# Patient Record
Sex: Female | Born: 1945
Health system: Southern US, Community
[De-identification: ages and names within clinical notes are randomized; demographics above are authoritative.]

## PROBLEM LIST (undated history)

## (undated) DIAGNOSIS — I10 Essential (primary) hypertension: Secondary | ICD-10-CM

## (undated) DIAGNOSIS — F419 Anxiety disorder, unspecified: Secondary | ICD-10-CM

## (undated) DIAGNOSIS — M1990 Unspecified osteoarthritis, unspecified site: Secondary | ICD-10-CM

## (undated) DIAGNOSIS — C801 Malignant (primary) neoplasm, unspecified: Secondary | ICD-10-CM

## (undated) DIAGNOSIS — K219 Gastro-esophageal reflux disease without esophagitis: Secondary | ICD-10-CM

## (undated) DIAGNOSIS — D649 Anemia, unspecified: Secondary | ICD-10-CM

## (undated) DIAGNOSIS — E119 Type 2 diabetes mellitus without complications: Secondary | ICD-10-CM

## (undated) DIAGNOSIS — Z8719 Personal history of other diseases of the digestive system: Secondary | ICD-10-CM

## (undated) DIAGNOSIS — N390 Urinary tract infection, site not specified: Secondary | ICD-10-CM

## (undated) HISTORY — PX: CHOLECYSTECTOMY: SHX55

## (undated) HISTORY — PX: DILATION AND CURETTAGE OF UTERUS: SHX78

## (undated) HISTORY — PX: WISDOM TOOTH EXTRACTION: SHX21

---

## 1999-07-10 ENCOUNTER — Encounter: Payer: Self-pay | Admitting: Emergency Medicine

## 1999-07-10 ENCOUNTER — Emergency Department (HOSPITAL_COMMUNITY): Admission: EM | Admit: 1999-07-10 | Discharge: 1999-07-10 | Payer: Self-pay | Admitting: Emergency Medicine

## 1999-08-20 ENCOUNTER — Emergency Department (HOSPITAL_COMMUNITY): Admission: EM | Admit: 1999-08-20 | Discharge: 1999-08-21 | Payer: Self-pay | Admitting: Emergency Medicine

## 1999-09-03 ENCOUNTER — Ambulatory Visit (HOSPITAL_COMMUNITY): Admission: RE | Admit: 1999-09-03 | Discharge: 1999-09-03 | Payer: Self-pay | Admitting: Obstetrics and Gynecology

## 1999-09-03 ENCOUNTER — Encounter (INDEPENDENT_AMBULATORY_CARE_PROVIDER_SITE_OTHER): Payer: Self-pay

## 2000-03-24 ENCOUNTER — Encounter: Payer: Self-pay | Admitting: *Deleted

## 2000-03-24 ENCOUNTER — Ambulatory Visit (HOSPITAL_COMMUNITY): Admission: RE | Admit: 2000-03-24 | Discharge: 2000-03-24 | Payer: Self-pay | Admitting: *Deleted

## 2000-07-14 ENCOUNTER — Emergency Department (HOSPITAL_COMMUNITY): Admission: EM | Admit: 2000-07-14 | Discharge: 2000-07-14 | Payer: Self-pay | Admitting: Emergency Medicine

## 2008-05-06 ENCOUNTER — Emergency Department (HOSPITAL_COMMUNITY): Admission: EM | Admit: 2008-05-06 | Discharge: 2008-05-06 | Payer: Self-pay | Admitting: *Deleted

## 2008-05-06 IMAGING — CR DG KNEE COMPLETE 4+V*R*
5 series · 5 of 5 positions shown · non-contrast
Comparison: None.

CLINICAL DATA: Fell on concrete, pain

RIGHT KNEE - COMPLETE 4+ VIEW

[t knee ap right]
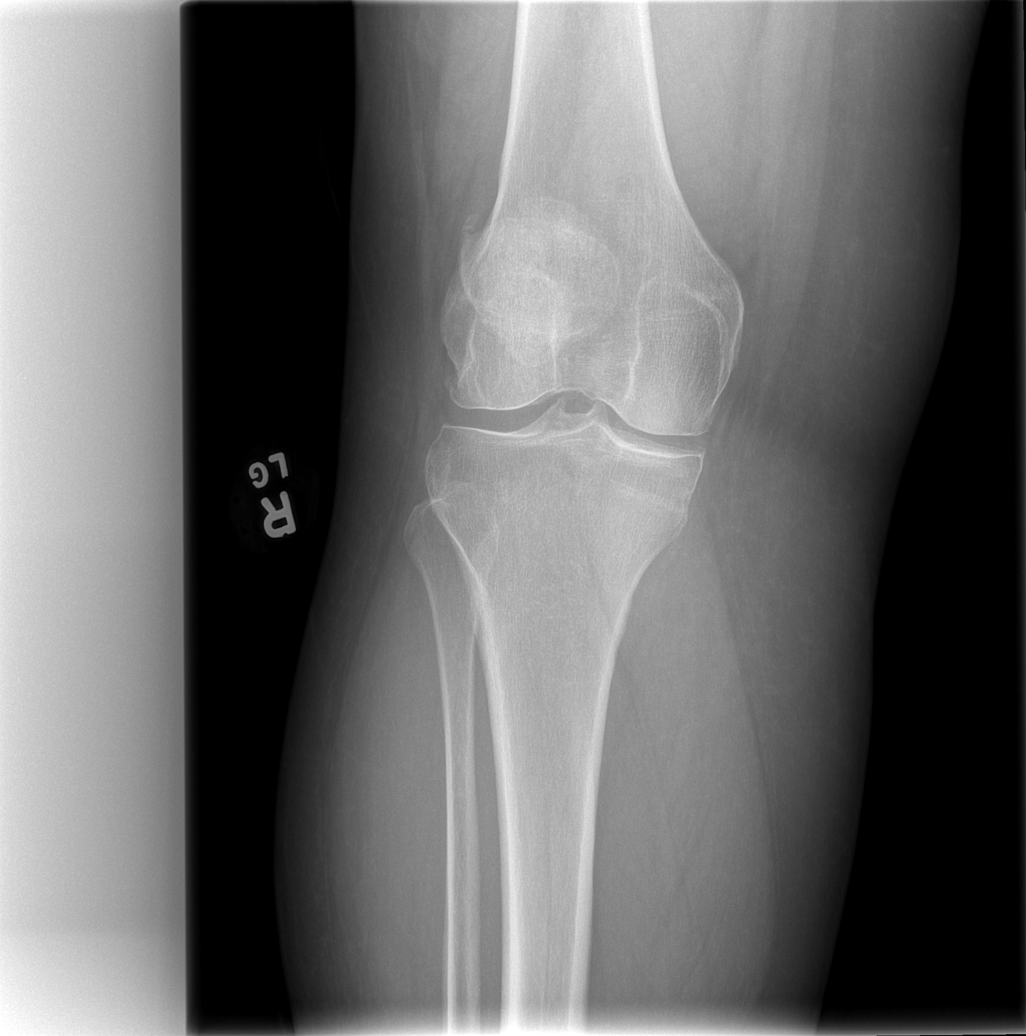

[t knee oblique right (1 of 2)]
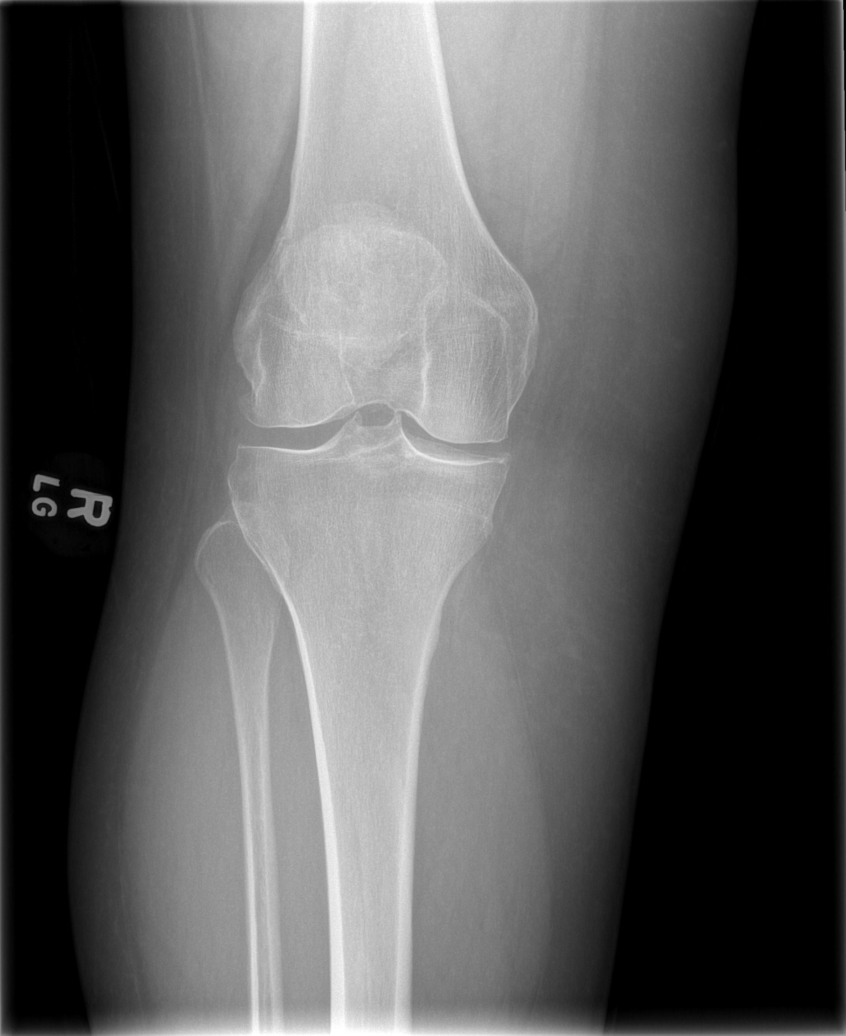

[t knee oblique right (2 of 2)]
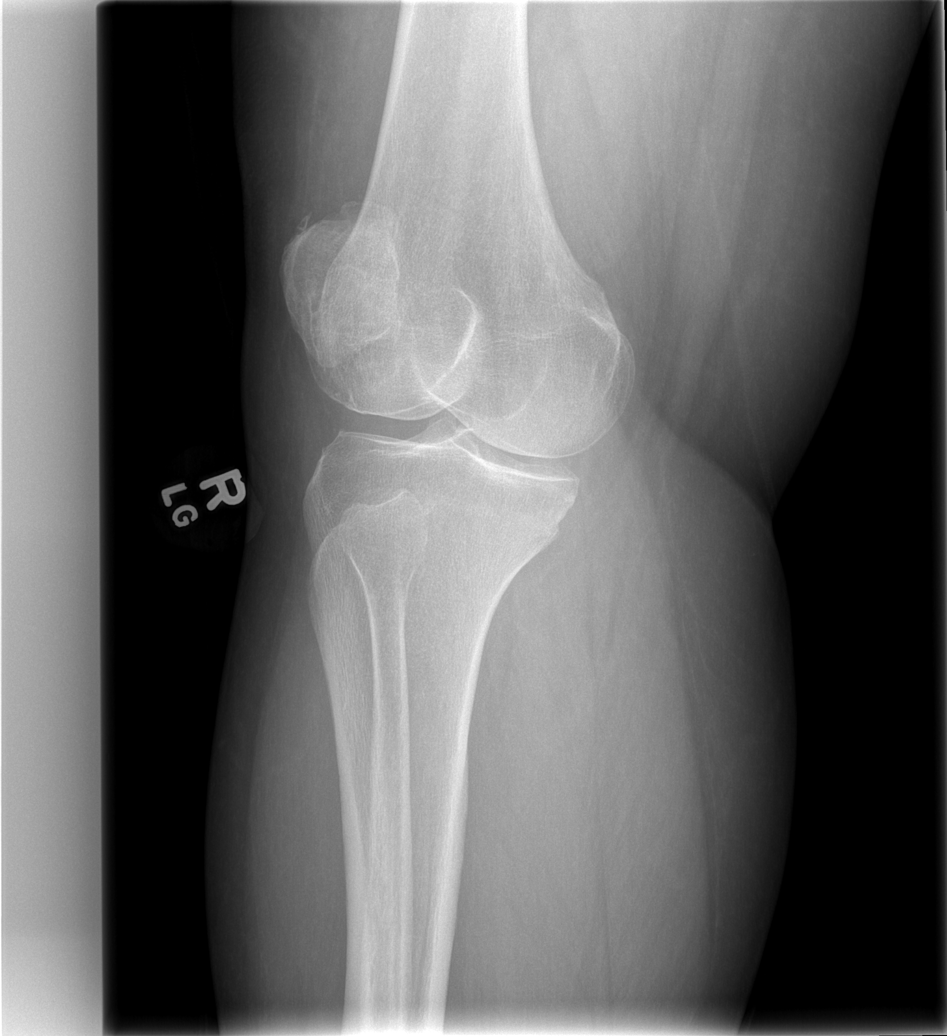

[t knee lat right (1 of 2)]
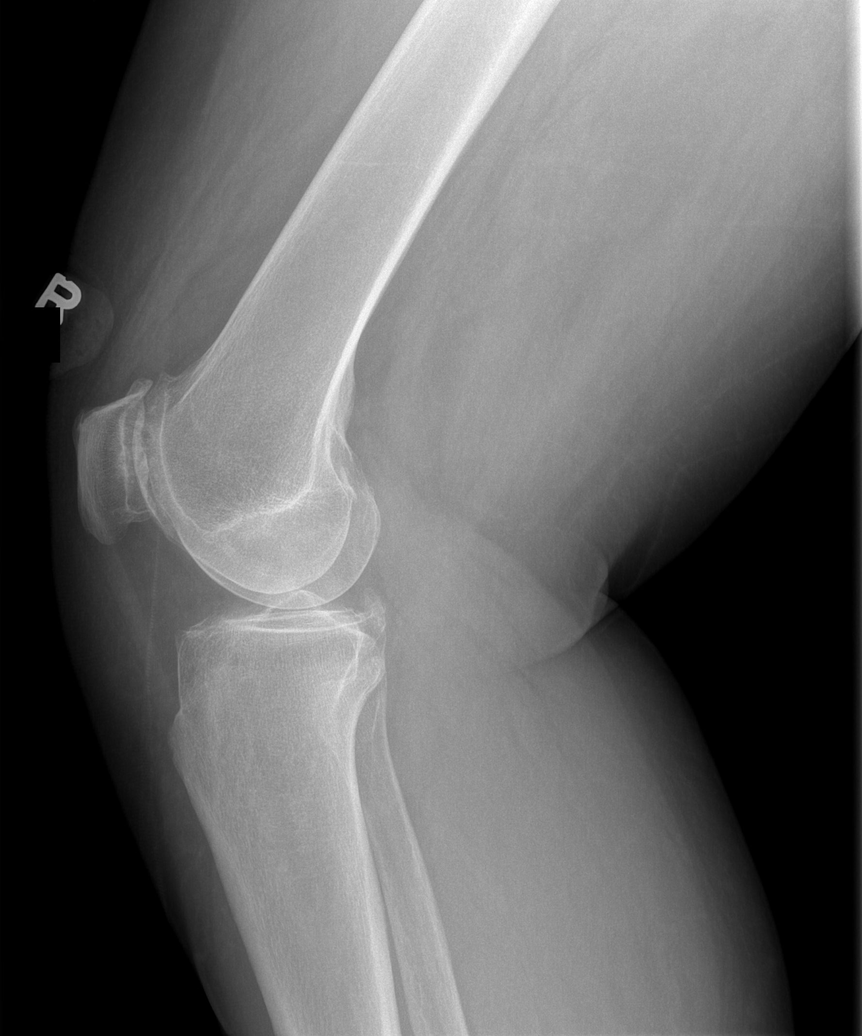

[t knee lat right (2 of 2)]
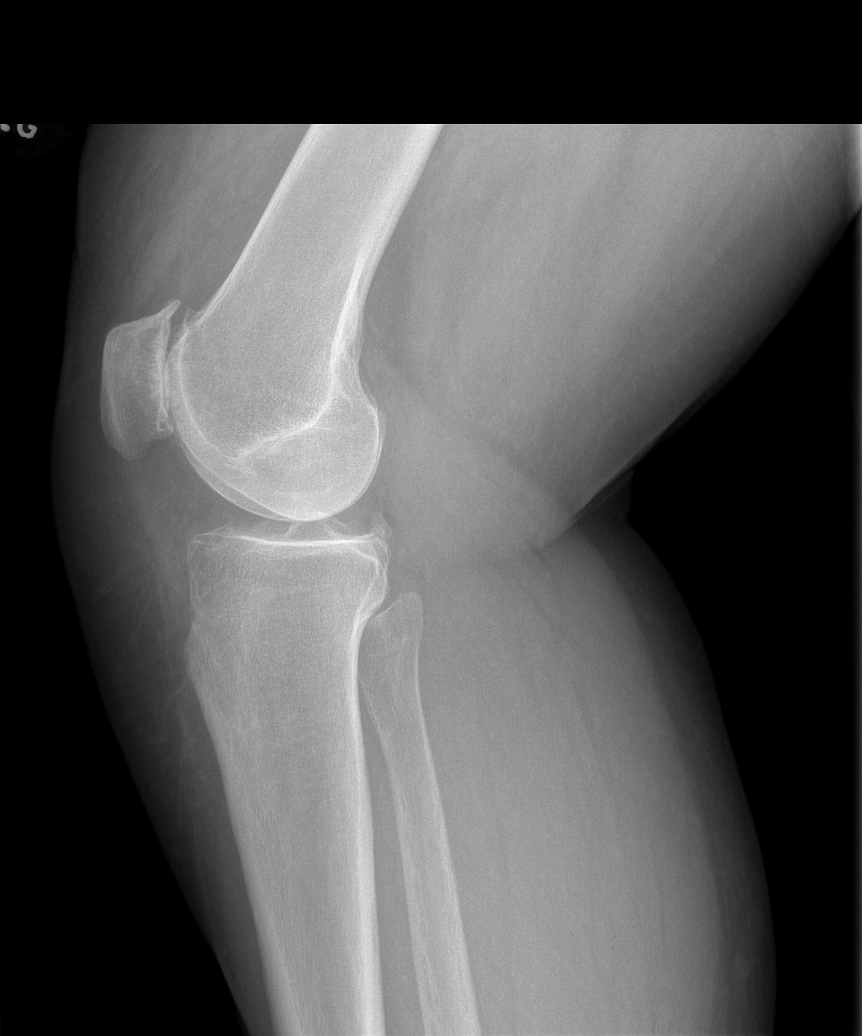

[5 of 5 positions shown; findings below may reference images not displayed]

FINDINGS: Advanced patellofemoral degenerative change.  Small
suprapatellar bursa effusion.  Mild medial compartment narrowing.
No visible fracture.
IMPRESSION: As above.  Small effusion.  Advanced degenerative change.  No
visible fracture.

## 2008-05-06 IMAGING — CR DG FOREARM 2V*L*
2 series · 2 of 2 positions shown · non-contrast
Comparison: None.

CLINICAL DATA: Fell on concrete, pain

LEFT FOREARM - 2 VIEW

[x forearm ap left]
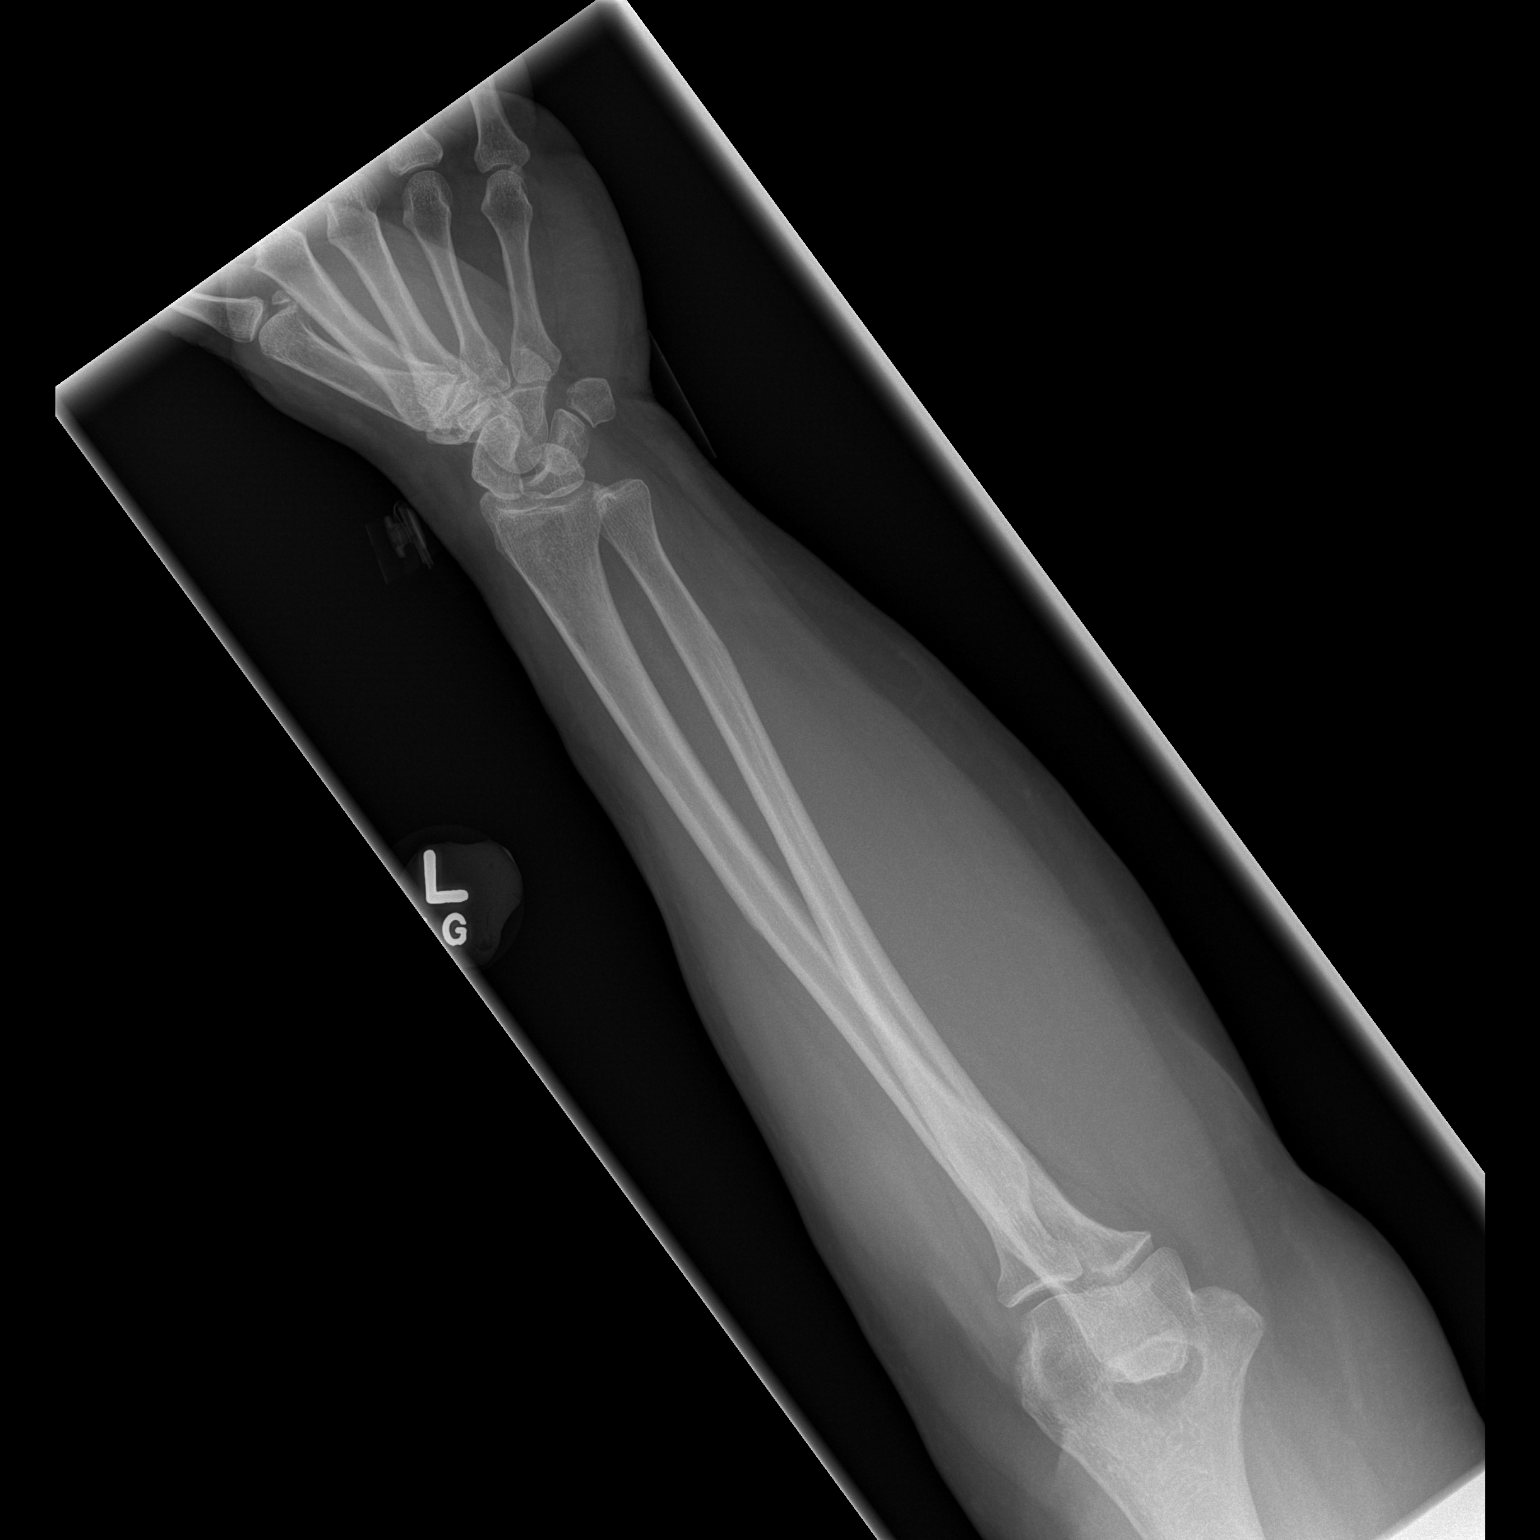

[x forearm lat left]
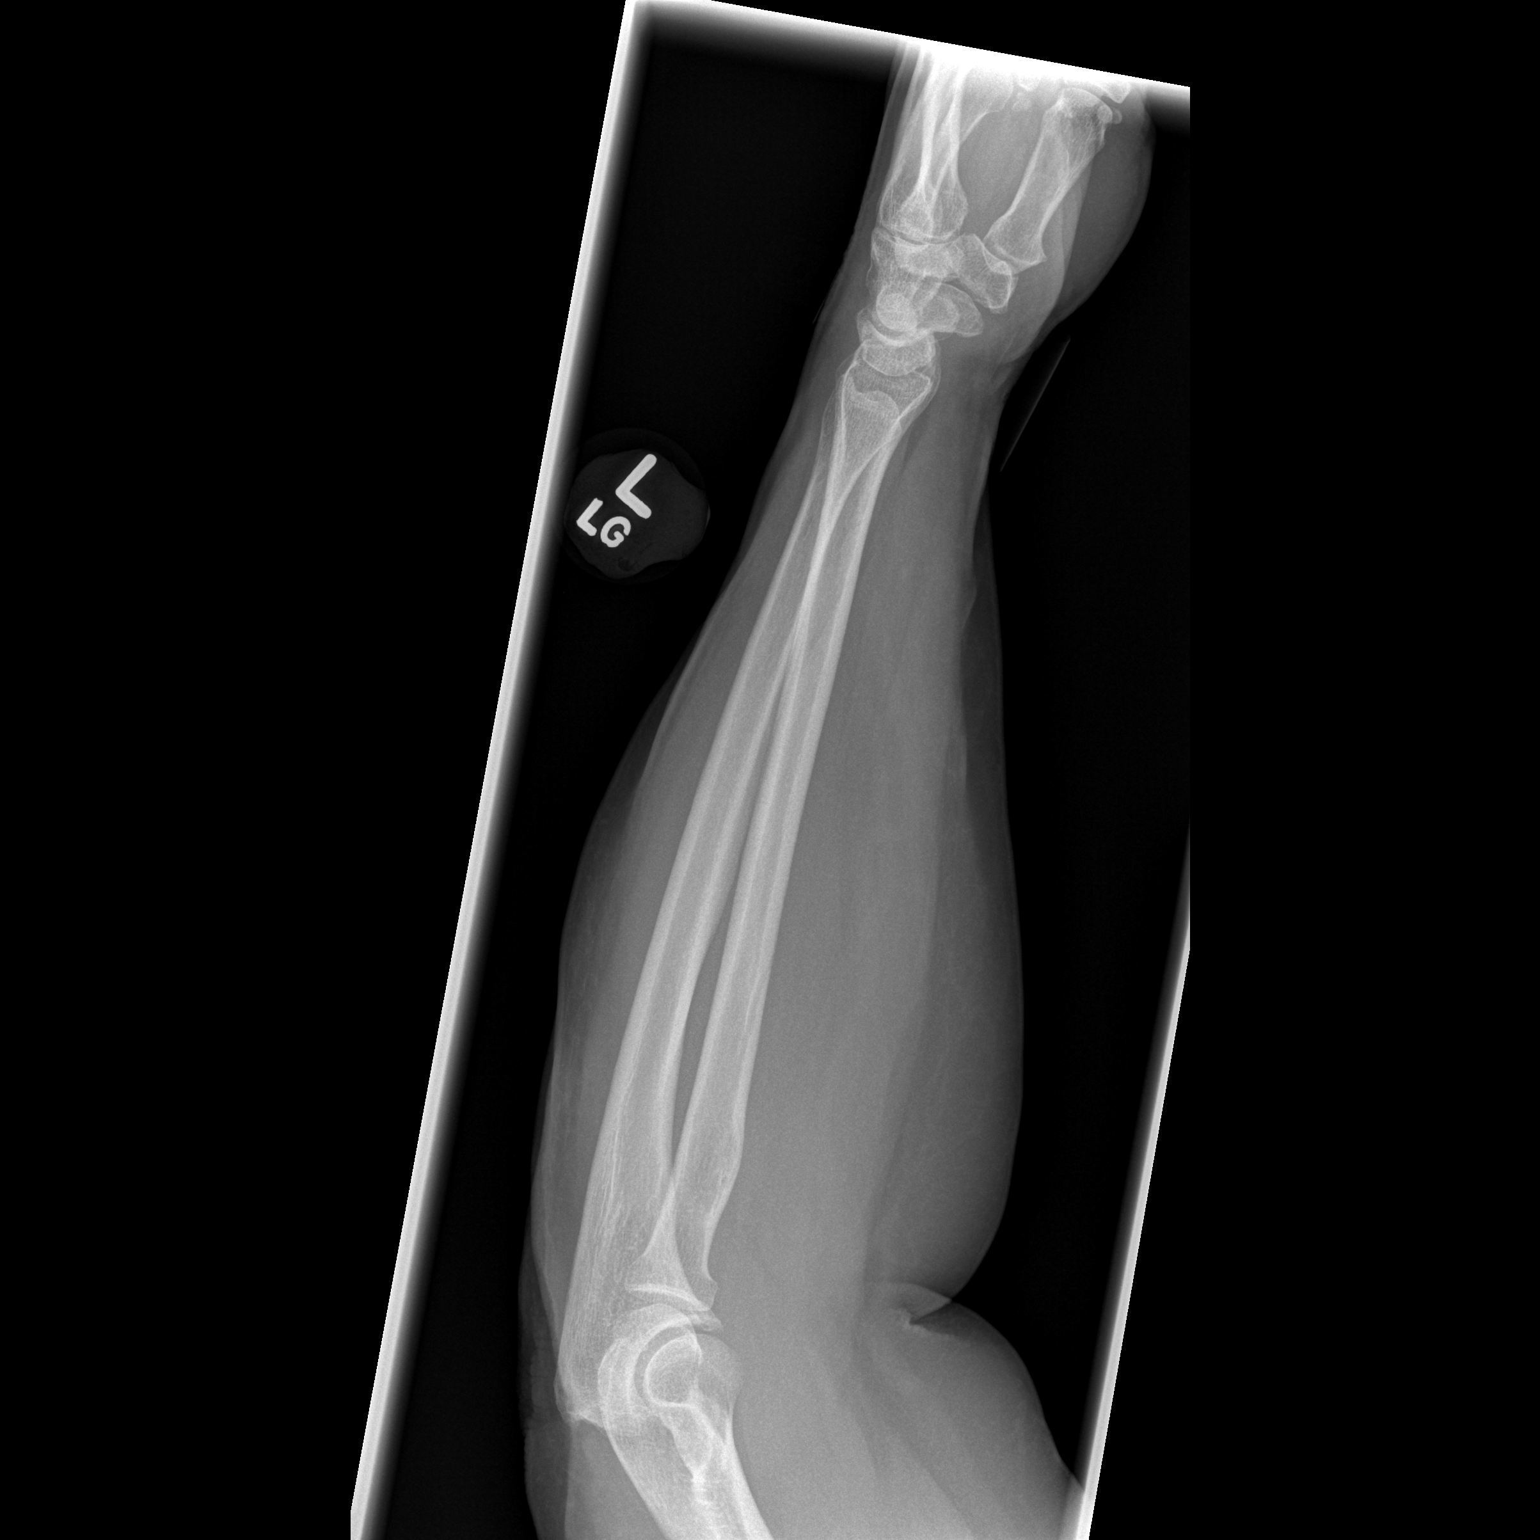

[2 of 2 positions shown; findings below may reference images not displayed]

FINDINGS: There is no evidence of fracture or dislocation.  There
is no evidence of arthropathy or other focal bony abnormality.
Soft tissue swelling is present without obvious foreign body.
IMPRESSION: No visible fracture.  Soft tissue swelling.

## 2010-06-05 NOTE — Op Note (Signed)
[facility]  Patient:    Meagan [name], Meagan [name]                     MRN: [id number] Proc. Date: 09/03/99 Adm. Date:  [id number] Disc. Date: [id number] Attending:  Lendon Colonel                           Operative Report  PREOPERATIVE DIAGNOSIS:       Menorrhagia with uterine fibroids.  POSTOPERATIVE DIAGNOSIS:      Menorrhagia with uterine fibroids.  OPERATION:                    Hysteroscopy with resection of two large                               submucosal myomas.  DESCRIPTION OF PROCEDURE:     The patient was placed in lithotomy position, prepped and draped in the usual fashion.  Cervix dilated quite easily, and the hysteroscope was inserted into a retroverted uterus.  Visualization of the uterus revealed two large fibroids, one anteriorly and one posteriorly.  These were resected without difficulty with the resectoscope and sent to the lab for study.  The uterus was then injected with a 30 cc ______ Pitressin solution. Meagan [name] tolerated this procedure well and was sent to recovery room in good condition. DD:  09/03/99 TD:  09/05/99 Job: [id number]

## 2011-11-22 DIAGNOSIS — N390 Urinary tract infection, site not specified: Secondary | ICD-10-CM

## 2011-11-22 HISTORY — DX: Urinary tract infection, site not specified: N39.0

## 2011-11-24 ENCOUNTER — Encounter (HOSPITAL_COMMUNITY): Payer: Self-pay | Admitting: Pharmacist

## 2011-11-25 ENCOUNTER — Encounter (HOSPITAL_COMMUNITY): Payer: Self-pay

## 2011-11-25 ENCOUNTER — Encounter (HOSPITAL_COMMUNITY)
Admission: RE | Admit: 2011-11-25 | Discharge: 2011-11-25 | Disposition: A | Discharge status: Home / Self Care | Payer: Managed Care, Other (non HMO) | Source: Ambulatory Visit | Attending: Obstetrics and Gynecology | Admitting: Obstetrics and Gynecology

## 2011-11-25 HISTORY — DX: Personal history of other diseases of the digestive system: Z87.19

## 2011-11-25 HISTORY — DX: Urinary tract infection, site not specified: N39.0

## 2011-11-25 HISTORY — DX: Essential (primary) hypertension: I10

## 2011-11-25 HISTORY — DX: Gastro-esophageal reflux disease without esophagitis: K21.9

## 2011-11-25 HISTORY — DX: Anxiety disorder, unspecified: F41.9

## 2011-11-25 HISTORY — DX: Type 2 diabetes mellitus without complications: E11.9

## 2011-11-25 HISTORY — DX: Anemia, unspecified: D64.9

## 2011-11-25 HISTORY — DX: Unspecified osteoarthritis, unspecified site: M19.90

## 2011-11-25 LAB — COMPREHENSIVE METABOLIC PANEL
AST: 16 U/L (ref 0–37)
Albumin: 3.6 g/dL (ref 3.5–5.2)
Alkaline Phosphatase: 80 U/L (ref 39–117)
BUN: 22 mg/dL (ref 6–23)
CO2: 26 mEq/L (ref 19–32)
Chloride: 99 mEq/L (ref 96–112)
Creatinine, Ser: 0.82 mg/dL (ref 0.50–1.10)
GFR calc non Af Amer: 73 mL/min — ABNORMAL LOW (ref >90)
Potassium: 3.5 mEq/L (ref 3.5–5.1)
Total Bilirubin: 0.5 mg/dL (ref 0.3–1.2)

## 2011-11-25 LAB — CBC
HCT: 42.7 % (ref 36.0–46.0)
MCV: 82.1 fL (ref 78.0–100.0)
RBC: 5.2 MIL/uL — ABNORMAL HIGH (ref 3.87–5.11)
WBC: 11.7 10*3/uL — ABNORMAL HIGH (ref 4.0–10.5)

## 2011-11-25 NOTE — Pre-Procedure Instructions (Signed)
Dr Brayton Caves informed that CBG 175, EKG ok.  No orders given.

## 2011-11-25 NOTE — Patient Instructions (Addendum)
   Your procedure is scheduled on: Tues., Nov 12  Enter through the Main Entrance of [facility] at: 830 am Pick up the phone at the desk and dial [phone] and inform us of your arrival.  Please call this number if you have any problems the morning of surgery: [phone]  Remember: Do not eat food after midnight:  Monday Do not drink clear liquids after:  Monday Take these medicines the morning of surgery with a SIP OF WATER: Withhold Metformin Monday night dose, take zantac with sip of water DOS.  All other meds are taken at night - will take Monday night.  Do not wear jewelry, make-up, or FINGER nail polish No metal in your hair or on your body. Do not wear lotions, powders, perfumes. You may wear deodorant.  Please use your CHG wash as directed prior to surgery.  Do not shave anywhere for at least 12 hours prior to first CHG shower.  Do not bring valuables to the hospital. Contacts, dentures or bridgework may not be worn into surgery.  Patients discharged on the day of surgery will not be allowed to drive home.  Home with husband Meagan [name]  cell [phone]/

## 2011-11-29 NOTE — H&P (Signed)
Meagan [name] is an 66 y.o. female.   Chief Complaint: 66 yo G1 P1 with vaginal bleeding since about June 2013.  HPI: Pt with previous frightening experience at gynecologist, and elects D & C over office biopsy.  Past Medical History  Diagnosis Date  . SVD (spontaneous vaginal delivery)     x 1  . UTI (lower urinary tract infection) 11/22/11    started abx on 11/24/11  . Diabetes mellitus without complication   . Hypertension   . Anxiety     no meds  . GERD (gastroesophageal reflux disease)     tx zantac  . H/O hiatal hernia   . Arthritis     knees  . Anemia     hx     Past Surgical History  Procedure Date  . Dilation and curettage of uterus   . Wisdom tooth extraction   . Cholecystectomy     No family history on file. Social History:  reports that she has never smoked. She has never used smokeless tobacco. She reports that she does not drink alcohol or use illicit drugs.  Allergies:  Allergies  Allergen Reactions  . Penicillins Swelling and Other (See Comments)    hallucinations  . Sinutab Sinus Max St (Phenylephrine-Acetaminophen) Swelling  . Valacyclovir Other (See Comments)    Confusion    No prescriptions prior to admission    No results found for this or any previous visit (from the past 48 hour(s)). No results found.  Review of Systems  All other systems reviewed and are negative.    Height 5' 2.25" (1.581 m), weight 83.462 kg (184 lb). Physical Exam  Constitutional: She is oriented to person, place, and time. She appears well-developed and well-nourished.  HENT:  Head: Normocephalic and atraumatic.  Eyes: Conjunctivae normal are normal.  Neck: Neck supple. No thyromegaly present.  Cardiovascular: Normal rate, regular rhythm and normal heart sounds.   Respiratory: Effort normal and breath sounds normal.  GI: Soft. Bowel sounds are normal.  Genitourinary: Vagina normal and uterus normal.  Musculoskeletal: Normal range of motion.  Neurological:  She is alert and oriented to person, place, and time.  Skin: Skin is warm and dry.  Psychiatric: She has a normal mood and affect. Her behavior is normal. Thought content normal.     Assessment/Plan 66 yo with post meno bleeding, for hysteroscopy, D & C.  Rai Severns P 11/29/2011, 10:57 AM

## 2011-11-30 ENCOUNTER — Ambulatory Visit (HOSPITAL_COMMUNITY)
Admission: RE | Admit: 2011-11-30 | Discharge: 2011-11-30 | Disposition: A | Discharge status: Home / Self Care | Payer: Managed Care, Other (non HMO) | Source: Ambulatory Visit | Attending: Obstetrics and Gynecology | Admitting: Obstetrics and Gynecology

## 2011-11-30 ENCOUNTER — Encounter (HOSPITAL_COMMUNITY): Payer: Self-pay | Admitting: Anesthesiology

## 2011-11-30 ENCOUNTER — Ambulatory Visit (HOSPITAL_COMMUNITY): Payer: Managed Care, Other (non HMO) | Admitting: Anesthesiology

## 2011-11-30 ENCOUNTER — Encounter (HOSPITAL_COMMUNITY): Payer: Self-pay | Admitting: *Deleted

## 2011-11-30 ENCOUNTER — Encounter (HOSPITAL_COMMUNITY)
Admission: RE | Disposition: A | Discharge status: Home / Self Care | Payer: Self-pay | Source: Ambulatory Visit | Attending: Obstetrics and Gynecology

## 2011-11-30 DIAGNOSIS — D250 Submucous leiomyoma of uterus: Secondary | ICD-10-CM | POA: Insufficient documentation

## 2011-11-30 DIAGNOSIS — Z01812 Encounter for preprocedural laboratory examination: Secondary | ICD-10-CM | POA: Insufficient documentation

## 2011-11-30 DIAGNOSIS — N950 Postmenopausal bleeding: Secondary | ICD-10-CM | POA: Insufficient documentation

## 2011-11-30 DIAGNOSIS — Z01818 Encounter for other preprocedural examination: Secondary | ICD-10-CM | POA: Insufficient documentation

## 2011-11-30 HISTORY — PX: DILATATION & CURRETTAGE/HYSTEROSCOPY WITH RESECTOCOPE: SHX5572

## 2011-11-30 LAB — GLUCOSE, CAPILLARY: Glucose-Capillary: 182 mg/dL — ABNORMAL HIGH (ref 70–99)

## 2011-11-30 SURGERY — DILATATION & CURETTAGE/HYSTEROSCOPY WITH RESECTOCOPE
Anesthesia: General | Site: Uterus | Wound class: Clean Contaminated

## 2011-11-30 MED ORDER — KETOROLAC TROMETHAMINE 30 MG/ML IJ SOLN
INTRAMUSCULAR | Status: AC
  Filled 2011-11-30: qty 1

## 2011-11-30 MED ORDER — INSULIN ASPART 100 UNIT/ML ~~LOC~~ SOLN
2.0000 [IU] | Freq: Once | SUBCUTANEOUS | Status: AC
  Administered 2011-11-30: 2 [IU] via SUBCUTANEOUS

## 2011-11-30 MED ORDER — FAMOTIDINE IN NACL 20-0.9 MG/50ML-% IV SOLN
20.0000 mg | Freq: Two times a day (BID) | INTRAVENOUS | Status: DC
  Administered 2011-11-30: 20 mg via INTRAVENOUS
  Filled 2011-11-30 (×4): qty 50

## 2011-11-30 MED ORDER — MIDAZOLAM HCL 2 MG/2ML IJ SOLN: 0.5000 mg | Freq: Once | INTRAMUSCULAR | Status: DC | PRN

## 2011-11-30 MED ORDER — LIDOCAINE HCL (CARDIAC) 20 MG/ML IV SOLN
INTRAVENOUS | Status: DC | PRN
  Administered 2011-11-30 (×2): 50 mg via INTRAVENOUS

## 2011-11-30 MED ORDER — PROMETHAZINE HCL 25 MG/ML IJ SOLN: 6.2500 mg | INTRAMUSCULAR | Status: DC | PRN

## 2011-11-30 MED ORDER — ONDANSETRON HCL 4 MG/2ML IJ SOLN
INTRAMUSCULAR | Status: DC | PRN
  Administered 2011-11-30: 4 mg via INTRAVENOUS

## 2011-11-30 MED ORDER — OXYCODONE HCL 5 MG PO TABS: 5.0000 mg | ORAL_TABLET | ORAL | Status: DC | PRN

## 2011-11-30 MED ORDER — KETOROLAC TROMETHAMINE 30 MG/ML IJ SOLN
INTRAMUSCULAR | Status: DC | PRN
  Administered 2011-11-30: 30 mg via INTRAVENOUS

## 2011-11-30 MED ORDER — GLYCINE 1.5 % IR SOLN
Status: DC | PRN
  Administered 2011-11-30: 3000 mL

## 2011-11-30 MED ORDER — MIDAZOLAM HCL 5 MG/5ML IJ SOLN
INTRAMUSCULAR | Status: DC | PRN
  Administered 2011-11-30: 2 mg via INTRAVENOUS

## 2011-11-30 MED ORDER — ONDANSETRON HCL 4 MG/2ML IJ SOLN: 4.0000 mg | Freq: Four times a day (QID) | INTRAMUSCULAR | Status: DC | PRN

## 2011-11-30 MED ORDER — PROPOFOL 10 MG/ML IV EMUL
INTRAVENOUS | Status: DC | PRN
  Administered 2011-11-30: 200 mg via INTRAVENOUS

## 2011-11-30 MED ORDER — INSULIN ASPART 100 UNIT/ML ~~LOC~~ SOLN
SUBCUTANEOUS | Status: AC
  Administered 2011-11-30: 2 [IU] via SUBCUTANEOUS
  Filled 2011-11-30: qty 1

## 2011-11-30 MED ORDER — KETOROLAC TROMETHAMINE 30 MG/ML IJ SOLN: 15.0000 mg | Freq: Once | INTRAMUSCULAR | Status: DC | PRN

## 2011-11-30 MED ORDER — FENTANYL CITRATE 0.05 MG/ML IJ SOLN
INTRAMUSCULAR | Status: DC | PRN
  Administered 2011-11-30: 100 ug via INTRAVENOUS

## 2011-11-30 MED ORDER — PROPOFOL 10 MG/ML IV EMUL
INTRAVENOUS | Status: AC
  Filled 2011-11-30: qty 20

## 2011-11-30 MED ORDER — ONDANSETRON HCL 4 MG/2ML IJ SOLN
INTRAMUSCULAR | Status: AC
  Filled 2011-11-30: qty 2

## 2011-11-30 MED ORDER — LACTATED RINGERS IV SOLN
INTRAVENOUS | Status: DC
  Administered 2011-11-30 (×3): via INTRAVENOUS

## 2011-11-30 MED ORDER — MEPERIDINE HCL 25 MG/ML IJ SOLN: 6.2500 mg | INTRAMUSCULAR | Status: DC | PRN

## 2011-11-30 MED ORDER — INSULIN REGULAR HUMAN 100 UNIT/ML IJ SOLN: 2.0000 [IU] | Freq: Once | INTRAMUSCULAR | Status: DC

## 2011-11-30 MED ORDER — LIDOCAINE HCL 1 % IJ SOLN
INTRAMUSCULAR | Status: DC | PRN
  Administered 2011-11-30: 20 mL

## 2011-11-30 MED ORDER — LIDOCAINE HCL (CARDIAC) 20 MG/ML IV SOLN
INTRAVENOUS | Status: AC
  Filled 2011-11-30: qty 5

## 2011-11-30 MED ORDER — SODIUM CHLORIDE 0.9 % IV SOLN: 250.0000 mL | INTRAVENOUS | Status: DC | PRN

## 2011-11-30 MED ORDER — FENTANYL CITRATE 0.05 MG/ML IJ SOLN: 25.0000 ug | INTRAMUSCULAR | Status: DC | PRN

## 2011-11-30 MED ORDER — FENTANYL CITRATE 0.05 MG/ML IJ SOLN
INTRAMUSCULAR | Status: AC
  Filled 2011-11-30: qty 2

## 2011-11-30 MED ORDER — MIDAZOLAM HCL 2 MG/2ML IJ SOLN
INTRAMUSCULAR | Status: AC
  Filled 2011-11-30: qty 2

## 2011-11-30 SURGICAL SUPPLY — 20 items
CANISTER SUCTION 2500CC (MISCELLANEOUS) ×3 IMPLANT
CATH ROBINSON RED A/P 16FR (CATHETERS) ×3 IMPLANT
CONTAINER PREFILL 10% NBF 60ML (FORM) ×4 IMPLANT
CORD ACTIVE DISPOSABLE (ELECTRODE) ×1
CORD ELECTRO ACTIVE DISP (ELECTRODE) ×1 IMPLANT
DRESSING TELFA 8X3 (GAUZE/BANDAGES/DRESSINGS) ×3 IMPLANT
ELECT LOOP GYNE PRO 24FR (CUTTING LOOP) ×3
ELECT REM PT RETURN 9FT ADLT (ELECTROSURGICAL) ×3
ELECT VAPORTRODE GRVD BAR (ELECTRODE) IMPLANT
ELECTRODE LOOP GYNE PRO 24FR (CUTTING LOOP) ×1 IMPLANT
ELECTRODE REM PT RTRN 9FT ADLT (ELECTROSURGICAL) ×1 IMPLANT
GLOVE BIOGEL PI IND STRL 7.0 (GLOVE) ×4 IMPLANT
GLOVE BIOGEL PI INDICATOR 7.0 (GLOVE) ×2
GLOVE ECLIPSE 6.5 STRL STRAW (GLOVE) ×3 IMPLANT
GOWN STRL REIN XL XLG (GOWN DISPOSABLE) ×9 IMPLANT
LOOP ANGLED CUTTING 22FR (CUTTING LOOP) IMPLANT
PACK HYSTEROSCOPY LF (CUSTOM PROCEDURE TRAY) ×3 IMPLANT
PAD OB MATERNITY 4.3X12.25 (PERSONAL CARE ITEMS) ×3 IMPLANT
TOWEL OR 17X24 6PK STRL BLUE (TOWEL DISPOSABLE) ×6 IMPLANT
WATER STERILE IRR 1000ML POUR (IV SOLUTION) ×3 IMPLANT

## 2011-11-30 NOTE — Anesthesia Postprocedure Evaluation (Signed)
Anesthesia Post Note  Patient: Meagan [name]  Procedure(s) Performed: Procedure(s) (LRB): DILATATION & CURETTAGE/HYSTEROSCOPY WITH RESECTOCOPE (N/A)  Anesthesia type: General  Patient location: PACU  Post pain: Pain level controlled  Post assessment: Post-op Vital signs reviewed  Last Vitals:  Filed Vitals:   11/30/11 1105  BP: 142/60  Pulse: 87  Temp: 36.3 C  Resp: 18    Post vital signs: Reviewed  Level of consciousness: sedated  Complications: No apparent anesthesia complicationsfj

## 2011-11-30 NOTE — Transfer of Care (Signed)
Immediate Anesthesia Transfer of Care Note  Patient: Meagan [name]  Procedure(s) Performed: Procedure(s) (LRB) with comments: DILATATION & CURETTAGE/HYSTEROSCOPY WITH RESECTOCOPE (N/A)  Patient Location: PACU  Anesthesia Type:General  Level of Consciousness: awake, alert  and oriented  Airway & Oxygen Therapy: Patient Spontanous Breathing and Patient connected to nasal cannula oxygen  Post-op Assessment: Report given to PACU RN and Post -op Vital signs reviewed and stable  Post vital signs: Reviewed and stable  Complications: No apparent anesthesia complications

## 2011-11-30 NOTE — Anesthesia Procedure Notes (Addendum)
Performed by: Isabella Bowens R   Procedure Name: LMA Insertion Date/Time: 11/30/2011 11:10 AM Performed by: Gertie Fey Pre-anesthesia Checklist: Patient identified, Timeout performed, Emergency Drugs available, Suction available and Patient being monitored Patient Re-evaluated:Patient Re-evaluated prior to inductionOxygen Delivery Method: Circle system utilized Preoxygenation: Pre-oxygenation with 100% oxygen Intubation Type: IV induction LMA: LMA inserted LMA Size: 4.0 Grade View: Grade III Number of attempts: 1 Placement Confirmation: positive ETCO2 and breath sounds checked- equal and bilateral Tube secured with: Tape Dental Injury: Teeth and Oropharynx as per pre-operative assessment

## 2011-11-30 NOTE — Interval H&P Note (Signed)
History and Physical Interval Note:  11/30/2011 9:50 AM  Meagan [name]  has presented today for surgery, with the diagnosis of Post Menopausal Bleeding  The various methods of treatment have been discussed with the patient and family. After consideration of risks, benefits and other options for treatment, the patient has consented to  Procedure(s) (LRB) with comments: DILATATION AND CURETTAGE /HYSTEROSCOPY (N/A) as a surgical intervention .  The patient's history has been reviewed, patient examined, no change in status, stable for surgery.  I have reviewed the patient's chart and labs.  Questions were answered to the patient's satisfaction.     ROMINE,CYNTHIA P

## 2011-11-30 NOTE — Anesthesia Preprocedure Evaluation (Addendum)
Anesthesia Evaluation  Patient identified by MRN, date of birth, ID band Patient awake    Reviewed: Allergy & Precautions, H&P , Patient's Chart, lab work & pertinent test results, reviewed documented beta blocker date and time   History of Anesthesia Complications Negative for: history of anesthetic complications  Airway Mallampati: III TM Distance: >3 FB Neck ROM: full    Dental No notable dental hx.    Pulmonary neg pulmonary ROS,  breath sounds clear to auscultation  Pulmonary exam normal       Cardiovascular Exercise Tolerance: Good hypertension, negative cardio ROS  Rhythm:regular Rate:Normal     Neuro/Psych negative neurological ROS  negative psych ROS   GI/Hepatic negative GI ROS, Neg liver ROS, hiatal hernia, GERD-  Controlled,  Endo/Other  negative endocrine ROSdiabetes  Renal/GU negative Renal ROS     Musculoskeletal   Abdominal   Peds  Hematology negative hematology ROS (+)   Anesthesia Other Findings SVD (spontaneous vaginal delivery)   x 1 UTI (lower urinary tract infection) 11/22/11 started abx on 11/24/11    Diabetes mellitus without complication     Hypertension        Anxiety   no meds GERD (gastroesophageal reflux disease)   tx zantac    H/O hiatal hernia     Arthritis   knees    Anemia   hx              Reproductive/Obstetrics negative OB ROS                           Anesthesia Physical Anesthesia Plan  ASA: III  Anesthesia Plan: General LMA   Post-op Pain Management:    Induction:   Airway Management Planned:   Additional Equipment:   Intra-op Plan:   Post-operative Plan:   Informed Consent: I have reviewed the patients History and Physical, chart, labs and discussed the procedure including the risks, benefits and alternatives for the proposed anesthesia with the patient or authorized representative who has indicated his/her understanding and acceptance.    Dental Advisory Given  Plan Discussed with: CRNA and Surgeon  Anesthesia Plan Comments:        Anesthesia Quick Evaluation

## 2011-11-30 NOTE — Op Note (Signed)
Preoperative diagnosis: Postmenopausal bleeding Postoperative diagnosis: Same, large submucous fibroids, path pending Procedure: Diagnostic hysteroscopy: Operative hysteroscopy with dissection of submucous myomas Anesthesia: Gen. by LMA Estimated blood loss: Minimal Glycine deficit: 170 cc Complications: None Procedure: The patient was taken to the operating room and after induction of adequate general anesthesia, was placed in the low dorsolithotomy position and prepped and draped in usual fashion.  Posterior weighted and anterior sims retractors were placed, and the cervix was grasped on the anterior lip with a single-tooth tenaculum.  The uterus sounded to 6 cm.  The cervix was dilated to a #21 Shawnie Pons.  The diagnostic hysteroscope was introduced using glycine as a distention medium with the pressure pump set at 80 mm of mercury.  Upon hysteroscopy there were 2 rather large submucous fibroids noted.  Scope was withdrawn and changed out for an operative scope.  The cervix was dilated to #31 Shawnie Pons, and the operative scope was introduced with a single loop cautery.  Cautery was used to resect the submucous myomas in multiple pieces.  Gentle sharp curettage was then done.  The specimen was sent to pathology.  The instruments were removed from the vagina, and the procedure was terminated.  The patient tolerated it well, and went in satisfactory condition to post anesthesia recovery.  Sponge needle and instrument counts were correct x3.

## 2011-12-01 ENCOUNTER — Encounter (HOSPITAL_COMMUNITY): Payer: Self-pay | Admitting: Obstetrics and Gynecology

## 2011-12-08 ENCOUNTER — Ambulatory Visit: Payer: Managed Care, Other (non HMO) | Attending: Gynecologic Oncology | Admitting: Gynecologic Oncology

## 2011-12-08 ENCOUNTER — Encounter: Payer: Self-pay | Admitting: Gynecologic Oncology

## 2011-12-08 VITALS — BP 134/62 | HR 80 | Temp 98.4°F | Resp 16 | Ht 62.68 in | Wt 189.0 lb

## 2011-12-08 DIAGNOSIS — I152 Hypertension secondary to endocrine disorders: Secondary | ICD-10-CM | POA: Insufficient documentation

## 2011-12-08 DIAGNOSIS — I10 Essential (primary) hypertension: Secondary | ICD-10-CM | POA: Insufficient documentation

## 2011-12-08 DIAGNOSIS — C549 Malignant neoplasm of corpus uteri, unspecified: Secondary | ICD-10-CM | POA: Insufficient documentation

## 2011-12-08 DIAGNOSIS — Z8542 Personal history of malignant neoplasm of other parts of uterus: Secondary | ICD-10-CM | POA: Insufficient documentation

## 2011-12-08 DIAGNOSIS — E119 Type 2 diabetes mellitus without complications: Secondary | ICD-10-CM

## 2011-12-08 DIAGNOSIS — C541 Malignant neoplasm of endometrium: Secondary | ICD-10-CM

## 2011-12-08 NOTE — Patient Instructions (Signed)
Please keep your pre-op visit.

## 2011-12-08 NOTE — Progress Notes (Signed)
Consult Note: Gyn-Onc  Meagan [name] 66 y.o. female  CC:  Chief Complaint  Patient presents with  . Endo ca    New patient    HPI: Patient is seen today in consultation at the request of Dr. Romine for newly diagnosed endometrial cancer.    Should is a 66-year-old gravida 1 para 1 has been having some vaginal bleeding sincerely part of 2013. She states the bleeding became more pronounced and was bright red starting in June of 2013. It was scant it was a sister with a slight amount of cramping. She has a history of her last menstrual period being in her 50s. She states her cycles just stopped she never had any vasomotor symptoms. She never took any hormone replacement therapy. She had a prior difficult experience with a gynecologist therefore she wished to proceed with a D&C rather than undergoing an endometrial biopsy in the office.   She subsequently underwent a hysteroscopy D&C on November 12 with Dr. Romine. Operative findings included a large submucosal fibroid. The uterus sounded to 6 cm. Final pathology was consistent with a grade 1 endometrioid adenocarcinoma. It is for this reason that she came in to see us today.  She states that since her procedure she's really only had a bit of pink discharge and less bleeding than she had before. She denies any pain.  Review of Systems: She has a chest pain, shortness of breath, nausea, vomiting, fevers, chills. She has to use to flight of stairs or house and cons without any shortness of breath. She has been on metformin for the past 3 months. She states she checks her sugars are in the 200s but of course vary depending on what she has eaten. She denies any unintentional weight loss weight gain any headaches or visual changes. 10 point review of systems is otherwise negative.  Current Meds:  Outpatient Encounter Prescriptions as of 12/08/2011  Medication Sig Dispense Refill  . doxazosin (CARDURA) 2 MG tablet Take 2 mg by mouth at bedtime.       . metFORMIN (GLUCOPHAGE) 500 MG tablet Take 500 mg by mouth daily with breakfast.      . ranitidine (ZANTAC) 300 MG tablet Take 300 mg by mouth 2 (two) times daily.      . triamterene-hydrochlorothiazide (MAXZIDE-25) 37.5-25 MG per tablet Take 1 tablet by mouth daily.        Allergy:  Allergies  Allergen Reactions  . Penicillins Swelling and Other (See Comments)    hallucinations  . Shrimp (Shellfish Allergy) Nausea And Vomiting  . Sinutab Sinus Max St (Phenylephrine-Acetaminophen) Swelling  . Valacyclovir Other (See Comments)    Confusion    Social Hx:  Married. 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 She has a son who is 39 and a grandson who is 4. History   Social History  . Marital Status: Married    Spouse Name: N/A    Number of Children: N/A  . Years of Education: N/A   Occupational History  . Not on file.   Social History Main Topics  . Smoking status: Never Smoker   . Smokeless tobacco: Never Used  . Alcohol Use: No  . Drug Use: No  . Sexually Active: Yes    Birth Control/ Protection: None     Comment: husband had a vasectomy   Other Topics Concern  . Not on file   Social History Narrative  . No narrative on file    Past Surgical   Hx: Cholecystectomy was via laparotomy. Past Surgical History  Procedure Date  . Dilation and curettage of uterus   . Wisdom tooth extraction   . Cholecystectomy   . Dilatation & currettage/hysteroscopy with resectocope 11/30/2011    Procedure: DILATATION & CURETTAGE/HYSTEROSCOPY WITH RESECTOCOPE;  Surgeon: Cynthia P Romine, MD;  Location: [facility];  Service: Gynecology;  Laterality: N/A;    Past Medical Hx: Has never had a mammogram. She's never had a colonoscopy. Past Medical History  Diagnosis Date  . SVD (spontaneous vaginal delivery)     x 1  . UTI (lower urinary tract infection) 11/22/11    started abx on 11/24/11  . Diabetes mellitus without complication   . Hypertension   . Anxiety     no meds    . GERD (gastroesophageal reflux disease)     tx zantac  . H/O hiatal hernia   . Arthritis     knees  . Anemia     hx     Family Hx:  Family History  Problem Relation Age of Onset  . Skin cancer Sister   . Cancer Paternal Aunt     Vitals:  Blood pressure 134/62, pulse 80, temperature 98.4 F (36.9 C), resp. rate 16, height 5' 2.68" (1.592 m), weight 189 lb (85.73 kg).  Physical Exam: Well-developed female in no acute distress. BMI is 34. Neck: Supple, no lymphadenopathy, no thyromegaly.  Lungs: Clear to auscultation bilaterally.  Cardiovascular: Regular rate and rhythm.  Abdomen: Morbidly obese. His well-healed right upper quadrant incision. Abdomen is soft, nondistended tender, nondistended. There is no palpable masses or hepatosplenomegaly. Exam is limited by habitus.  Groins: No lymphadenopathy.  Extremities: 1+ nonpitting edema equal bilaterally on the medial aspect of her ankles bilaterally. Next  Pelvic: Normal external fetal genitalia. The cervix is somewhat atrophic. The cervix is visualized. This is tenaculum site on the anterior lip is healing well. There is no gross visible lesions. Bimanual examination reveals that uterus to be of normal size shape and consistency. There is no adnexal masses. Exam is somewhat limited by habitus.  Assessment/Plan: 66-year-old with a clinical stage I grade 1 endometrioid adenocarcinoma diagnosed at the time of D&C.  I had a lengthy discussion with the patient and her husband regarding her cancer. I discussed with them the definitive treatment included a painter oophorectomy. He also noted that the uterus we sent for frozen section. If at the time of frozen section there is deeper myometrial invasion (greater than 50%) were greater greatly to proceed with lymphadenectomy. If there's less than 50% myometrial invasion on frozen section the tumor appears to be a grade 1 the procedure will be concluded. I do think it is worthwhile trying to  proceed with her surgery to minimally invasive fashion. I discussed with them that based on her habitus as well as her prior surgery there is probably a 5% risk of conversion to difficulty with visualization.  Risks of surgery including but not limited to bleeding, infection, injury to surrounding organs, and thromboembolic disease were discussed with the patient. Their questions were elicited in answer to their satisfaction. They wish to proceed with surgery.  We'll tentatively scheduled her surgery for next Tuesday, November 26. She understands if it is able to be done in a minimally invasive fashion and if  she meets all postoperative goals, she will be able to go home on  postoperative day #1and she will not require Lovenox. If we need to proceed with a laparotomy she social be in   the hospital 2-3 days she'll be discharged home on Lovenox for 4 weeks. She'll be seen in the pre-care suite. They'll call us if they have questions prior to her surgery.    Tramain Gershman A., MD 12/08/2011, 11:20 AM   

## 2011-12-10 ENCOUNTER — Encounter (HOSPITAL_COMMUNITY): Payer: Self-pay | Admitting: Pharmacy Technician

## 2011-12-10 ENCOUNTER — Encounter (HOSPITAL_COMMUNITY): Payer: Self-pay

## 2011-12-10 ENCOUNTER — Encounter (HOSPITAL_COMMUNITY)
Admission: RE | Admit: 2011-12-10 | Discharge: 2011-12-10 | Disposition: A | Discharge status: Home / Self Care | Payer: Managed Care, Other (non HMO) | Source: Ambulatory Visit | Attending: Gynecologic Oncology | Admitting: Gynecologic Oncology

## 2011-12-10 HISTORY — DX: Malignant (primary) neoplasm, unspecified: C80.1

## 2011-12-10 LAB — HEPATIC FUNCTION PANEL
ALT: 21 U/L (ref 0–35)
AST: 18 U/L (ref 0–37)
Albumin: 3.7 g/dL (ref 3.5–5.2)
Alkaline Phosphatase: 89 U/L (ref 39–117)
Indirect Bilirubin: 0.4 mg/dL (ref 0.3–0.9)
Total Protein: 8.2 g/dL (ref 6.0–8.3)

## 2011-12-10 LAB — BASIC METABOLIC PANEL
CO2: 26 mEq/L (ref 19–32)
Calcium: 9.8 mg/dL (ref 8.4–10.5)
Creatinine, Ser: 0.73 mg/dL (ref 0.50–1.10)
GFR calc Af Amer: >90 mL/min (ref >90)

## 2011-12-10 LAB — DIFFERENTIAL
Eosinophils Absolute: 0.7 10*3/uL (ref 0.0–0.7)
Eosinophils Relative: 7 % — ABNORMAL HIGH (ref 0–5)
Lymphs Abs: 2.5 10*3/uL (ref 0.7–4.0)
Monocytes Absolute: 0.5 10*3/uL (ref 0.1–1.0)
Monocytes Relative: 5 % (ref 3–12)

## 2011-12-10 LAB — URINALYSIS, ROUTINE W REFLEX MICROSCOPIC
Glucose, UA: NEGATIVE mg/dL
Ketones, ur: NEGATIVE mg/dL
Leukocytes, UA: NEGATIVE
Protein, ur: NEGATIVE mg/dL

## 2011-12-10 LAB — CBC
MCH: 27.1 pg (ref 26.0–34.0)
MCV: 80.4 fL (ref 78.0–100.0)
Platelets: 273 10*3/uL (ref 150–400)
RDW: 13.4 % (ref 11.5–15.5)

## 2011-12-10 NOTE — Pre-Procedure Instructions (Signed)
Pt's husband Baldo Ash -notified that his wife is to be on clear liquid diet day before surgery - all day Monday  12/13/11.  List of clear liquids reviewed with him.  Clear liquids diet ordered by Dr. Duard Brady.  He voiced understanding.

## 2011-12-10 NOTE — Pre-Procedure Instructions (Signed)
PREOP ORDERS NOW IN EPIC FROM DR. GEHRIG--LAB NOTIFIED TO ADD ON DIFF AND LIVER PROFILE AS DR. Duard Brady ORDERED CBC WITH DIFF AND CMET.

## 2011-12-10 NOTE — Patient Instructions (Signed)
YOUR SURGERY IS SCHEDULED AT [facility]  ON:   Tuesday  11/26  AT 10:30 AM  REPORT TO [facility] AT:  8:00 AM      PHONE # FOR SHORT STAY IS [phone]  DO NOT EAT OR DRINK ANYTHING AFTER MIDNIGHT THE NIGHT BEFORE YOUR SURGERY.  YOU MAY BRUSH YOUR TEETH, RINSE OUT YOUR MOUTH--BUT NO WATER, NO FOOD, NO CHEWING GUM, NO MINTS, NO CANDIES, NO CHEWING TOBACCO.  PLEASE TAKE THE FOLLOWING MEDICATIONS THE AM OF YOUR SURGERY WITH A FEW SIPS OF WATER:  RANITIDINE   IF YOU USE INHALERS--USE YOUR INHALERS THE AM OF YOUR SURGERY AND BRING INHALERS TO THE HOSPITAL -TAKE TO SURGERY.    IF YOU ARE DIABETIC:  DO NOT TAKE ANY DIABETIC MEDICATIONS THE AM OF YOUR SURGERY.  IF YOU TAKE INSULIN IN THE EVENINGS--PLEASE ONLY TAKE 1/2 NORMAL EVENING DOSE THE NIGHT BEFORE YOUR SURGERY.  NO INSULIN THE AM OF YOUR SURGERY.  IF YOU HAVE SLEEP APNEA AND USE CPAP OR BIPAP--PLEASE BRING THE MASK AND THE TUBING.  DO NOT BRING YOUR MACHINE.  AFTER SURGERY-REMEMBER TO DO LEG EXERCISES AND TURN FREQUENTLY WHEN IN BED--DO DEEP BREATHING EXERCISES EVERY COUPLE OF HOURS TO PREVENT PNEUMONIA OR OTHER LUNG COMPLICATIONS.  DO NOT BRING VALUABLES, MONEY, CREDIT CARDS.  DO NOT WEAR JEWELRY, MAKE-UP, NAIL POLISH AND NO METAL PINS OR CLIPS IN YOUR HAIR. CONTACT LENS, DENTURES / PARTIALS, GLASSES SHOULD NOT BE WORN TO SURGERY AND IN MOST CASES-HEARING AIDS WILL NEED TO BE REMOVED.  BRING YOUR GLASSES CASE, ANY EQUIPMENT NEEDED FOR YOUR CONTACT LENS. FOR PATIENTS ADMITTED TO THE HOSPITAL--CHECK OUT TIME THE DAY OF DISCHARGE IS 11:00 AM.  ALL INPATIENT ROOMS ARE PRIVATE - WITH BATHROOM, TELEPHONE, TELEVISION AND WIFI INTERNET.  IF YOU ARE BEING DISCHARGED THE SAME DAY OF YOUR SURGERY--YOU CAN NOT DRIVE YOURSELF HOME--AND SHOULD NOT GO HOME ALONE BY TAXI OR BUS.  NO DRIVING OR OPERATING MACHINERY FOR 24 HOURS FOLLOWING ANESTHESIA / PAIN MEDICATIONS.  PLEASE MAKE ARRANGEMENTS FOR SOMEONE TO BE WITH YOU AT HOME  THE FIRST 24 HOURS AFTER SURGERY. RESPONSIBLE DRIVER'S NAME___________________________                                               PHONE #   _______________________                                  PLEASE READ OVER ANY  FACT SHEETS THAT YOU WERE GIVEN: MRSA INFORMATION

## 2011-12-10 NOTE — Pre-Procedure Instructions (Signed)
CBC, BMET, UA, T/S WERE DONE TODAY - PREOP -AT [facility] AS PER ANESTHESIOLOGIST'S GUIDELINES--NO PREOP ORDERS IN EPIC YET FROM DR. GEHRIG.  PT WILL SIGN CONSENT FOR SURGERY DAY OF HER SURGERY.  PT HAS EKG REPORT IN EPIC -DONE 11/25/11.  PT HAS CXR REPORT ON HER CHART FROM DR. W. HARRIS--IT WAS DONE 10/19/11.

## 2011-12-14 ENCOUNTER — Encounter (HOSPITAL_COMMUNITY)
Admission: RE | Disposition: A | Discharge status: Home / Self Care | Payer: Self-pay | Source: Ambulatory Visit | Attending: Obstetrics & Gynecology

## 2011-12-14 ENCOUNTER — Ambulatory Visit (HOSPITAL_COMMUNITY): Payer: Managed Care, Other (non HMO) | Admitting: Anesthesiology

## 2011-12-14 ENCOUNTER — Ambulatory Visit (HOSPITAL_COMMUNITY)
Admission: RE | Admit: 2011-12-14 | Discharge: 2011-12-15 | Disposition: A | Discharge status: Home / Self Care | Payer: Managed Care, Other (non HMO) | Source: Ambulatory Visit | Attending: Obstetrics & Gynecology | Admitting: Obstetrics & Gynecology

## 2011-12-14 ENCOUNTER — Encounter (HOSPITAL_COMMUNITY): Payer: Self-pay | Admitting: *Deleted

## 2011-12-14 ENCOUNTER — Encounter (HOSPITAL_COMMUNITY): Payer: Self-pay | Admitting: Anesthesiology

## 2011-12-14 DIAGNOSIS — Z8542 Personal history of malignant neoplasm of other parts of uterus: Secondary | ICD-10-CM | POA: Diagnosis present

## 2011-12-14 DIAGNOSIS — K219 Gastro-esophageal reflux disease without esophagitis: Secondary | ICD-10-CM | POA: Insufficient documentation

## 2011-12-14 DIAGNOSIS — C549 Malignant neoplasm of corpus uteri, unspecified: Secondary | ICD-10-CM | POA: Insufficient documentation

## 2011-12-14 DIAGNOSIS — N8000 Endometriosis of the uterus, unspecified: Secondary | ICD-10-CM | POA: Insufficient documentation

## 2011-12-14 DIAGNOSIS — K449 Diaphragmatic hernia without obstruction or gangrene: Secondary | ICD-10-CM | POA: Insufficient documentation

## 2011-12-14 DIAGNOSIS — C541 Malignant neoplasm of endometrium: Secondary | ICD-10-CM

## 2011-12-14 DIAGNOSIS — I10 Essential (primary) hypertension: Secondary | ICD-10-CM | POA: Insufficient documentation

## 2011-12-14 DIAGNOSIS — Z79899 Other long term (current) drug therapy: Secondary | ICD-10-CM | POA: Insufficient documentation

## 2011-12-14 DIAGNOSIS — Z87440 Personal history of urinary (tract) infections: Secondary | ICD-10-CM | POA: Insufficient documentation

## 2011-12-14 DIAGNOSIS — E119 Type 2 diabetes mellitus without complications: Secondary | ICD-10-CM | POA: Insufficient documentation

## 2011-12-14 DIAGNOSIS — D259 Leiomyoma of uterus, unspecified: Secondary | ICD-10-CM | POA: Insufficient documentation

## 2011-12-14 HISTORY — PX: ROBOTIC ASSISTED TOTAL HYSTERECTOMY WITH BILATERAL SALPINGO OOPHERECTOMY: SHX6086

## 2011-12-14 HISTORY — PX: LYMPH NODE DISSECTION: SHX5087

## 2011-12-14 LAB — GLUCOSE, CAPILLARY
Glucose-Capillary: 205 mg/dL — ABNORMAL HIGH (ref 70–99)
Glucose-Capillary: 190 mg/dL — ABNORMAL HIGH (ref 70–99)

## 2011-12-14 SURGERY — ROBOTIC ASSISTED TOTAL HYSTERECTOMY WITH BILATERAL SALPINGO OOPHORECTOMY
Anesthesia: General | Site: Abdomen | Wound class: Clean Contaminated

## 2011-12-14 MED ORDER — TRIAMTERENE-HCTZ 37.5-25 MG PO TABS
1.0000 | ORAL_TABLET | Freq: Every day | ORAL | Status: DC
  Administered 2011-12-14: 1 via ORAL
  Filled 2011-12-14 (×2): qty 1

## 2011-12-14 MED ORDER — ONDANSETRON HCL 4 MG/2ML IJ SOLN
INTRAMUSCULAR | Status: DC | PRN
  Administered 2011-12-14: 4 mg via INTRAVENOUS

## 2011-12-14 MED ORDER — GLYCOPYRROLATE 0.2 MG/ML IJ SOLN
INTRAMUSCULAR | Status: DC | PRN
  Administered 2011-12-14: .6 mg via INTRAVENOUS

## 2011-12-14 MED ORDER — KETOROLAC TROMETHAMINE 30 MG/ML IJ SOLN
15.0000 mg | Freq: Four times a day (QID) | INTRAMUSCULAR | Status: AC
  Administered 2011-12-14 – 2011-12-15 (×2): 15 mg via INTRAVENOUS

## 2011-12-14 MED ORDER — LACTATED RINGERS IV SOLN
INTRAVENOUS | Status: DC | PRN
  Administered 2011-12-14: 500 mL via INTRAVENOUS

## 2011-12-14 MED ORDER — ONDANSETRON HCL 4 MG/2ML IJ SOLN
4.0000 mg | Freq: Four times a day (QID) | INTRAMUSCULAR | Status: DC | PRN
  Administered 2011-12-14: 4 mg via INTRAVENOUS
  Filled 2011-12-14: qty 2

## 2011-12-14 MED ORDER — LACTATED RINGERS IV SOLN
INTRAVENOUS | Status: DC
  Administered 2011-12-14: 14:00:00 via INTRAVENOUS
  Administered 2011-12-14: 1000 mL via INTRAVENOUS
  Administered 2011-12-14: 11:00:00 via INTRAVENOUS

## 2011-12-14 MED ORDER — HYDROMORPHONE HCL PF 1 MG/ML IJ SOLN
INTRAMUSCULAR | Status: AC
  Filled 2011-12-14: qty 1

## 2011-12-14 MED ORDER — INSULIN ASPART 100 UNIT/ML ~~LOC~~ SOLN
3.0000 [IU] | Freq: Once | SUBCUTANEOUS | Status: AC
  Administered 2011-12-14: 3 [IU] via SUBCUTANEOUS

## 2011-12-14 MED ORDER — SUCCINYLCHOLINE CHLORIDE 20 MG/ML IJ SOLN
INTRAMUSCULAR | Status: DC | PRN
  Administered 2011-12-14: 100 mg via INTRAVENOUS

## 2011-12-14 MED ORDER — KCL IN DEXTROSE-NACL 20-5-0.45 MEQ/L-%-% IV SOLN
INTRAVENOUS | Status: DC
  Administered 2011-12-14 – 2011-12-15 (×2): via INTRAVENOUS
  Filled 2011-12-14 (×4): qty 1000

## 2011-12-14 MED ORDER — MIDAZOLAM HCL 5 MG/5ML IJ SOLN
INTRAMUSCULAR | Status: DC | PRN
  Administered 2011-12-14: 2 mg via INTRAVENOUS

## 2011-12-14 MED ORDER — OXYCODONE HCL 5 MG/5ML PO SOLN
5.0000 mg | Freq: Once | ORAL | Status: DC | PRN
  Filled 2011-12-14: qty 5

## 2011-12-14 MED ORDER — OXYCODONE-ACETAMINOPHEN 5-325 MG PO TABS
1.0000 | ORAL_TABLET | ORAL | Status: DC | PRN
  Administered 2011-12-14 – 2011-12-15 (×2): 2 via ORAL
  Filled 2011-12-14 (×2): qty 2

## 2011-12-14 MED ORDER — FAMOTIDINE 20 MG PO TABS
20.0000 mg | ORAL_TABLET | Freq: Two times a day (BID) | ORAL | Status: DC
  Administered 2011-12-14: 20 mg via ORAL
  Filled 2011-12-14 (×4): qty 1

## 2011-12-14 MED ORDER — CIPROFLOXACIN IN D5W 400 MG/200ML IV SOLN
400.0000 mg | INTRAVENOUS | Status: AC
  Administered 2011-12-14: 400 mg via INTRAVENOUS

## 2011-12-14 MED ORDER — METRONIDAZOLE IN NACL 5-0.79 MG/ML-% IV SOLN
500.0000 mg | INTRAVENOUS | Status: AC
  Administered 2011-12-14: 500 mg via INTRAVENOUS

## 2011-12-14 MED ORDER — PROPOFOL 10 MG/ML IV BOLUS
INTRAVENOUS | Status: DC | PRN
  Administered 2011-12-14: 120 mg via INTRAVENOUS

## 2011-12-14 MED ORDER — METRONIDAZOLE IN NACL 5-0.79 MG/ML-% IV SOLN
INTRAVENOUS | Status: AC
  Filled 2011-12-14: qty 100

## 2011-12-14 MED ORDER — HYDROMORPHONE HCL PF 1 MG/ML IJ SOLN
0.2500 mg | INTRAMUSCULAR | Status: DC | PRN
  Administered 2011-12-14 (×2): 0.5 mg via INTRAVENOUS

## 2011-12-14 MED ORDER — CIPROFLOXACIN IN D5W 400 MG/200ML IV SOLN
INTRAVENOUS | Status: AC
  Filled 2011-12-14: qty 200

## 2011-12-14 MED ORDER — DOXAZOSIN MESYLATE 2 MG PO TABS
2.0000 mg | ORAL_TABLET | Freq: Every day | ORAL | Status: DC
  Administered 2011-12-14: 2 mg via ORAL
  Filled 2011-12-14 (×2): qty 1

## 2011-12-14 MED ORDER — ZOLPIDEM TARTRATE 5 MG PO TABS: 5.0000 mg | ORAL_TABLET | Freq: Every evening | ORAL | Status: DC | PRN

## 2011-12-14 MED ORDER — ACETAMINOPHEN 10 MG/ML IV SOLN
INTRAVENOUS | Status: AC
  Filled 2011-12-14: qty 100

## 2011-12-14 MED ORDER — KETOROLAC TROMETHAMINE 30 MG/ML IJ SOLN
15.0000 mg | Freq: Four times a day (QID) | INTRAMUSCULAR | Status: AC
  Filled 2011-12-14 (×2): qty 1

## 2011-12-14 MED ORDER — INSULIN ASPART 100 UNIT/ML ~~LOC~~ SOLN
0.0000 [IU] | SUBCUTANEOUS | Status: DC
  Administered 2011-12-14: 5 [IU] via SUBCUTANEOUS
  Administered 2011-12-14 – 2011-12-15 (×3): 3 [IU] via SUBCUTANEOUS
  Administered 2011-12-15: 5 [IU] via SUBCUTANEOUS

## 2011-12-14 MED ORDER — OXYCODONE HCL 5 MG PO TABS: 5.0000 mg | ORAL_TABLET | Freq: Once | ORAL | Status: DC | PRN

## 2011-12-14 MED ORDER — INSULIN GLARGINE 100 UNIT/ML ~~LOC~~ SOLN
10.0000 [IU] | Freq: Every day | SUBCUTANEOUS | Status: DC
  Administered 2011-12-14: 10 [IU] via SUBCUTANEOUS

## 2011-12-14 MED ORDER — ACETAMINOPHEN 10 MG/ML IV SOLN: 1000.0000 mg | Freq: Once | INTRAVENOUS | Status: DC | PRN

## 2011-12-14 MED ORDER — MORPHINE SULFATE 2 MG/ML IJ SOLN: 2.0000 mg | INTRAMUSCULAR | Status: DC | PRN

## 2011-12-14 MED ORDER — ROCURONIUM BROMIDE 100 MG/10ML IV SOLN
INTRAVENOUS | Status: DC | PRN
  Administered 2011-12-14: 40 mg via INTRAVENOUS
  Administered 2011-12-14: 10 mg via INTRAVENOUS

## 2011-12-14 MED ORDER — FENTANYL CITRATE 0.05 MG/ML IJ SOLN
INTRAMUSCULAR | Status: DC | PRN
  Administered 2011-12-14 (×6): 50 ug via INTRAVENOUS
  Administered 2011-12-14: 100 ug via INTRAVENOUS
  Administered 2011-12-14: 50 ug via INTRAVENOUS

## 2011-12-14 MED ORDER — INSULIN ASPART 100 UNIT/ML ~~LOC~~ SOLN
SUBCUTANEOUS | Status: AC
  Filled 2011-12-14: qty 1

## 2011-12-14 MED ORDER — NEOSTIGMINE METHYLSULFATE 1 MG/ML IJ SOLN
INTRAMUSCULAR | Status: DC | PRN
  Administered 2011-12-14: 4 mg via INTRAVENOUS

## 2011-12-14 MED ORDER — MEPERIDINE HCL 50 MG/ML IJ SOLN
6.2500 mg | INTRAMUSCULAR | Status: DC | PRN
  Administered 2011-12-14: 12.5 mg via INTRAVENOUS

## 2011-12-14 MED ORDER — ONDANSETRON HCL 4 MG PO TABS: 4.0000 mg | ORAL_TABLET | Freq: Four times a day (QID) | ORAL | Status: DC | PRN

## 2011-12-14 MED ORDER — PROMETHAZINE HCL 25 MG/ML IJ SOLN: 6.2500 mg | INTRAMUSCULAR | Status: DC | PRN

## 2011-12-14 MED ORDER — MEPERIDINE HCL 50 MG/ML IJ SOLN
INTRAMUSCULAR | Status: AC
  Filled 2011-12-14: qty 1

## 2011-12-14 SURGICAL SUPPLY — 51 items
APL SKNCLS STERI-STRIP NONHPOA (GAUZE/BANDAGES/DRESSINGS) ×1
BAG SPEC RTRVL LRG 6X4 10 (ENDOMECHANICALS) ×2
BENZOIN TINCTURE PRP APPL 2/3 (GAUZE/BANDAGES/DRESSINGS) ×2 IMPLANT
CHLORAPREP W/TINT 26ML (MISCELLANEOUS) ×2 IMPLANT
CLOTH BEACON ORANGE TIMEOUT ST (SAFETY) ×2 IMPLANT
CORD HIGH FREQUENCY UNIPOLAR (ELECTROSURGICAL) ×1 IMPLANT
CORDS BIPOLAR (ELECTRODE) ×2 IMPLANT
COVER MAYO STAND STRL (DRAPES) ×2 IMPLANT
COVER SURGICAL LIGHT HANDLE (MISCELLANEOUS) ×2 IMPLANT
COVER TIP SHEARS 8 DVNC (MISCELLANEOUS) ×1 IMPLANT
COVER TIP SHEARS 8MM DA VINCI (MISCELLANEOUS) ×1
DECANTER SPIKE VIAL GLASS SM (MISCELLANEOUS) ×1 IMPLANT
DRAPE LG THREE QUARTER DISP (DRAPES) ×4 IMPLANT
DRAPE SURG IRRIG POUCH 19X23 (DRAPES) ×2 IMPLANT
DRAPE TABLE BACK 44X90 PK DISP (DRAPES) ×2 IMPLANT
DRAPE UTILITY XL STRL (DRAPES) ×2 IMPLANT
DRAPE WARM FLUID 44X44 (DRAPE) ×2 IMPLANT
DRSG TEGADERM 2-3/8X2-3/4 SM (GAUZE/BANDAGES/DRESSINGS) ×1 IMPLANT
DRSG TEGADERM 4X4.75 (GAUZE/BANDAGES/DRESSINGS) ×1 IMPLANT
DRSG TEGADERM 6X8 (GAUZE/BANDAGES/DRESSINGS) ×4 IMPLANT
ELECT REM PT RETURN 9FT ADLT (ELECTROSURGICAL) ×2
ELECTRODE REM PT RTRN 9FT ADLT (ELECTROSURGICAL) ×1 IMPLANT
GAUZE VASELINE 3X9 (GAUZE/BANDAGES/DRESSINGS) IMPLANT
GLOVE BIO SURGEON STRL SZ 6.5 (GLOVE) ×9 IMPLANT
GLOVE BIO SURGEON STRL SZ7.5 (GLOVE) ×3 IMPLANT
GLOVE BIOGEL PI IND STRL 7.0 (GLOVE) ×2 IMPLANT
GLOVE BIOGEL PI INDICATOR 7.0 (GLOVE) ×2
GOWN PREVENTION PLUS XLARGE (GOWN DISPOSABLE) ×10 IMPLANT
HOLDER FOLEY CATH W/STRAP (MISCELLANEOUS) ×2 IMPLANT
KIT ACCESSORY DA VINCI DISP (KITS) ×1
KIT ACCESSORY DVNC DISP (KITS) ×1 IMPLANT
MANIPULATOR UTERINE 4.5 ZUMI (MISCELLANEOUS) ×2 IMPLANT
OCCLUDER COLPOPNEUMO (BALLOONS) ×2 IMPLANT
PACK LAPAROSCOPY W LONG (CUSTOM PROCEDURE TRAY) ×2 IMPLANT
POUCH SPECIMEN RETRIEVAL 10MM (ENDOMECHANICALS) ×4 IMPLANT
SET TUBE IRRIG SUCTION NO TIP (IRRIGATION / IRRIGATOR) ×2 IMPLANT
SHEET LAVH (DRAPES) ×2 IMPLANT
SOLUTION ELECTROLUBE (MISCELLANEOUS) ×2 IMPLANT
SPONGE LAP 18X18 X RAY DECT (DISPOSABLE) IMPLANT
STRIP CLOSURE SKIN 1/2X4 (GAUZE/BANDAGES/DRESSINGS) ×2 IMPLANT
SUT VIC AB 0 CT1 27 (SUTURE) ×2
SUT VIC AB 0 CT1 27XBRD ANTBC (SUTURE) ×3 IMPLANT
SUT VIC AB 4-0 PS2 27 (SUTURE) ×4 IMPLANT
SUT VICRYL 0 UR6 27IN ABS (SUTURE) ×2 IMPLANT
SYR 50ML LL SCALE MARK (SYRINGE) ×2 IMPLANT
SYR BULB IRRIGATION 50ML (SYRINGE) IMPLANT
TRAP SPECIMEN MUCOUS 40CC (MISCELLANEOUS) ×1 IMPLANT
TRAY FOLEY CATH 14FRSI W/METER (CATHETERS) ×2 IMPLANT
TROCAR XCEL 12X100 BLDLESS (ENDOMECHANICALS) ×2 IMPLANT
TROCAR XCEL BLADELESS 5X75MML (TROCAR) ×2 IMPLANT
WATER STERILE IRR 1500ML POUR (IV SOLUTION) ×2 IMPLANT

## 2011-12-14 NOTE — Transfer of Care (Signed)
Immediate Anesthesia Transfer of Care Note  Patient: Meagan [name]  Procedure(s) Performed: Procedure(s) (LRB) with comments: ROBOTIC ASSISTED TOTAL HYSTERECTOMY WITH BILATERAL SALPINGO OOPHORECTOMY (N/A) LYMPH NODE DISSECTION (N/A)  Patient Location: PACU  Anesthesia Type:General  Level of Consciousness: awake, alert , oriented and patient cooperative  Airway & Oxygen Therapy: Patient Spontanous Breathing and Patient connected to nasal cannula oxygen  Post-op Assessment: Report given to PACU RN and Post -op Vital signs reviewed and stable  Post vital signs: Reviewed and stable  Complications: No apparent anesthesia complications

## 2011-12-14 NOTE — H&P (View-Only) (Signed)
Consult Note: Gyn-Onc  Meagan [name] 66 y.o. female  CC:  Chief Complaint  Patient presents with  . Endo ca    New patient    HPI: Patient is seen today in consultation at the request of Dr. Tresa Res for newly diagnosed endometrial cancer.    Should is a 66 year old gravida 1 para 1 has been having some vaginal bleeding sincerely part of 2013. She states the bleeding became more pronounced and was bright red starting in June of 2013. It was scant it was a sister with a slight amount of cramping. She has a history of her last menstrual period being in her 75s. She states her cycles just stopped she never had any vasomotor symptoms. She never took any hormone replacement therapy. She had a prior difficult experience with a gynecologist therefore she wished to proceed with a D&C rather than undergoing an endometrial biopsy in the office.   She subsequently underwent a hysteroscopy D&C on November 12 with Dr. Tresa Res. Operative findings included a large submucosal fibroid. The uterus sounded to 6 cm. Final pathology was consistent with a grade 1 endometrioid adenocarcinoma. It is for this reason that she came in to see [place] today.  She states that since her procedure she's really only had a bit of pink discharge and less bleeding than she had before. She denies any pain.  Review of Systems: She has a chest pain, shortness of breath, nausea, vomiting, fevers, chills. She has to use to flight of stairs or house and cons without any shortness of breath. She has been on metformin for the past 3 months. She states she checks her sugars are in the 200s but of course vary depending on what she has eaten. She denies any unintentional weight loss weight gain any headaches or visual changes. 10 point review of systems is otherwise negative.  Current Meds:  Outpatient Encounter Prescriptions as of 12/08/2011  Medication Sig Dispense Refill  . doxazosin (CARDURA) 2 MG tablet Take 2 mg by mouth at bedtime.       . metFORMIN (GLUCOPHAGE) 500 MG tablet Take 500 mg by mouth daily with breakfast.      . ranitidine (ZANTAC) 300 MG tablet Take 300 mg by mouth 2 (two) times daily.      Marland Kitchen triamterene-hydrochlorothiazide (MAXZIDE-25) 37.5-25 MG per tablet Take 1 tablet by mouth daily.        Allergy:  Allergies  Allergen Reactions  . Penicillins Swelling and Other (See Comments)    hallucinations  . Shrimp (Shellfish Allergy) Nausea And Vomiting  . Sinutab Sinus Max St (Phenylephrine-Acetaminophen) Swelling  . Valacyclovir Other (See Comments)    Confusion    Social Hx:  Married. 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 She has a son who is 92 and a grandson who is 4. History   Social History  . Marital Status: Married    Spouse Name: N/A    Number of Children: N/A  . Years of Education: N/A   Occupational History  . Not on file.   Social History Main Topics  . Smoking status: Never Smoker   . Smokeless tobacco: Never Used  . Alcohol Use: No  . Drug Use: No  . Sexually Active: Yes    Birth Control/ Protection: None     Comment: husband had a vasectomy   Other Topics Concern  . Not on file   Social History Narrative  . No narrative on file    Past Surgical  Hx: Cholecystectomy was via laparotomy. Past Surgical History  Procedure Date  . Dilation and curettage of uterus   . Wisdom tooth extraction   . Cholecystectomy   . Dilatation & currettage/hysteroscopy with resectocope 11/30/2011    Procedure: DILATATION & CURETTAGE/HYSTEROSCOPY WITH RESECTOCOPE;  Surgeon: Alison Murray, MD;  Location: [facility];  Service: Gynecology;  Laterality: N/A;    Past Medical Hx: Has never had a mammogram. She's never had a colonoscopy. Past Medical History  Diagnosis Date  . SVD (spontaneous vaginal delivery)     x 1  . UTI (lower urinary tract infection) 11/22/11    started abx on 11/24/11  . Diabetes mellitus without complication   . Hypertension   . Anxiety     no meds    . GERD (gastroesophageal reflux disease)     tx zantac  . H/O hiatal hernia   . Arthritis     knees  . Anemia     hx     Family Hx:  Family History  Problem Relation Age of Onset  . Skin cancer Sister   . Cancer Paternal Aunt     Vitals:  Blood pressure 134/62, pulse 80, temperature 98.4 F (36.9 C), resp. rate 16, height 5' 2.68" (1.592 m), weight 189 lb (85.73 kg).  Physical Exam: Well-developed female in no acute distress. BMI is 34. Neck: Supple, no lymphadenopathy, no thyromegaly.  Lungs: Clear to auscultation bilaterally.  Cardiovascular: Regular rate and rhythm.  Abdomen: Morbidly obese. His well-healed right upper quadrant incision. Abdomen is soft, nondistended tender, nondistended. There is no palpable masses or hepatosplenomegaly. Exam is limited by habitus.  Groins: No lymphadenopathy.  Extremities: 1+ nonpitting edema equal bilaterally on the medial aspect of her ankles bilaterally. Next  Pelvic: Normal external fetal genitalia. The cervix is somewhat atrophic. The cervix is visualized. This is tenaculum site on the anterior lip is healing well. There is no gross visible lesions. Bimanual examination reveals that uterus to be of normal size shape and consistency. There is no adnexal masses. Exam is somewhat limited by habitus.  Assessment/Plan: 65 year old with a clinical stage I grade 1 endometrioid adenocarcinoma diagnosed at the time of D&C.  I had a lengthy discussion with the patient and her husband regarding her cancer. I discussed with them the definitive treatment included a painter oophorectomy. He also noted that the uterus we sent for frozen section. If at the time of frozen section there is deeper myometrial invasion (greater than 50%) were greater greatly to proceed with lymphadenectomy. If there's less than 50% myometrial invasion on frozen section the tumor appears to be a grade 1 the procedure will be concluded. I do think it is worthwhile trying to  proceed with her surgery to minimally invasive fashion. I discussed with them that based on her habitus as well as her prior surgery there is probably a 5% risk of conversion to difficulty with visualization.  Risks of surgery including but not limited to bleeding, infection, injury to surrounding organs, and thromboembolic disease were discussed with the patient. Their questions were elicited in answer to their satisfaction. They wish to proceed with surgery.  We'll tentatively scheduled her surgery for next Tuesday, November 26. She understands if it is able to be done in a minimally invasive fashion and if  she meets all postoperative goals, she will be able to go home on  postoperative day #1and she will not require Lovenox. If we need to proceed with a laparotomy she social be in  the hospital 2-3 days she'll be discharged home on Lovenox for 4 weeks. She'll be seen in the pre-care suite. They'll call us if they have questions prior to her surgery.    Winter Trefz A., MD 12/08/2011, 11:20 AM

## 2011-12-14 NOTE — Preoperative (Signed)
Beta Blockers   Reason not to administer Beta Blockers:Not Applicable 

## 2011-12-14 NOTE — Op Note (Signed)
PATIENT: Meagan [name] DATE OF BIRTH: 12/09/1945 ENCOUNTER DATE: 12/14/11   Preop Diagnosis: grade 1 endometrioid adenocarcinoma.   Postoperative Diagnosis: same.   Surgery: Total robotic hysterectomy bilateral salpingo-oophorectomy, bilateral pelvic lymph node dissection.   Surgeons:  Bernita Buffy. Duard Brady, MD; Antionette Char, MD   Assistant: Telford Nab   Anesthesia: General   Estimated blood loss: 25 ml   IVF: 1500 ml   Urine output: 300 ml   Complications: None   Pathology: Uterus, cervix, bilateral tubes and ovaries, Bilateral pelvic lymph nodes to pathology.   Operative findings: Globular uterus with a 6cm anterior myoma and a 3 cm small pedunculated myoma.  Adhesive disease of the rectosigmoid colon to the left adnexa and pelvic side wall. Frozen section revealed no evidence of residual grade 1 endometrial adenocarcinoma and if there is any residual there is minimal to no myometrial invasion.   Procedure: The patient was identified in the preoperative holding area. Informed consent was signed on the chart. Patient was seen history was reviewed and exam was performed.   The patient was then taken to the operating room and placed in the supine position with SCD hose on.  Her arms were tucked at her side with appropriate precautions on the gel pad. General anesthesia was then induced without difficulty. She was then placed in the dorsolithotomy position.. Shoulder blocks were then placed in the usual fashion with appropriate precautions. A OG-tube was placed to suction. First timeout was performed to confirm the patient procedure antibiotic allergy status, estimated estimated blood loss and OR time. The perineum was then prepped in the usual fashion with chlorhexadine. A 14 French Foley was inserted into the bladder under sterile conditions. A sterile speculum was placed in the vagina. The cervix was without lesions. The cervix was grasped with a single-tooth tenaculum. The  dilator without difficulty. A ZUMI with a medium Koe ring was placed without difficulty. The abdomen was then prepped with 1 Chlor prep sponge per protocol.   Patient was then draped after the prep was dried. Second timeout was performed to confirm the above. After again confirming OG tube placement and it was to suction. A stab-wound was made in left upper quadrant 2 cm below the costal margin on the left in the midclavicular line. A 5 mm operative report was used to assure intra-abdominal placement. The abdomen was insufflated. At this point all points during the procedure the patient's intra-abdominal pressure was not increased over 15 mm of mercury. After insufflation was complete, the patient was placed in deep Trendelenburg position. 25 cm above the pubic symphysis that area was marked the camera port. Bilateral robotic ports were marked 10 cm from the midline incision at approximately 5 angle and the 4th arm port was placed 2 cm above and medial to the ACIS on the left side.  Under direct visualization each of the trochars was placed into the abdomen. The small bowel was folded on its mesentery to allow visualization to the pelvis. The 5 mm LUQ port was then converted to a 10/12 port under direct visualization.  After assuring adequate visualization, the robot was then docked in the usual fashion. Under direct visualization the robotic instruments replaced.   The round ligament on the patient's left side was transected with monopolar cautery. The anterior and posterior leaves of the broad ligament were then taken down in the usual fashion. The ureter was identified on the medial leaf of the broad ligament. A window was made between the IP and  the ureter. The IP was coagulated with bipolar cautery and transected. The posterior leaf of the broad ligament was taken down to the level of the KOH ring. The bladder flap was created using meticulous dissection and pinpoint cautery. The uterine vessels were  coagulated with bipolar cautery. The uterine vessels were then transected and the C loop was created. The same procedure was performed on the patient's right side.   The pneumo-occulder in the vagina was then insufflated. The colpotomy was then created in the usual fashion. The specimen was then delivered to the vagina and sent for frozen section.  The pneumo-occluder was replaced.  Our attention was then drawn to opening the paravesical space on her left side and the perirectal space was also opened. The obturator nerve was identified. The nodal bundle extending over the external iliac artery down to the external iliac vein was taken down using sharp dissection and monopolar cautery. The genitofemoral nerve was identified and spared. We continued our dissection down to the level of the obturator nerve. The nodal bundle superior to the obturator nerve was taken. All pedicles were noted to be hemostatic and the ureter was noted to be well medial of the area of dissection. The nodal bundle was then placed and an Endo catch bag. The same procedure was performed on the right side. The right nodal bundle was placed in an endocatch bag.  At this point frozen section returned as no obvious residual adenocarcinoma and at worst only minimal myometrial invasion of grade 1 lesion. The decision was made to stop the procedure is no further lymphadenectomy was indicated.  All specimens were delivered to the vagina.  The vaginal cuff was closed with a running 0 Vicryl on CT 1 suture. The abdomen and pelvis were copiously irrigated and noted to be hemostatic. The robotic instruments were removed under direct visualization as were the robotic trochars. The pneumoperitoneum was removed. The patient was then taken out of the Trendelenburg position. The midline camera port fascia was grasped with allis clamps and using a 0 Vicryl on a UR 6 needle the midline port port fascia was closed with a figure of 8 suture  and  in the left  upper quadrant ports the subcutaneous tissues were reapproximated. The skin was closed using 4-0 Vicryl. Steri-Strips and benzoin were applied. The pneumo occluded balloon was removed from the vagina. The vagina was swabbed and noted to be hemostatic.   All instrument needle and Ray-Tec counts were correct x2. The patient tolerated the procedure well and was taken to the recovery room in stable condition. This is Cleda Mccreedy dictating an operative note on patient Meagan [name].

## 2011-12-14 NOTE — Interval H&P Note (Signed)
History and Physical Interval Note:  12/14/2011 9:14 AM  Meagan [name]  has presented today for surgery, with the diagnosis of endometrial cancer  The various methods of treatment have been discussed with the patient and family. After consideration of risks, benefits and other options for treatment, the patient has consented to  Procedure(s) (LRB) with comments: ROBOTIC ASSISTED TOTAL HYSTERECTOMY WITH BILATERAL SALPINGO OOPHORECTOMY (N/A) LYMPH NODE DISSECTION (N/A) as a surgical intervention .  The patient's history has been reviewed, patient examined, no change in status, stable for surgery.  I have reviewed the patient's chart and labs.  Questions were answered to the patient's satisfaction.     Chancy Smigiel A.

## 2011-12-14 NOTE — Anesthesia Preprocedure Evaluation (Addendum)
Anesthesia Evaluation  Patient identified by MRN, date of birth, ID band Patient awake    Reviewed: Allergy & Precautions, H&P , NPO status , Patient's Chart, lab work & pertinent test results  Airway Mallampati: II TM Distance: >3 FB Neck ROM: Full    Dental  (+) Dental Advisory Given and Teeth Intact   Pulmonary neg pulmonary ROS,  breath sounds clear to auscultation  Pulmonary exam normal       Cardiovascular hypertension, Pt. on medications Rhythm:Regular Rate:Normal     Neuro/Psych Anxiety negative neurological ROS  negative psych ROS   GI/Hepatic Neg liver ROS, hiatal hernia, GERD-  Medicated,  Endo/Other  diabetes, Type 2, Oral Hypoglycemic Agents  Renal/GU negative Renal ROS     Musculoskeletal negative musculoskeletal ROS (+)   Abdominal (+) + obese,   Peds  Hematology  (+) Blood dyscrasia, anemia ,   Anesthesia Other Findings   Reproductive/Obstetrics                          Anesthesia Physical Anesthesia Plan  ASA: III  Anesthesia Plan: General   Post-op Pain Management:    Induction: Intravenous  Airway Management Planned: Oral ETT  Additional Equipment:   Intra-op Plan:   Post-operative Plan: Extubation in OR  Informed Consent: I have reviewed the patients History and Physical, chart, labs and discussed the procedure including the risks, benefits and alternatives for the proposed anesthesia with the patient or authorized representative who has indicated his/her understanding and acceptance.   Dental advisory given  Plan Discussed with: CRNA  Anesthesia Plan Comments:         Anesthesia Quick Evaluation

## 2011-12-14 NOTE — Anesthesia Postprocedure Evaluation (Signed)
Anesthesia Post Note  Patient: Meagan [name]  Procedure(s) Performed: Procedure(s) (LRB): ROBOTIC ASSISTED TOTAL HYSTERECTOMY WITH BILATERAL SALPINGO OOPHORECTOMY (N/A) LYMPH NODE DISSECTION (N/A)  Anesthesia type: General  Patient location: PACU  Post pain: Pain level controlled  Post assessment: Post-op Vital signs reviewed  Last Vitals: BP 144/76  Pulse 87  Temp 36.6 C (Oral)  Resp 12  Ht 5\' 2"  (1.575 m)  Wt 186 lb (84.369 kg)  BMI 34.02 kg/m2  SpO2 97%  Post vital signs: Reviewed  Level of consciousness: sedated  Complications: No apparent anesthesia complications

## 2011-12-15 ENCOUNTER — Encounter (HOSPITAL_COMMUNITY): Payer: Self-pay | Admitting: Gynecologic Oncology

## 2011-12-15 LAB — CBC
HCT: 39.1 % (ref 36.0–46.0)
MCHC: 33.8 g/dL (ref 30.0–36.0)
MCV: 80.1 fL (ref 78.0–100.0)
Platelets: 276 10*3/uL (ref 150–400)
RDW: 13.4 % (ref 11.5–15.5)
WBC: 15.9 10*3/uL — ABNORMAL HIGH (ref 4.0–10.5)

## 2011-12-15 LAB — BASIC METABOLIC PANEL
BUN: 12 mg/dL (ref 6–23)
CO2: 24 mEq/L (ref 19–32)
Calcium: 8.4 mg/dL (ref 8.4–10.5)
Chloride: 93 mEq/L — ABNORMAL LOW (ref 96–112)
Creatinine, Ser: 1.02 mg/dL (ref 0.50–1.10)

## 2011-12-15 LAB — GLUCOSE, CAPILLARY: Glucose-Capillary: 212 mg/dL — ABNORMAL HIGH (ref 70–99)

## 2011-12-15 MED ORDER — POTASSIUM CHLORIDE ER 10 MEQ PO TBCR: 10.0000 meq | EXTENDED_RELEASE_TABLET | Freq: Two times a day (BID) | ORAL | Status: DC

## 2011-12-15 MED ORDER — POTASSIUM CHLORIDE CRYS ER 20 MEQ PO TBCR: 10.0000 meq | EXTENDED_RELEASE_TABLET | Freq: Two times a day (BID) | ORAL | Status: DC

## 2011-12-15 MED ORDER — POTASSIUM CHLORIDE CRYS ER 20 MEQ PO TBCR
20.0000 meq | EXTENDED_RELEASE_TABLET | Freq: Once | ORAL | Status: AC
  Administered 2011-12-15: 20 meq via ORAL
  Filled 2011-12-15: qty 1

## 2011-12-15 MED ORDER — OXYCODONE-ACETAMINOPHEN 5-325 MG PO TABS: 1.0000 | ORAL_TABLET | ORAL | Status: DC | PRN

## 2011-12-15 NOTE — Progress Notes (Signed)
Harriett Sine NP for OBGYN on floor and aware patient wanting zantac from home instead of pepcid.

## 2011-12-15 NOTE — Progress Notes (Signed)
Patient reported that her stomach tolerates Zantac only.

## 2011-12-15 NOTE — Progress Notes (Signed)
Patient discharged via wheelchair. Rx for percocet given. Per pt and NT has urinated . States understanding of discharge instructions, offered diabetic educator to see to reeducate on carb counting patient declines.

## 2011-12-15 NOTE — Discharge Summary (Signed)
Physician Discharge Summary  Patient ID: Meagan [name] MRN: [id number] DOB/AGE: 65/09/1945 65 y.o.  Admit date: 12/14/2011 Discharge date: 12/15/2011  Admission Diagnoses: Endometrial adenocarcinoma  Discharge Diagnoses:  Principal Problem:  *Endometrial adenocarcinoma  Discharged Condition:  The patient is in good condition and stable for discharge.  Hospital Course: On 12/14/2011, the patient underwent the following: Procedure(s): ROBOTIC ASSISTED TOTAL HYSTERECTOMY WITH BILATERAL SALPINGO OOPHORECTOMY, LYMPH NODE DISSECTION.  The postoperative course was uneventful.  She was discharged to home on postoperative day 1 tolerating a regular diet.  She was discharged home with Potassium (Kdur) 10 meq BID for three days.  Reportable signs and symptoms reviewed.  Consults: None  Significant Diagnostic Studies: None  Treatments: surgery: See above  Discharge Exam: Blood pressure 133/64, pulse 82, temperature 98 F (36.7 C), temperature source Oral, resp. rate 18, height 5\' 2"  (1.575 m), weight 186 lb (84.369 kg), SpO2 95.00%. General appearance: alert, cooperative and no distress Resp: clear to auscultation bilaterally Cardio: regular rate and rhythm, S1, S2 normal, no murmur, click, rub or gallop GI: soft, non-tender; bowel sounds normal; no masses,  no organomegaly Extremities: extremities normal, atraumatic, no cyanosis or edema Incision/Wound: Lap sites with steri strips clean, dry, and intact  Disposition: 01-Home or Self Care      Discharge Orders    Future Orders Please Complete By Expires   Diet - low sodium heart healthy      Increase activity slowly      Driving Restrictions      Comments:   No driving for 1-2 weeks.  Do not take narcotics and drive.   Lifting restrictions      Comments:   No lifting greater than 10 lbs.   Sexual Activity Restrictions      Comments:   No sexual activity, nothing in the vagina, for 8 weeks.   Call MD for:  temperature >100.4       Call MD for:  persistant nausea and vomiting      Call MD for:  severe uncontrolled pain      Call MD for:  redness, tenderness, or signs of infection (pain, swelling, redness, odor or green/yellow discharge around incision site)      Call MD for:  difficulty breathing, headache or visual disturbances      Call MD for:  hives      Call MD for:  persistant dizziness or light-headedness      Call MD for:  extreme fatigue          Medication List     As of 12/15/2011  9:16 AM    TAKE these medications         doxazosin 2 MG tablet   Commonly known as: CARDURA   Take 2 mg by mouth at bedtime.      MAXZIDE-25 37.5-25 MG per tablet   Generic drug: triamterene-hydrochlorothiazide   Take 1 tablet by mouth at bedtime.      metFORMIN 500 MG tablet   Commonly known as: GLUCOPHAGE   Take 500 mg by mouth every evening.      oxyCODONE-acetaminophen 5-325 MG per tablet   Commonly known as: PERCOCET/ROXICET   Take 1-2 tablets by mouth every 4 (four) hours as needed (for moderate to severe pain).      potassium chloride 10 MEQ tablet   Commonly known as: K-DUR   Take 1 tablet (10 mEq total) by mouth 2 (two) times daily.      ranitidine 300 MG tablet  Commonly known as: ZANTAC   Take 300 mg by mouth 2 (two) times daily.        Follow-up Information    Please follow up. (GYN Oncology will call you with your final pathology results and to schedule a follow up appointment.)         Signed: CROSS, [name] DEAL 12/15/2011, 9:16 AM

## 2011-12-24 ENCOUNTER — Telehealth: Payer: Self-pay | Admitting: *Deleted

## 2011-12-24 NOTE — Telephone Encounter (Signed)
Patient notified of Path results.  F/U appt  01/05/12 @ 2:15p

## 2012-01-05 ENCOUNTER — Ambulatory Visit: Payer: Managed Care, Other (non HMO) | Attending: Gynecologic Oncology | Admitting: Gynecologic Oncology

## 2012-01-05 ENCOUNTER — Other Ambulatory Visit: Payer: Managed Care, Other (non HMO) | Admitting: Lab

## 2012-01-05 ENCOUNTER — Other Ambulatory Visit: Payer: Self-pay | Admitting: Gynecologic Oncology

## 2012-01-05 ENCOUNTER — Encounter: Payer: Self-pay | Admitting: Gynecologic Oncology

## 2012-01-05 VITALS — BP 132/74 | HR 88 | Temp 98.3°F | Resp 16 | Ht 62.68 in | Wt 185.9 lb

## 2012-01-05 DIAGNOSIS — C541 Malignant neoplasm of endometrium: Secondary | ICD-10-CM

## 2012-01-05 DIAGNOSIS — Z79899 Other long term (current) drug therapy: Secondary | ICD-10-CM | POA: Insufficient documentation

## 2012-01-05 DIAGNOSIS — K219 Gastro-esophageal reflux disease without esophagitis: Secondary | ICD-10-CM | POA: Insufficient documentation

## 2012-01-05 DIAGNOSIS — I10 Essential (primary) hypertension: Secondary | ICD-10-CM | POA: Insufficient documentation

## 2012-01-05 DIAGNOSIS — C549 Malignant neoplasm of corpus uteri, unspecified: Secondary | ICD-10-CM | POA: Insufficient documentation

## 2012-01-05 DIAGNOSIS — R300 Dysuria: Secondary | ICD-10-CM

## 2012-01-05 DIAGNOSIS — Z09 Encounter for follow-up examination after completed treatment for conditions other than malignant neoplasm: Secondary | ICD-10-CM | POA: Insufficient documentation

## 2012-01-05 LAB — URINALYSIS, MICROSCOPIC - CHCC
Glucose: NEGATIVE g/dL
Ketones: NEGATIVE mg/dL
Protein: <30 mg/dL
Specific Gravity, Urine: 1.015 (ref 1.003–1.035)

## 2012-01-05 NOTE — Progress Notes (Signed)
Consult Note: Gyn-Onc  Meagan [name] 66 y.o. female  CC:  Chief Complaint  Patient presents with  . Endometrial cancer    Follow up    HPI: Patient is a 66 year old gravida 1 para 1 with vaginal bleeding since June 2013. She underwent a D&C with Dr. Phylliss Bob mind it revealed a grade 1 endometrial carcinoma. On November 26 of year 2013 she underwent a total robotic hysterectomy bilateral salpingo-oophorectomy bilateral pelvic lymph node dissection. Operative findings included a globular uterus with a 6 cm anterior myoma in a 3 cm small pedunculated myoma. She adhesive disease of the rectosigmoid colon to the left adnexa and the pelvic sidewall. Frozen section revealed no evidence of residual grade 1 endometrial adenocarcinoma. She comes in today for her postoperative evaluation.  Final pathology revealed: ENDOMETRIUM WITH SMALL FOCUS OF HIGHLY ATYPICAL ENDOMETRIAL GLANDS IN THE VICINITY OF PREVIOUS BIOPSY SITE / CURETTAGE CHANGES CONSISTENT WITH FOCAL ATYPICAL COMPLEX HYPERPLASIA, SEE COMMENT. - DEFINITIVE INVASIVE TUMOR OR TUMOR INVOLVING A LYMPH/VASCULAR SPACE IS NOT IDENTIFIED. - ADENOMYOSIS PRESENT. - BENIGN LEIOMYOMATA. - BENIGN CERVIX WITHOUT DYSPLASIA, ATYPIA OR MALIGNANCY IDENTIFIED. - BILATERAL OVARIES SHOW NO SIGNIFICANT PATHOLOGIC ABNORMALITIES. - BILATERAL FALLOPIAN TUBES SHOW NO SIGNIFICANT PATHOLOGIC ABNORMALITIES. 2. Lymph nodes, regional resection, left pelvic - SIX BENIGN LYMPH NODES WITH NO TUMOR SEEN (0/6). 3. Lymph nodes, regional resection, right pelvic - THREE BENIGN LYMPH NODES WITH NO TUMOR SEEN (0/3). Microscopic Comment 1. Deeper sections are cut and examined in the area where the foci of highly atypical glandular cells arepresent. Although definitive carcinoma is not present, the area of focal atypical complex hyperplasiaadjacent to previous biopsy site/curettage changes likely represents the area where the previous carcinomawas removed. Of note, all lymph nodes  which are examined are negative for metastatic carcinoma (seespecimen diagnosis for 2 and 3). The previous case ([id number]) is examined with the current case.Based on the previous case, the tumor is best staged as a pT1a, pN0, MX of FIGO stage IA tumor. BothDr. Dierdre Searles and Dr. Laureen Ochs have seen this case (in conjunction with the previous case) in consultation with agreement that the small focus of atypical endometrial glands (focal atypical complex hyperplasia) likelyrepresents the area where the curettage removed the previously diagnosed tumor.  Interval History:  She is doing very well postoperatively she does not really have any any pain. She'll small scant amount of vaginal bleeding. She has some sensitivity with her bladder but no dysuria. She denies any change in her bowel habits. Her quality of life is excellent and her energy level is steadily improving. She believes that she'll be able to go back to work as scheduled in the early part of January.  Current Meds:  Outpatient Encounter Prescriptions as of 01/05/2012  Medication Sig Dispense Refill  . doxazosin (CARDURA) 2 MG tablet Take 2 mg by mouth at bedtime.      . metFORMIN (GLUCOPHAGE) 500 MG tablet Take 500 mg by mouth every evening.       Marland Kitchen oxyCODONE-acetaminophen (PERCOCET/ROXICET) 5-325 MG per tablet Take 1-2 tablets by mouth every 4 (four) hours as needed (for moderate to severe pain).  45 tablet  0  . potassium chloride (K-DUR) 10 MEQ tablet Take 1 tablet (10 mEq total) by mouth 2 (two) times daily.  6 tablet  0  . ranitidine (ZANTAC) 300 MG tablet Take 300 mg by mouth 2 (two) times daily.      Marland Kitchen triamterene-hydrochlorothiazide (MAXZIDE-25) 37.5-25 MG per tablet Take 1 tablet by mouth at bedtime.  Allergy:  Allergies  Allergen Reactions  . Penicillins Swelling and Other (See Comments)    hallucinations  . Shrimp (Shellfish Allergy) Nausea And Vomiting  . Sinutab Sinus Max St (Phenylephrine-Acetaminophen) Swelling  .  Valacyclovir Other (See Comments)    Confusion    Social Hx:   History   Social History  . Marital Status: Married    Spouse Name: N/A    Number of Children: N/A  . Years of Education: N/A   Occupational History  . Not on file.   Social History Main Topics  . Smoking status: Never Smoker   . Smokeless tobacco: Never Used  . Alcohol Use: No  . Drug Use: No  . Sexually Active: Yes    Birth Control/ Protection: None     Comment: husband had a vasectomy   Other Topics Concern  . Not on file   Social History Narrative  . No narrative on file    Past Surgical Hx:  Past Surgical History  Procedure Date  . Dilation and curettage of uterus   . Wisdom tooth extraction   . Cholecystectomy   . Dilatation & currettage/hysteroscopy with resectocope 11/30/2011    Procedure: DILATATION & CURETTAGE/HYSTEROSCOPY WITH RESECTOCOPE;  Surgeon: Alison Murray, MD;  Location: [facility];  Service: Gynecology;  Laterality: N/A;  . Robotic assisted total hysterectomy with bilateral salpingo oopherectomy 12/14/2011    Procedure: ROBOTIC ASSISTED TOTAL HYSTERECTOMY WITH BILATERAL SALPINGO OOPHORECTOMY;  Surgeon: Rejeana Brock A. Duard Brady, MD;  Location: [facility];  Service: Gynecology;  Laterality: N/A;  . Lymph node dissection 12/14/2011    Procedure: LYMPH NODE DISSECTION;  Surgeon: Rejeana Brock A. Duard Brady, MD;  Location: [facility];  Service: Gynecology;  Laterality: N/A;    Past Medical Hx:  Past Medical History  Diagnosis Date  . SVD (spontaneous vaginal delivery)     x 1  . UTI (lower urinary tract infection) 11/22/11    started abx on 11/24/11  . Hypertension   . Anxiety     no meds  . GERD (gastroesophageal reflux disease)     tx zantac  . H/O hiatal hernia   . Arthritis     knees  . Anemia     hx   . Diabetes mellitus without complication     on oral medication  . Cancer     endometrial cancer    Family Hx:  Family History  Problem Relation Age of Onset  . Skin cancer Sister   . Cancer  Paternal Aunt     Vitals:  Blood pressure 132/74, pulse 88, temperature 98.3 F (36.8 C), temperature source Oral, resp. rate 16, height 5' 2.68" (1.592 m), weight 185 lb 14.4 oz (84.324 kg).  Physical Exam:  Well-nourished well-developed female in no acute distress.  Abdomen well-healed surgical incisions. There is no appreciable incisional hernias. Abdomen is soft and nontender.  Extremities: Minimal edema.  Pelvic: Normal external female genitalia. The vaginal cuff is intact. There's a scant amount of pink discharge. Bimanual examination reveals no cuff tenderness or fluctuance.  Assessment/Plan: 66 year old with a stage IA grade 1 endometrial carcinoma is doing very well postoperatively. She returned to work in 3 weeks as scheduled. She was asked to refrain from intercourse until 8 weeks after her surgery. She's also asked to refrain from heavy lifting at work for that period of time. We discussed the followup and I will alternate visits between our service and Dr. Tresa Res every 6 months. She'll return to see me in 6 months and  then follow up with Dr. Tresa Res in 6 months after that. The rationale for pelvic examinations was discussed with the patient. She is very thrilled with her pathology report. She will call us if she has any issues prior to her next visit.  Cleda Mccreedy A., MD 01/05/2012, 2:45 PM

## 2012-01-05 NOTE — Addendum Note (Signed)
Addended by: Warner Mccreedy D on: 01/05/2012 04:23 PM   Modules accepted: Orders

## 2012-01-05 NOTE — Patient Instructions (Signed)
RTC 6 months

## 2012-01-06 ENCOUNTER — Other Ambulatory Visit: Payer: Self-pay | Admitting: Gynecologic Oncology

## 2012-01-06 DIAGNOSIS — R300 Dysuria: Secondary | ICD-10-CM

## 2012-01-06 MED ORDER — NITROFURANTOIN MONOHYD MACRO 100 MG PO CAPS
100.0000 mg | ORAL_CAPSULE | Freq: Two times a day (BID) | ORAL | Status: DC
Start: 2012-01-06 — End: 2012-04-18

## 2012-01-06 NOTE — Progress Notes (Signed)
Pt informed of initial UA results.  Reporting that at the end of Oct 2013 she was treated for a UTI by Dr. Tresa Res.  She initially took Cipro and was then switched to Macrobid per Kennon Rounds at Dr. Harlene Salts office since the infection, e coli, was resistant to Cipro.  Culture was ordered for the urine sample from 01/05/12.  Macrobid 100 mg BID for 7 days sent to [facility] on [address].  Pt is instructed to take as prescribed and that she would be notified when the culture results returned if she needed to be switched to a different antibiotic.  Proper bladder habits discussed and maintaining adequate hydration reinforced.  Instructed to call for any questions or concerns.

## 2012-01-08 LAB — URINE CULTURE

## 2012-01-13 ENCOUNTER — Telehealth: Payer: Self-pay | Admitting: Gynecologic Oncology

## 2012-01-13 NOTE — Telephone Encounter (Signed)
Spoke with patient about culture results.  Reinforced proper urinary habits.  Denies symptoms at this time.  Instructed to complete antibiotic as ordered and to call if symptoms return.  Verbalizing understanding.

## 2012-01-25 ENCOUNTER — Encounter: Payer: Self-pay | Admitting: Gynecologic Oncology

## 2012-04-17 ENCOUNTER — Telehealth: Payer: Self-pay | Admitting: Gynecologic Oncology

## 2012-04-17 NOTE — Telephone Encounter (Signed)
Patient called stating "I think I have a urinary tract infection again."  She reports irritation around her vagina.  She denies itching, vaginal discharge, or vaginal bleeding.  States that she can "feel the urine when it is coming down and I feel like I am not emptying my bladder."  Instructed that she would need give a urine sample for analysis and culture.  Stating that she would be able to come in tomorrow for assessment.  Appointment made.  Instructed to call for any needs before that time.

## 2012-04-18 ENCOUNTER — Ambulatory Visit: Payer: Managed Care, Other (non HMO) | Admitting: Lab

## 2012-04-18 ENCOUNTER — Ambulatory Visit: Payer: Managed Care, Other (non HMO) | Attending: Gynecologic Oncology | Admitting: Gynecologic Oncology

## 2012-04-18 ENCOUNTER — Telehealth: Payer: Self-pay | Admitting: Gynecologic Oncology

## 2012-04-18 ENCOUNTER — Encounter: Payer: Self-pay | Admitting: Gynecologic Oncology

## 2012-04-18 VITALS — BP 142/80 | HR 82 | Temp 98.6°F | Resp 16 | Ht 62.17 in | Wt 187.7 lb

## 2012-04-18 DIAGNOSIS — B3731 Acute candidiasis of vulva and vagina: Secondary | ICD-10-CM | POA: Insufficient documentation

## 2012-04-18 DIAGNOSIS — C541 Malignant neoplasm of endometrium: Secondary | ICD-10-CM

## 2012-04-18 DIAGNOSIS — C549 Malignant neoplasm of corpus uteri, unspecified: Secondary | ICD-10-CM | POA: Insufficient documentation

## 2012-04-18 DIAGNOSIS — R300 Dysuria: Secondary | ICD-10-CM

## 2012-04-18 DIAGNOSIS — B373 Candidiasis of vulva and vagina: Secondary | ICD-10-CM | POA: Insufficient documentation

## 2012-04-18 DIAGNOSIS — N390 Urinary tract infection, site not specified: Secondary | ICD-10-CM

## 2012-04-18 LAB — URINALYSIS, MICROSCOPIC - CHCC
Blood: NEGATIVE
Glucose: NEGATIVE mg/dL
Nitrite: NEGATIVE
Protein: NEGATIVE mg/dL
RBC / HPF: NEGATIVE (ref 0–2)
Specific Gravity, Urine: 1.005 (ref 1.003–1.035)
Urobilinogen, UR: 0.2 mg/dL (ref 0.2–1)

## 2012-04-18 MED ORDER — FLUCONAZOLE 100 MG PO TABS: 100.0000 mg | ORAL_TABLET | Freq: Every day | ORAL | Status: DC

## 2012-04-18 MED ORDER — NITROFURANTOIN MONOHYD MACRO 100 MG PO CAPS: 100.0000 mg | ORAL_CAPSULE | Freq: Two times a day (BID) | ORAL | Status: DC

## 2012-04-18 NOTE — Patient Instructions (Addendum)
Urinary Tract Infection Urinary tract infections (UTIs) can develop anywhere along your urinary tract. Your urinary tract is your body's drainage system for removing wastes and extra water. Your urinary tract includes two kidneys, two ureters, a bladder, and a urethra. Your kidneys are a pair of bean-shaped organs. Each kidney is about the size of your fist. They are located below your ribs, one on each side of your spine. CAUSES Infections are caused by microbes, which are microscopic organisms, including fungi, viruses, and bacteria. These organisms are so small that they can only be seen through a microscope. Bacteria are the microbes that most commonly cause UTIs. SYMPTOMS  Symptoms of UTIs may vary by age and gender of the patient and by the location of the infection. Symptoms in young women typically include a frequent and intense urge to urinate and a painful, burning feeling in the bladder or urethra during urination. Older women and men are more likely to be tired, shaky, and weak and have muscle aches and abdominal pain. A fever may mean the infection is in your kidneys. Other symptoms of a kidney infection include pain in your back or sides below the ribs, nausea, and vomiting. DIAGNOSIS To diagnose a UTI, your caregiver will ask you about your symptoms. Your caregiver also will ask to provide a urine sample. The urine sample will be tested for bacteria and white blood cells. White blood cells are made by your body to help fight infection. TREATMENT  Typically, UTIs can be treated with medication. Because most UTIs are caused by a bacterial infection, they usually can be treated with the use of antibiotics. The choice of antibiotic and length of treatment depend on your symptoms and the type of bacteria causing your infection. HOME CARE INSTRUCTIONS  If you were prescribed antibiotics, take them exactly as your caregiver instructs you. Finish the medication even if you feel better after you  have only taken some of the medication.  Drink enough water and fluids to keep your urine clear or pale yellow.  Avoid caffeine, tea, and carbonated beverages. They tend to irritate your bladder.  Empty your bladder often. Avoid holding urine for long periods of time.  Empty your bladder before and after sexual intercourse.  After a bowel movement, women should cleanse from front to back. Use each tissue only once. SEEK MEDICAL CARE IF:   You have back pain.  You develop a fever.  Your symptoms do not begin to resolve within 3 days. SEEK IMMEDIATE MEDICAL CARE IF:   You have severe back pain or lower abdominal pain.  You develop chills.  You have nausea or vomiting.  You have continued burning or discomfort with urination. MAKE SURE YOU:   Understand these instructions.  Will watch your condition.  Will get help right away if you are not doing well or get worse. Document Released: 10/14/2004 Document Revised: 07/06/2011 Document Reviewed: 02/12/2011 [facility] Patient Information 2013 [place], [place].   Yeast Infection of the Skin Some yeast on the skin is normal, but sometimes it causes an infection. If you have a yeast infection, it shows up as white or light brown patches on brown skin. You can see it better in the summer on tan skin. It causes light-colored holes in your suntan. It can happen on any area of the body. This cannot be passed from person to person. HOME CARE  Scrub your skin daily with a dandruff shampoo. Your rash may take a couple weeks to get well.  Do  not scratch or itch the rash. GET HELP RIGHT AWAY IF:   You get another infection from scratching. The skin may get warm, red, and may ooze fluid.  The infection does not seem to be getting better. MAKE SURE YOU:  Understand these instructions.  Will watch your condition.  Will get help right away if you are not doing well or get worse. Document Released: 12/18/2007 Document Revised: 03/29/2011  Document Reviewed: 12/18/2007 [facility] Patient Information 2013 [place], [place].  Nitrofurantoin tablets or capsules What is this medicine? NITROFURANTOIN (nye troe fyoor AN toyn) is an antibiotic. It is used to treat urinary tract infections. This medicine may be used for other purposes; ask your health care provider or pharmacist if you have questions. What should I tell my health care provider before I take this medicine? They need to know if you have any of these conditions: -anemia -diabetes -glucose-6-phosphate dehydrogenase deficiency -kidney disease -liver disease -lung disease -other chronic illness -an unusual or allergic reaction to nitrofurantoin, other antibiotics, other medicines, foods, dyes or preservatives -pregnant or trying to get pregnant -breast-feeding How should I use this medicine? Take this medicine by mouth with a glass of water. Follow the directions on the prescription label. Take this medicine with food or milk. Take your doses at regular intervals. Do not take your medicine more often than directed. Do not stop taking except on your doctor's advice. Talk to your pediatrician regarding the use of this medicine in children. While this drug may be prescribed for selected conditions, precautions do apply. Overdosage: If you think you have taken too much of this medicine contact a poison control center or emergency room at once. NOTE: This medicine is only for you. Do not share this medicine with others. What if I miss a dose? If you miss a dose, take it as soon as you can. If it is almost time for your next dose, take only that dose. Do not take double or extra doses. What may interact with this medicine? -antacids containing magnesium trisilicate -probenecid -quinolone antibiotics like ciprofloxacin, lomefloxacin, norfloxacin and ofloxacin -sulfinpyrazone This list may not describe all possible interactions. Give your health care provider a list of all the  medicines, herbs, non-prescription drugs, or dietary supplements you use. Also tell them if you smoke, drink alcohol, or use illegal drugs. Some items may interact with your medicine. What should I watch for while using this medicine? Tell your doctor or health care professional if your symptoms do not improve or if you get new symptoms. Drink several glasses of water a day. If you are taking this medicine for a long time, visit your doctor for regular checks on your progress. If you are diabetic, you may get a false positive result for sugar in your urine with certain brands of urine tests. Check with your doctor. What side effects may I notice from receiving this medicine? Side effects that you should report to your doctor or health care professional as soon as possible: -allergic reactions like skin rash or hives, swelling of the face, lips, or tongue -chest pain -cough -difficulty breathing -dizziness, drowsiness -fever or infection -joint aches or pains -pale or blue-tinted skin -redness, blistering, peeling or loosening of the skin, including inside the mouth -tingling, burning, pain, or numbness in hands or feet -unusual bleeding or bruising -unusually weak or tired -yellowing of eyes or skin Side effects that usually do not require medical attention (report to your doctor or health care professional if they continue or  are bothersome): -dark urine -diarrhea -headache -loss of appetite -nausea or vomiting -temporary hair loss This list may not describe all possible side effects. Call your doctor for medical advice about side effects. You may report side effects to FDA at 1-800-FDA-1088. Where should I keep my medicine? Keep out of the reach of children. Store at room temperature between 15 and 30 degrees C (59 and 86 degrees F). Protect from light. Throw away any unused medicine after the expiration date. NOTE: This sheet is a summary. It may not cover all possible information.  If you have questions about this medicine, talk to your doctor, pharmacist, or health care provider.  2013, [employer]. (07/26/2007 3:56:47 PM) Fluconazole tablets What is this medicine? FLUCONAZOLE (floo KON na zole) is an antifungal medicine. It is used to treat certain kinds of fungal or yeast infections. This medicine may be used for other purposes; ask your health care provider or pharmacist if you have questions. What should I tell my health care provider before I take this medicine? They need to know if you have any of these conditions: -electrolyte abnormalities -history of irregular heart beat -kidney disease -an unusual or allergic reaction to fluconazole, other azole antifungals, medicines, foods, dyes, or preservatives -pregnant or trying to get pregnant -breast-feeding How should I use this medicine? Take this medicine by mouth. Follow the directions on the prescription label. Do not take your medicine more often than directed. Talk to your pediatrician regarding the use of this medicine in children. Special care may be needed. This medicine has been used in children as young as 18 months of age. Overdosage: If you think you have taken too much of this medicine contact a poison control center or emergency room at once. NOTE: This medicine is only for you. Do not share this medicine with others. What if I miss a dose? If you miss a dose, take it as soon as you can. If it is almost time for your next dose, take only that dose. Do not take double or extra doses. What may interact with this medicine? Do not take this medicine with any of the following medications: -cisapride -pimozide -red yeast rice This medicine may also interact with the following medications: -birth control pills -cyclosporine -diuretics like hydrochlorothiazide -medicines for diabetes that are taken by mouth -medicines for high cholesterol like atorvastatin, lovastatin or  simvastatin -phenytoin -ramelteon -rifabutin -rifampin -some medicines for anxiety or sleep -tacrolimus -terfenadine -theophylline -warfarin This list may not describe all possible interactions. Give your health care provider a list of all the medicines, herbs, non-prescription drugs, or dietary supplements you use. Also tell them if you smoke, drink alcohol, or use illegal drugs. Some items may interact with your medicine. What should I watch for while using this medicine? Visit your doctor or health care professional for regular checkups. If you are taking this medicine for a long time you may need blood work. Tell your doctor if your symptoms do not improve. Some fungal infections need many weeks or months of treatment to cure. Alcohol can increase possible damage to your liver. Avoid alcoholic drinks. If you have a vaginal infection, do not have sex until you have finished your treatment. You can wear a sanitary napkin. Do not use tampons. Wear freshly washed cotton, not synthetic, panties. What side effects may I notice from receiving this medicine? Side effects that you should report to your doctor or health care professional as soon as possible: -allergic reactions like skin rash or  itching, hives, swelling of the lips, mouth, tongue, or throat -dark urine -feeling dizzy or faint -irregular heartbeat or chest pain -redness, blistering, peeling or loosening of the skin, including inside the mouth -trouble breathing -unusual bruising or bleeding -vomiting -yellowing of the eyes or skin Side effects that usually do not require medical attention (report to your doctor or health care professional if they continue or are bothersome): -changes in how food tastes -diarrhea -headache -stomach upset or nausea This list may not describe all possible side effects. Call your doctor for medical advice about side effects. You may report side effects to FDA at 1-800-FDA-1088. Where should I  keep my medicine? Keep out of the reach of children. Store at room temperature below 30 degrees C (86 degrees F). Throw away any medicine after the expiration date. NOTE: This sheet is a summary. It may not cover all possible information. If you have questions about this medicine, talk to your doctor, pharmacist, or health care provider.  2013, [employer]. (05/15/2007 4:59:25 PM)

## 2012-04-18 NOTE — Addendum Note (Signed)
Addended by: Warner Mccreedy D on: 04/18/2012 01:45 PM   Modules accepted: Orders, Medications

## 2012-04-18 NOTE — Progress Notes (Signed)
Follow Up Note: Gyn-Onc  Art Buff 67 y.o. female  CC:  Chief Complaint  Patient presents with  . Endometrial cancer    Follow up    HPI:  Meagan [name] is a 68 year old, gravida 1 para 1, with vaginal bleeding since June 2013. She underwent a D&C with Dr. Phylliss Bob, which revealed a grade 1 endometrial carcinoma. On December 14, 2011, she underwent a total robotic hysterectomy bilateral salpingo-oophorectomy bilateral pelvic lymph node dissection. Operative findings included a globular uterus with a 6 cm anterior myoma in a 3 cm small pedunculated myoma. She had adhesive disease of the rectosigmoid colon to the left adnexa and the pelvic sidewall. Frozen section revealed no evidence of residual grade 1 endometrial adenocarcinoma.  Final pathology revealed: ENDOMETRIUM WITH SMALL FOCUS OF HIGHLY ATYPICAL ENDOMETRIAL GLANDS IN THE VICINITY OF PREVIOUS BIOPSY SITE / CURETTAGE CHANGES CONSISTENT WITH FOCAL ATYPICAL COMPLEX HYPERPLASIA, SEE COMMENT. - DEFINITIVE INVASIVE TUMOR OR TUMOR INVOLVING A LYMPH/VASCULAR SPACE IS NOT IDENTIFIED. - ADENOMYOSIS PRESENT. - BENIGN LEIOMYOMATA. - BENIGN CERVIX WITHOUT DYSPLASIA, ATYPIA OR MALIGNANCY IDENTIFIED. - BILATERAL OVARIES SHOW NO SIGNIFICANT PATHOLOGIC ABNORMALITIES. - BILATERAL FALLOPIAN TUBES SHOW NO SIGNIFICANT PATHOLOGIC ABNORMALITIES. 2. Lymph nodes, regional resection, left pelvic - SIX BENIGN LYMPH NODES WITH NO TUMOR SEEN (0/6). 3. Lymph nodes, regional resection, right pelvic - THREE BENIGN LYMPH NODES WITH NO TUMOR SEEN (0/3).  Her post-operative course was uneventful.  She was treated for a urinary tract infection in December 2013 with Macrobid 100 mg BID for 7 days with her urine culture resulting greater than 100,000 colonies/ml of e coli.  Interval History:  She presents today with complaints of "feelings like my urinary infection is back."  She states that she can  feel her urine when it is coming from her bladder and that she has to sit for some time to completely empty her bladder.  She denies fever, chills, dysuria, hematuria, frequency, urgency, vaginal bleeding, vaginal discharge, or rectal bleeding.  She reports intermittent right upper back pain that was present prior to surgery in November 2013.  She denies a history of kidney stones and reports that she completed her full course of antibiotics in December.  She reports irritation around the introitus and denies itching, burning, or discharge of any kind.  She reports pain when attempting intercourse after her surgery.  She reports that her blood glucose has been running higher because she has "been eating sweets" and not following her recommended diabetic diet.  Her last blood glucose reading was 205 fasting in the am.  She reports having an upcoming appointment with her primary care provider in April 2014.  No other concerns voiced.  Review of Systems  A comprehensive 10 point review of systems was negative except for irritation around the introitus, intermittent right upper back pain, and "feelings of urine moving slowly from the bladder."  She denies dysuria, hematuria, frequency, urgency, vaginal bleeding, or vaginal discharge.   Current Meds:  Outpatient Encounter Prescriptions as of 04/18/2012  Medication Sig Dispense Refill  . doxazosin (CARDURA) 2 MG tablet Take 2 mg by mouth at bedtime.      . metFORMIN (GLUCOPHAGE) 500 MG tablet Take by mouth every evening. Takes 1000 mg in the a.m. And 500 mg in the pm      . ranitidine (ZANTAC) 300 MG tablet Take 300 mg by mouth 2 (two) times daily.      Marland Kitchen triamterene-hydrochlorothiazide (MAXZIDE-25) 37.5-25 MG per tablet Take 1 tablet by mouth  at bedtime.       . fluconazole (DIFLUCAN) 100 MG tablet Take 1 tablet (100 mg total) by mouth daily.  2 tablet  0  . nitrofurantoin, macrocrystal-monohydrate, (MACROBID) 100 MG capsule Take 1 capsule (100 mg total) by  mouth 2 (two) times daily.  14 capsule  0  . oxyCODONE-acetaminophen (PERCOCET/ROXICET) 5-325 MG per tablet Take 1-2 tablets by mouth every 4 (four) hours as needed (for moderate to severe pain).  45 tablet  0  . potassium chloride (K-DUR) 10 MEQ tablet Take 1 tablet (10 mEq total) by mouth 2 (two) times daily.  6 tablet  0  . [DISCONTINUED] nitrofurantoin, macrocrystal-monohydrate, (MACROBID) 100 MG capsule Take 1 capsule (100 mg total) by mouth 2 (two) times daily.  14 capsule  0   No facility-administered encounter medications on file as of 04/18/2012.    Allergy:  Allergies  Allergen Reactions  . Penicillins Swelling and Other (See Comments)    hallucinations  . Shrimp (Shellfish Allergy) Nausea And Vomiting  . Sinutab Sinus Max St (Phenylephrine-Acetaminophen) Swelling  . Valacyclovir Other (See Comments)    Confusion    Social Hx:   History   Social History  . Marital Status: Married    Spouse Name: N/A    Number of Children: N/A  . Years of Education: N/A   Occupational History  . Not on file.   Social History Main Topics  . Smoking status: Never Smoker   . Smokeless tobacco: Never Used  . Alcohol Use: No  . Drug Use: No  . Sexually Active: Yes    Birth Control/ Protection: None     Comment: husband had a vasectomy   Other Topics Concern  . Not on file   Social History Narrative  . No narrative on file    Past Surgical Hx:  Past Surgical History  Procedure Laterality Date  . Dilation and curettage of uterus    . Wisdom tooth extraction    . Cholecystectomy    . Dilatation & currettage/hysteroscopy with resectocope  11/30/2011    Procedure: DILATATION & CURETTAGE/HYSTEROSCOPY WITH RESECTOCOPE;  Surgeon: Alison Murray, MD;  Location: [facility];  Service: Gynecology;  Laterality: N/A;  . Robotic assisted total hysterectomy with bilateral salpingo oopherectomy  12/14/2011    Procedure: ROBOTIC ASSISTED TOTAL HYSTERECTOMY WITH BILATERAL SALPINGO OOPHORECTOMY;   Surgeon: Rejeana Brock A. Duard Brady, MD;  Location: [facility];  Service: Gynecology;  Laterality: N/A;  . Lymph node dissection  12/14/2011    Procedure: LYMPH NODE DISSECTION;  Surgeon: Rejeana Brock A. Duard Brady, MD;  Location: [facility];  Service: Gynecology;  Laterality: N/A;    Past Medical Hx:  Past Medical History  Diagnosis Date  . SVD (spontaneous vaginal delivery)     x 1  . UTI (lower urinary tract infection) 11/22/11    started abx on 11/24/11  . Hypertension   . Anxiety     no meds  . GERD (gastroesophageal reflux disease)     tx zantac  . H/O hiatal hernia   . Arthritis     knees  . Anemia     hx   . Diabetes mellitus without complication     on oral medication  . Cancer     endometrial cancer    Family Hx:  Family History  Problem Relation Age of Onset  . Skin cancer Sister   . Cancer Paternal Aunt     Vitals:  Blood pressure 142/80, pulse 82, temperature 98.6 F (37 C), temperature  source Oral, resp. rate 16, height 5' 2.17" (1.579 m), weight 187 lb 11.2 oz (85.14 kg).  Physical Exam:  General: Well developed, well nourished female in no acute distress. Alert and oriented x 3.  Neck: Supple without any enlargements.  Lymph node survey: No cervical, supraclavicular, or inguinal adenopathy  Cardiovascular: Regular rate and rhythm. S1 and S2 normal.  Lungs: Clear to auscultation bilaterally. No wheezes/crackles/rhonchi noted.  Skin: No rashes or lesions present. Back: No CVA tenderness.  Abdomen: Abdomen soft, non-tender and obese. Active bowel sounds in all quadrants. No evidence of a fluid wave or abdominal masses.  Genitourinary:    Vulva/vagina: Normal external female genitalia. No lesions.    Urethra: No lesions or masses.    Vagina: White, patchy candida-like areas noted around the introitus with erythema present.  Vagina atrophic without any lesions.  No vaginal bleeding or drainage noted.  Extremities: No bilateral cyanosis, edema, or clubbing.   Assessment/Plan:  67 year  old with a Stage IA grade 1 endometrial carcinoma.  We will obtain a urine sample today for analysis and culture.  More than half of the visit was spent counseling the patient on proper urinary habits and improving her glycemic control.  She is to follow up as previously advised by Dr. Duard Brady in June 2014 and with Dr. Tresa Res in six months after that.  She is advised to call for any questions or concerns and if symptoms persist or worsen.  Reportable signs and symptoms reviewed.     Richardo Popoff DEAL, NP 04/18/2012, 12:04 PM

## 2012-04-18 NOTE — Telephone Encounter (Signed)
Patient informed of urinalysis results.  Instructed to take her Diflucan as prescribed and to use the antifungal powder to the area of yeast around the introitus.  Instructed that she would be contacted with final culture results.  Instructed to call for any needs or questions before that time.  Improving glycemic control reinforced.  Instructed that no antibiotics are needed at this time and that we would follow up with final culture results.

## 2012-04-21 ENCOUNTER — Telehealth: Payer: Self-pay | Admitting: Gynecologic Oncology

## 2012-04-21 LAB — URINE CULTURE

## 2012-04-21 NOTE — Telephone Encounter (Signed)
Called to check with the patient about her current status.  She reports that she is feeling better after taking a diflucan tablet.  She reports "hurting less" with her urinary symptoms resolving.  Instructed that we would contact her with the results of her urine culture when available and for her to call for any needs or concerns.  Verbalizing understanding.

## 2012-04-26 ENCOUNTER — Telehealth: Payer: Self-pay | Admitting: Gynecologic Oncology

## 2012-04-26 NOTE — Telephone Encounter (Signed)
Called to check on the patient's status and to inform her of her urine culture results.  She reports feeling better, with no irritation, erythema, or white yeast-like areas around the introitus.  She states that she has not checked her blood sugar this am but they have "been running about the same."  The importance of improving glycemic control stressed.  Pt verbalizing understanding.  Instructed to call for any needs or concerns.

## 2012-06-29 ENCOUNTER — Encounter: Payer: Self-pay | Admitting: Gynecologic Oncology

## 2012-06-29 ENCOUNTER — Ambulatory Visit: Payer: Medicare Other | Attending: Gynecologic Oncology | Admitting: Gynecologic Oncology

## 2012-06-29 VITALS — BP 140/78 | HR 104 | Temp 98.8°F | Resp 16 | Ht 62.68 in | Wt 185.5 lb

## 2012-06-29 DIAGNOSIS — E119 Type 2 diabetes mellitus without complications: Secondary | ICD-10-CM | POA: Insufficient documentation

## 2012-06-29 DIAGNOSIS — Z79899 Other long term (current) drug therapy: Secondary | ICD-10-CM | POA: Insufficient documentation

## 2012-06-29 DIAGNOSIS — Z9089 Acquired absence of other organs: Secondary | ICD-10-CM | POA: Insufficient documentation

## 2012-06-29 DIAGNOSIS — IMO0002 Reserved for concepts with insufficient information to code with codable children: Secondary | ICD-10-CM | POA: Insufficient documentation

## 2012-06-29 DIAGNOSIS — Z90710 Acquired absence of both cervix and uterus: Secondary | ICD-10-CM | POA: Insufficient documentation

## 2012-06-29 DIAGNOSIS — Z9079 Acquired absence of other genital organ(s): Secondary | ICD-10-CM | POA: Insufficient documentation

## 2012-06-29 DIAGNOSIS — C541 Malignant neoplasm of endometrium: Secondary | ICD-10-CM

## 2012-06-29 DIAGNOSIS — C549 Malignant neoplasm of corpus uteri, unspecified: Secondary | ICD-10-CM | POA: Insufficient documentation

## 2012-06-29 DIAGNOSIS — I10 Essential (primary) hypertension: Secondary | ICD-10-CM | POA: Insufficient documentation

## 2012-06-29 DIAGNOSIS — K219 Gastro-esophageal reflux disease without esophagitis: Secondary | ICD-10-CM | POA: Insufficient documentation

## 2012-06-29 NOTE — Progress Notes (Signed)
Pt with h/o endometrial cancer.  Needs 6 month f/u with [place].  Please call her and let her know about Dr. Precious Bard.  If she is comfortable with it, I'd like to see her for f/u 6 months.  Needs to go into recall please.

## 2012-06-29 NOTE — Progress Notes (Signed)
Consult Note: Gyn-Onc  Meagan [name] 67 y.o. female  CC:  Chief Complaint  Patient presents with  . Endometrial cancer    Follow up    HPI: Patient is a 67 year old gravida 1 para 1 with vaginal bleeding since June 2013. She underwent a D&C with Dr. Tresa Res it revealed a grade 1 endometrial carcinoma. On November 26 of year 2013 she underwent a total robotic hysterectomy bilateral salpingo-oophorectomy bilateral pelvic lymph node dissection. Operative findings included a globular uterus with a 6 cm anterior myoma in a 3 cm small pedunculated myoma. She adhesive disease of the rectosigmoid colon to the left adnexa and the pelvic sidewall. Frozen section revealed no evidence of residual grade 1 endometrial adenocarcinoma.  Final pathology revealed: ENDOMETRIUM WITH SMALL FOCUS OF HIGHLY ATYPICAL ENDOMETRIAL GLANDS IN THE VICINITY OF PREVIOUS BIOPSY SITE / CURETTAGE CHANGES CONSISTENT WITH FOCAL ATYPICAL COMPLEX HYPERPLASIA, SEE COMMENT. - DEFINITIVE INVASIVE TUMOR OR TUMOR INVOLVING A LYMPH/VASCULAR SPACE IS NOT IDENTIFIED. - ADENOMYOSIS PRESENT. - BENIGN LEIOMYOMATA. - BENIGN CERVIX WITHOUT DYSPLASIA, ATYPIA OR MALIGNANCY IDENTIFIED. - BILATERAL OVARIES SHOW NO SIGNIFICANT PATHOLOGIC ABNORMALITIES. - BILATERAL FALLOPIAN TUBES SHOW NO SIGNIFICANT PATHOLOGIC ABNORMALITIES. 2. Lymph nodes, regional resection, left pelvic - SIX BENIGN LYMPH NODES WITH NO TUMOR SEEN (0/6). 3. Lymph nodes, regional resection, right pelvic - THREE BENIGN LYMPH NODES WITH NO TUMOR SEEN (0/3). Microscopic Comment 1. Deeper sections are cut and examined in the area where the foci of highly atypical glandular cells arepresent. Although definitive carcinoma is not present, the area of focal atypical complex hyperplasiaadjacent to previous biopsy site/curettage changes likely represents the area where the previous carcinomawas removed. Of note, all lymph nodes which are examined are negative for metastatic  carcinoma (seespecimen diagnosis for 2 and 3). The previous case ([id number]) is examined with the current case.Based on the previous case, the tumor is best staged as a pT1a, pN0, MX of FIGO stage IA tumor. BothDr. Dierdre Searles and Dr. Laureen Ochs have seen this case (in conjunction with the previous case) in consultation with agreement that the small focus of atypical endometrial glands (focal atypical complex hyperplasia) likely represents the area where the curettage removed the previously diagnosed tumor.  Interval History:  She is overall doing very well. She stay she's very anxious and nervous about her first surveillance visit today. She does complain of vaginal dryness and some pain with intercourse. She states that the discomfort seems to be very much external not necessarily "inside". She does feel "raw" on the outside. They have not use any lubricants. There's been no bleeding. She denies any change in her bowel or bladder habits. She denies any nausea, vomiting, fevers, chills. Her metformin dose was increased as her sugars were in the mid 200s. As a result she's had some loose stools but no significant change in her bowel habits. Her sugars are now doing better in the 160s to 170s range. She has lost 2 pounds since we last saw her in April. She has a followup with Dr. Holley Bouche  (her primary MD) in about 2 months. She denies any chest pain or shortness of breath. The remainder for 10 point review of systems is negative.  Current Meds:  Outpatient Encounter Prescriptions as of 06/29/2012  Medication Sig Dispense Refill  . doxazosin (CARDURA) 2 MG tablet Take 2 mg by mouth at bedtime.      . metFORMIN (GLUCOPHAGE) 500 MG tablet Take 1,000 mg by mouth every evening. Takes 1000 mg in the a.m. And 1000 mg  in the pm      . ranitidine (ZANTAC) 300 MG tablet Take 300 mg by mouth 2 (two) times daily.      Meagan [name] Kitchen triamterene-hydrochlorothiazide (MAXZIDE-25) 37.5-25 MG per tablet Take 1 tablet by mouth at bedtime.        . fluconazole (DIFLUCAN) 100 MG tablet Take 1 tablet (100 mg total) by mouth daily.  2 tablet  0  . oxyCODONE-acetaminophen (PERCOCET/ROXICET) 5-325 MG per tablet Take 1-2 tablets by mouth every 4 (four) hours as needed (for moderate to severe pain).  45 tablet  0  . potassium chloride (K-DUR) 10 MEQ tablet Take 1 tablet (10 mEq total) by mouth 2 (two) times daily.  6 tablet  0   No facility-administered encounter medications on file as of 06/29/2012.    Allergy:  Allergies  Allergen Reactions  . Penicillins Swelling and Other (See Comments)    hallucinations  . Shrimp (Shellfish Allergy) Nausea And Vomiting  . Sinutab Sinus Max St (Phenylephrine-Acetaminophen) Swelling  . Valacyclovir Other (See Comments)    Confusion    Social Hx:   History   Social History  . Marital Status: Married    Spouse Name: N/A    Number of Children: N/A  . Years of Education: N/A   Occupational History  . Not on file.   Social History Main Topics  . Smoking status: Never Smoker   . Smokeless tobacco: Never Used  . Alcohol Use: No  . Drug Use: No  . Sexually Active: Yes    Birth Control/ Protection: None     Comment: husband had a vasectomy   Other Topics Concern  . Not on file   Social History Narrative  . No narrative on file    Past Surgical Hx:  Past Surgical History  Procedure Laterality Date  . Dilation and curettage of uterus    . Wisdom tooth extraction    . Cholecystectomy    . Dilatation & currettage/hysteroscopy with resectocope  11/30/2011    Procedure: DILATATION & CURETTAGE/HYSTEROSCOPY WITH RESECTOCOPE;  Surgeon: Alison Murray, MD;  Location: [facility];  Service: Gynecology;  Laterality: N/A;  . Robotic assisted total hysterectomy with bilateral salpingo oopherectomy  12/14/2011    Procedure: ROBOTIC ASSISTED TOTAL HYSTERECTOMY WITH BILATERAL SALPINGO OOPHORECTOMY;  Surgeon: Rejeana Brock A. Duard Brady, MD;  Location: [facility];  Service: Gynecology;  Laterality: N/A;  . Lymph  node dissection  12/14/2011    Procedure: LYMPH NODE DISSECTION;  Surgeon: Rejeana Brock A. Duard Brady, MD;  Location: [facility];  Service: Gynecology;  Laterality: N/A;    Past Medical Hx:  Past Medical History  Diagnosis Date  . SVD (spontaneous vaginal delivery)     x 1  . UTI (lower urinary tract infection) 11/22/11    started abx on 11/24/11  . Hypertension   . Anxiety     no meds  . GERD (gastroesophageal reflux disease)     tx zantac  . H/O hiatal hernia   . Arthritis     knees  . Anemia     hx   . Diabetes mellitus without complication     on oral medication  . Cancer     endometrial cancer    Family Hx:  Family History  Problem Relation Age of Onset  . Skin cancer Sister   . Cancer Paternal Aunt     Vitals:  Blood pressure 140/78, pulse 104, temperature 98.8 F (37.1 C), temperature source Oral, resp. rate 16, height 5' 2.68" (1.592 m), weight  185 lb 8 oz (84.142 kg).  Physical Exam:  Well-nourished well-developed female in no acute distress.  Neck: Supple, no lymphadenopathy no thyromegaly.  Lungs: Clear to auscultation but he.  Cardiovascular: Regular rate and rhythm.  Abdomen well-healed surgical incisions. There is no appreciable incisional hernias. Abdomen is soft and nontender. There are no palpable masses or hepatosplenomegaly. Exam is somewhat limited by habitus.  Groins: No edema  Extremities: Minimal edema.  Pelvic: Normal external female genitalia. The vaginal cuff is intact with no visible lesions or discharge. Bimanual examination was no masses or nodularity. Rectal confirms.  Assessment/Plan: 67 year old with a stage IA grade 1 endometrial carcinoma. She has no evidence of recurrent disease. She will see Dr. Tresa Res in 6 months and return to see [place] in 1 year. Options for lubricants were discussed with the patient.  Gloristine Turrubiates A., MD 06/29/2012, 1:22 PM

## 2012-07-05 NOTE — Progress Notes (Signed)
Okey Regal, Please let pt know we received note from Dr. Duard Brady.  She needs f/u 6 months.  If she is ok scheduling with me, please schedule.  Thanks.

## 2012-07-10 ENCOUNTER — Telehealth: Payer: Self-pay | Admitting: Gynecologic Oncology

## 2012-07-10 NOTE — Telephone Encounter (Signed)
Patient called with complaints of internal and external vaginal itching.  Denies discharge.  "I think I have another UTI."  C/o intermittent dysuria.  Denies fever or hematuria.  Appt made for tomorrow since patient could not come in today to give urine specimen.  Reporting blood sugars in the 100s.  Instructed to call for any needs.

## 2012-07-11 ENCOUNTER — Encounter: Payer: Self-pay | Admitting: Gynecologic Oncology

## 2012-07-11 ENCOUNTER — Other Ambulatory Visit (HOSPITAL_COMMUNITY)
Admission: RE | Admit: 2012-07-11 | Discharge: 2012-07-11 | Disposition: A | Discharge status: Home / Self Care | Payer: Managed Care, Other (non HMO) | Source: Ambulatory Visit | Attending: Gynecologic Oncology | Admitting: Gynecologic Oncology

## 2012-07-11 ENCOUNTER — Other Ambulatory Visit: Payer: Managed Care, Other (non HMO) | Admitting: Lab

## 2012-07-11 ENCOUNTER — Ambulatory Visit: Payer: Managed Care, Other (non HMO) | Attending: Gynecologic Oncology | Admitting: Gynecologic Oncology

## 2012-07-11 VITALS — BP 138/74 | HR 96 | Temp 98.3°F | Resp 18 | Ht 62.68 in | Wt 185.8 lb

## 2012-07-11 DIAGNOSIS — Z01419 Encounter for gynecological examination (general) (routine) without abnormal findings: Secondary | ICD-10-CM | POA: Insufficient documentation

## 2012-07-11 DIAGNOSIS — R300 Dysuria: Secondary | ICD-10-CM

## 2012-07-11 DIAGNOSIS — Z1151 Encounter for screening for human papillomavirus (HPV): Secondary | ICD-10-CM | POA: Insufficient documentation

## 2012-07-11 DIAGNOSIS — C541 Malignant neoplasm of endometrium: Secondary | ICD-10-CM

## 2012-07-11 LAB — URINALYSIS, MICROSCOPIC - CHCC
Blood: NEGATIVE
Glucose: NEGATIVE mg/dL
Ketones: NEGATIVE mg/dL
RBC / HPF: NEGATIVE (ref 0–2)
Specific Gravity, Urine: 1.005 (ref 1.003–1.035)

## 2012-07-11 NOTE — Patient Instructions (Signed)
We will contact you with the results of your urine sample.

## 2012-07-11 NOTE — Progress Notes (Signed)
Follow Up Note: Gyn-Onc  Art Buff 67 y.o. female  CC:  Chief Complaint  Patient presents with  . Dysuria, Vaginal Discharge    Follow up    HPI:  Meagan [name] is a 67 year old, gravida 1 para 1, with vaginal bleeding since June 2013. She underwent a D&C with Dr. Phylliss Bob, which revealed a grade 1 endometrial carcinoma. On December 14, 2011, she underwent a total robotic hysterectomy bilateral salpingo-oophorectomy bilateral pelvic lymph node dissection. Operative findings included a globular uterus with a 6 cm anterior myoma in a 3 cm small pedunculated myoma. She had adhesive disease of the rectosigmoid colon to the left adnexa and the pelvic sidewall. Frozen section revealed no evidence of residual grade 1 endometrial adenocarcinoma.  Final pathology revealed: ENDOMETRIUM WITH SMALL FOCUS OF HIGHLY ATYPICAL ENDOMETRIAL GLANDS IN THE VICINITY OF PREVIOUS BIOPSY SITE / CURETTAGE CHANGES CONSISTENT WITH FOCAL ATYPICAL COMPLEX HYPERPLASIA, SEE COMMENT. - DEFINITIVE INVASIVE TUMOR OR TUMOR INVOLVING A LYMPH/VASCULAR SPACE IS NOT IDENTIFIED. - ADENOMYOSIS PRESENT. - BENIGN LEIOMYOMATA. - BENIGN CERVIX WITHOUT DYSPLASIA, ATYPIA OR MALIGNANCY IDENTIFIED. - BILATERAL OVARIES SHOW NO SIGNIFICANT PATHOLOGIC ABNORMALITIES. - BILATERAL FALLOPIAN TUBES SHOW NO SIGNIFICANT PATHOLOGIC ABNORMALITIES. 2. Lymph nodes, regional resection, left pelvic - SIX BENIGN LYMPH NODES WITH NO TUMOR SEEN (0/6). 3. Lymph nodes, regional resection, right pelvic - THREE BENIGN LYMPH NODES WITH NO TUMOR SEEN (0/3).  Her post-operative course was uneventful.  She was treated for a urinary tract infection in December 2013 and in April 2014.  Interval History:  She presents today with complaints of vaginal itching with "feeling like my urinary infection is back."  She denies fever, chills, hematuria, frequency, urgency, vaginal bleeding, or rectal  bleeding.  She reports irritation around the introitus with minimal discharge.  She reports moderate pain and burning with intercourse even with the use of lubrication.  Adequate fluid intake reported.  Proper urinary habits discussed.  She reports her fasting blood glucose this am in the 200s.  She reports that she "feels something is not right" even though she had a recent checkup with Dr. Duard Brady on June 12.  Reporting severe episode of gastric reflux this am.  No other concerns voiced.      Review of Systems  Constitutional: Feels well.  Cardiovascular: No chest pain, shortness of breath, or edema.  Pulmonary: No cough or wheeze.  Gastrointestinal: No nausea, vomiting, or diarrhea. No bright red blood per rectum or change in bowel movement.  Genitourinary: Moderate vaginal/vulvar irritation.  Mild burning with urination.  No frequency or urgency. No vaginal bleeding or discharge.  Musculoskeletal: No myalgia or joint pain. Neurologic: No weakness, numbness, or change in gait.  Psychology: No depression, anxiety, or insomnia.  Current Meds:  Outpatient Encounter Prescriptions as of 07/11/2012  Medication Sig Dispense Refill  . doxazosin (CARDURA) 2 MG tablet Take 2 mg by mouth at bedtime.      . fluconazole (DIFLUCAN) 100 MG tablet Take 1 tablet (100 mg total) by mouth daily.  2 tablet  0  . metFORMIN (GLUCOPHAGE) 500 MG tablet Take 1,000 mg by mouth every evening. Takes 1000 mg in the a.m. And 1000 mg in the pm      . oxyCODONE-acetaminophen (PERCOCET/ROXICET) 5-325 MG per tablet Take 1-2 tablets by mouth every 4 (four) hours as needed (for moderate to severe pain).  45 tablet  0  . potassium chloride (K-DUR) 10 MEQ tablet Take 1 tablet (10 mEq total) by mouth 2 (two) times daily.  6 tablet  0  . ranitidine (ZANTAC) 300 MG tablet Take 300 mg by mouth 2 (two) times daily.      Marland Kitchen triamterene-hydrochlorothiazide (MAXZIDE-25) 37.5-25 MG per tablet Take 1 tablet by mouth at bedtime.        No  facility-administered encounter medications on file as of 07/11/2012.    Allergy:  Allergies  Allergen Reactions  . Penicillins Swelling and Other (See Comments)    hallucinations  . Shrimp (Shellfish Allergy) Nausea And Vomiting  . Sinutab Sinus Max St (Phenylephrine-Acetaminophen) Swelling  . Valacyclovir Other (See Comments)    Confusion    Social Hx:   History   Social History  . Marital Status: Married    Spouse Name: N/A    Number of Children: N/A  . Years of Education: N/A   Occupational History  . Not on file.   Social History Main Topics  . Smoking status: Never Smoker   . Smokeless tobacco: Never Used  . Alcohol Use: No  . Drug Use: No  . Sexually Active: Yes    Birth Control/ Protection: None     Comment: husband had a vasectomy   Other Topics Concern  . Not on file   Social History Narrative  . No narrative on file    Past Surgical Hx:  Past Surgical History  Procedure Laterality Date  . Dilation and curettage of uterus    . Wisdom tooth extraction    . Cholecystectomy    . Dilatation & currettage/hysteroscopy with resectocope  11/30/2011    Procedure: DILATATION & CURETTAGE/HYSTEROSCOPY WITH RESECTOCOPE;  Surgeon: Alison Murray, MD;  Location: [facility];  Service: Gynecology;  Laterality: N/A;  . Robotic assisted total hysterectomy with bilateral salpingo oopherectomy  12/14/2011    Procedure: ROBOTIC ASSISTED TOTAL HYSTERECTOMY WITH BILATERAL SALPINGO OOPHORECTOMY;  Surgeon: Rejeana Brock A. Duard Brady, MD;  Location: [facility];  Service: Gynecology;  Laterality: N/A;  . Lymph node dissection  12/14/2011    Procedure: LYMPH NODE DISSECTION;  Surgeon: Rejeana Brock A. Duard Brady, MD;  Location: [facility];  Service: Gynecology;  Laterality: N/A;    Past Medical Hx:  Past Medical History  Diagnosis Date  . SVD (spontaneous vaginal delivery)     x 1  . UTI (lower urinary tract infection) 11/22/11    started abx on 11/24/11  . Hypertension   . Anxiety     no meds  . GERD  (gastroesophageal reflux disease)     tx zantac  . H/O hiatal hernia   . Arthritis     knees  . Anemia     hx   . Diabetes mellitus without complication     on oral medication  . Cancer     endometrial cancer    Family Hx:  Family History  Problem Relation Age of Onset  . Skin cancer Sister   . Cancer Paternal Aunt     Vitals:  Blood pressure 138/74, pulse 96, temperature 98.3 F (36.8 C), temperature source Oral, resp. rate 18, height 5' 2.68" (1.592 m), weight 185 lb 12.8 oz (84.278 kg).  Physical Exam:  General: Well developed, well nourished female in no acute distress. Alert and oriented x 3.  Neck: Supple without any enlargements.  Lymph node survey: No cervical, supraclavicular, or inguinal adenopathy  Cardiovascular: Regular rate and rhythm. S1 and S2 normal.  Lungs: Clear to auscultation bilaterally. No wheezes/crackles/rhonchi noted.  Skin: No rashes or lesions present. Back: No CVA tenderness.  Abdomen: Abdomen soft, non-tender and obese.  Active bowel sounds in all quadrants. No evidence of a fluid wave or abdominal masses.  Genitourinary:    Vulva/vagina: Normal external female genitalia. No lesions.    Urethra: No lesions or masses.    Vagina: Moderate erythema present around the introitus.  Vagina atrophic without any lesions.  No vaginal bleeding noted.  Thin, white discharge noted.  Pap smear obtained. Extremities: No bilateral cyanosis, edema, or clubbing.   Assessment/Plan:  67 year old with a Stage IA grade 1 endometrial carcinoma.  We will obtain a urine sample today for analysis and culture.  More than half of the visit was spent counseling the patient on proper urinary habits and improving her glycemic control.  She is to follow up as previously advised by Dr. Duard Brady and Dr. Tresa Res.  She is advised to call for any questions or concerns and if symptoms persist or worsen.  Reportable signs and symptoms reviewed.     CROSS, [name] DEAL, NP 07/11/2012,  12:11 PM

## 2012-07-12 LAB — URINE CULTURE

## 2012-07-14 NOTE — Progress Notes (Signed)
T/C to pt to schedule 6 month f/u appt  Per Dr. Duard Brady. I also talked to pt about Dr. Tresa Res retiring in August And to see if it would be ok to schedule her f/u appt with Dr. Hyacinth Meeker. Pt was happy,  but sad to here that Dr. Tresa Res  Was retiring. Pt said yes she would love to see Dr. Hyacinth Meeker.  38month f/u appt scheduled with Dr. Hyacinth Meeker 12/29/12 And a recall was put in .

## 2012-07-17 ENCOUNTER — Telehealth: Payer: Self-pay | Admitting: Obstetrics and Gynecology

## 2012-07-17 ENCOUNTER — Telehealth: Payer: Self-pay | Admitting: Gynecologic Oncology

## 2012-07-17 NOTE — Telephone Encounter (Signed)
Patient informed of urine culture results.  Reporting improvement in burning, itching, and vulvar irritation.  Instructed that she would be contacted with her pap smear results.  No concerns voiced.

## 2012-07-17 NOTE — Telephone Encounter (Signed)
Call to [facility]. LM that we do not have any Pap results on this pt. Pt new to [place] 11-2011. Pt's last Pap 06-2011 at another unknown office. No results on file or in Epic. [name] to call back with any questions.

## 2012-07-17 NOTE — Telephone Encounter (Signed)
Oncology office calling wanting to speak with a nurse about this patient and review verbally pap history.

## 2012-07-19 ENCOUNTER — Telehealth: Payer: Self-pay | Admitting: Gynecologic Oncology

## 2012-07-19 NOTE — Telephone Encounter (Signed)
Informed of pap smear results: ASGUS HPV high risk not detected.  Informed of Dr. Denman George recommendation for continued follow up with Dr. Tresa Res in 6 months and GYN Onc in one year.  Reportable signs and symptoms reviewed.  Instructed to call for any needs or concerns.

## 2012-12-27 ENCOUNTER — Other Ambulatory Visit: Payer: Self-pay | Admitting: Otolaryngology

## 2012-12-27 DIAGNOSIS — R221 Localized swelling, mass and lump, neck: Secondary | ICD-10-CM

## 2012-12-27 DIAGNOSIS — R591 Generalized enlarged lymph nodes: Secondary | ICD-10-CM

## 2012-12-28 ENCOUNTER — Encounter: Payer: Self-pay | Admitting: Obstetrics & Gynecology

## 2012-12-29 ENCOUNTER — Ambulatory Visit (INDEPENDENT_AMBULATORY_CARE_PROVIDER_SITE_OTHER): Payer: Medicare HMO | Admitting: Obstetrics & Gynecology

## 2012-12-29 ENCOUNTER — Encounter: Payer: Self-pay | Admitting: Obstetrics & Gynecology

## 2012-12-29 VITALS — BP 142/72 | HR 80 | Resp 20 | Ht 62.5 in | Wt 186.2 lb

## 2012-12-29 DIAGNOSIS — C549 Malignant neoplasm of corpus uteri, unspecified: Secondary | ICD-10-CM

## 2012-12-29 DIAGNOSIS — Z87440 Personal history of urinary (tract) infections: Secondary | ICD-10-CM

## 2012-12-29 DIAGNOSIS — C541 Malignant neoplasm of endometrium: Secondary | ICD-10-CM

## 2012-12-29 NOTE — Progress Notes (Signed)
67 y.o. G1P1 MarriedCaucasian  F here for repeat Pap smear due to history of complex atypical adenocarcinoma on final pathology.  Had endometrial biopsy that showed endometrial cancer.  Patient is having vaginal dryness with painful intercourse.  Pain is primarily with actual intercourse, not insertion.  Pt feels pain almost always and often has to stop.  Was told by Warner Mccreedy that she couldn't use any estrogen products.    ROS:  All other ROS questions are negative  Patient's last menstrual period was 01/18/2001.Meagan [name]  EXAM: BP 142/72  Pulse 80  Resp 20  Ht 5' 2.5" (1.588 m)  Wt 186 lb 3.2 oz (84.46 kg)  BMI 33.49 kg/m2  LMP 01/18/2001 General appearance:  WNWD Female, NAD Lymph:  No clavicular, neck LAD Lungs:  CTA bilaterally CV:  RRR without M/R/G Pelvic exam: normal external genitalia, vulva, vagina, cervix, uterus and adnexa, VULVA: normal appearing vulva with no masses, tenderness or lesions, VAGINA: atrophic appearing vagina with normal color and discharge, no lesions, , CERVIX: surgically absent, UTERUS:  Surgically absent, ADNEXA: normal adnexa in size, nontender and no masses, no masses. Pap smear obtained.  Assessment: History of endometrial adenoca on endometrial biopsy and final pathology with complex endometrial hyperplasia Dyspareunia  Plan: Repeat Pap 6 months.  She will follow up with Dr. Duard Brady at that time. F/U here one year Will check with gyn/onc about vaginal estrogen

## 2012-12-29 NOTE — Patient Instructions (Signed)
I will follow up with you after communicating with gyn oncology.

## 2013-01-02 ENCOUNTER — Other Ambulatory Visit: Payer: Managed Care, Other (non HMO)

## 2013-01-02 LAB — IPS PAP SMEAR ONLY

## 2013-01-16 ENCOUNTER — Other Ambulatory Visit: Payer: Self-pay | Admitting: Otolaryngology

## 2013-01-16 ENCOUNTER — Other Ambulatory Visit: Payer: Managed Care, Other (non HMO)

## 2013-01-16 DIAGNOSIS — R599 Enlarged lymph nodes, unspecified: Secondary | ICD-10-CM

## 2013-01-16 DIAGNOSIS — R220 Localized swelling, mass and lump, head: Secondary | ICD-10-CM

## 2013-01-23 ENCOUNTER — Ambulatory Visit
Admission: RE | Admit: 2013-01-23 | Discharge: 2013-01-23 | Disposition: A | Discharge status: Home / Self Care | Payer: Medicare HMO | Source: Ambulatory Visit | Attending: Otolaryngology | Admitting: Otolaryngology

## 2013-01-23 DIAGNOSIS — R220 Localized swelling, mass and lump, head: Secondary | ICD-10-CM

## 2013-01-23 DIAGNOSIS — R221 Localized swelling, mass and lump, neck: Secondary | ICD-10-CM

## 2013-01-23 DIAGNOSIS — R599 Enlarged lymph nodes, unspecified: Secondary | ICD-10-CM

## 2013-01-23 IMAGING — US US SOFT TISSUE HEAD/NECK
1 series · 14 of 25 positions shown · non-contrast
Comparison: None.

CLINICAL DATA: Two palpable areas in the neck.

EXAM:
ULTRASOUND OF HEAD/NECK SOFT TISSUES
TECHNIQUE: Ultrasound examination of the head and neck soft tissues was
performed in the area of clinical concern.

[Series 1: us soft tissue head/neck · 0.09mm/px · 14 of 37 slices shown]
[im 1/37]
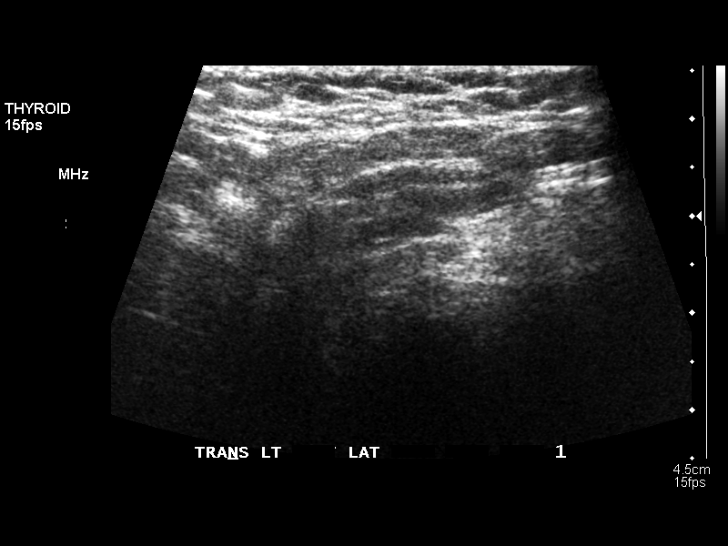
[im 4/37]
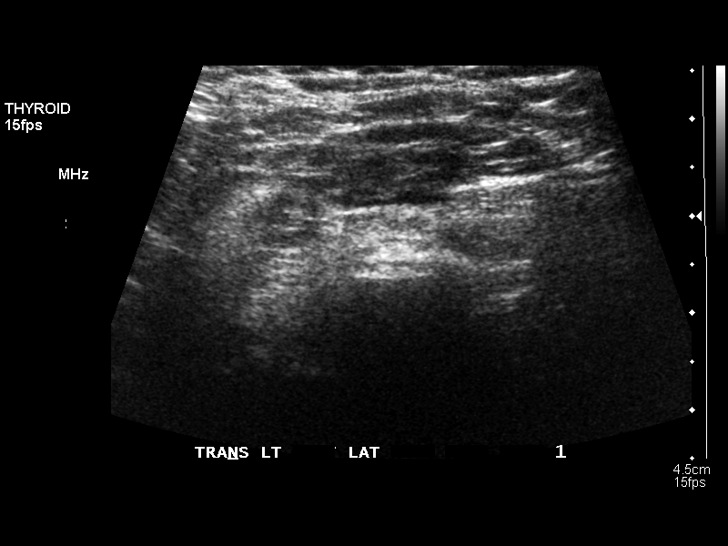
[im 7/37]
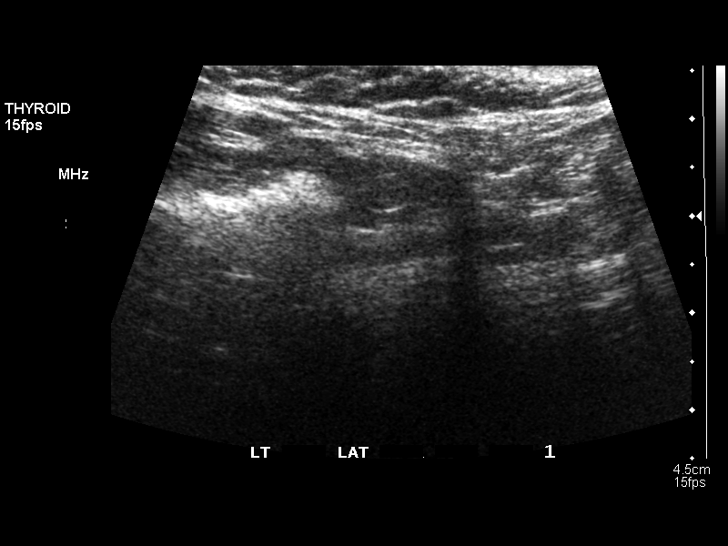
[im 10/37]
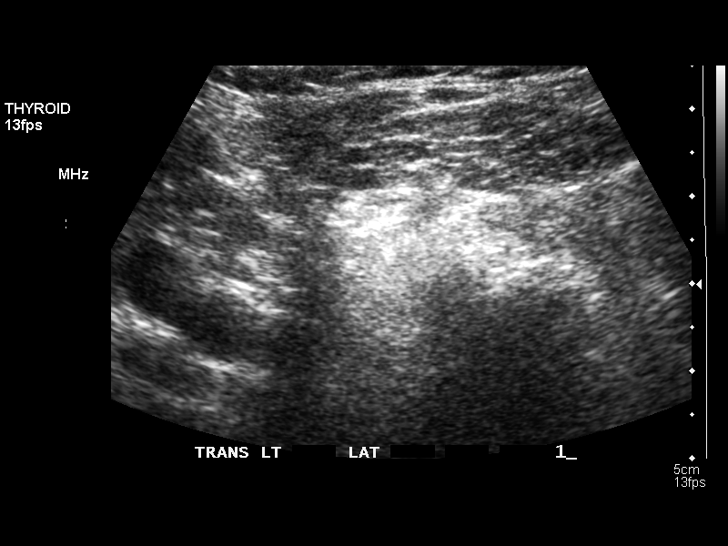
[im 13/37]
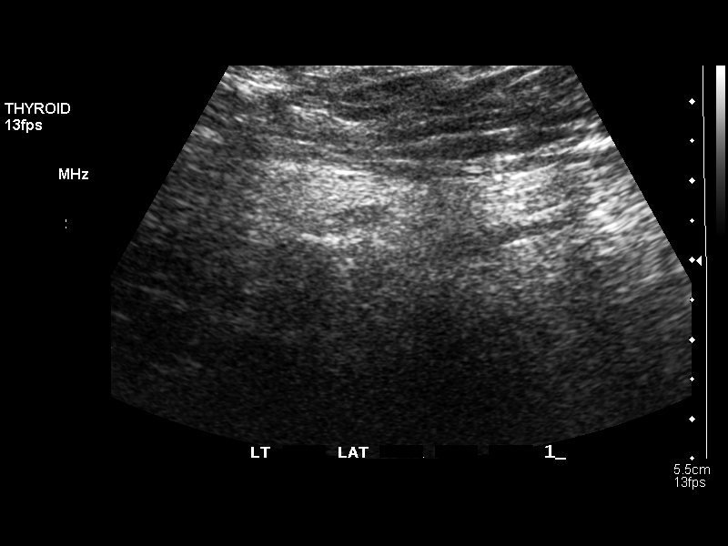
[im 14/37]
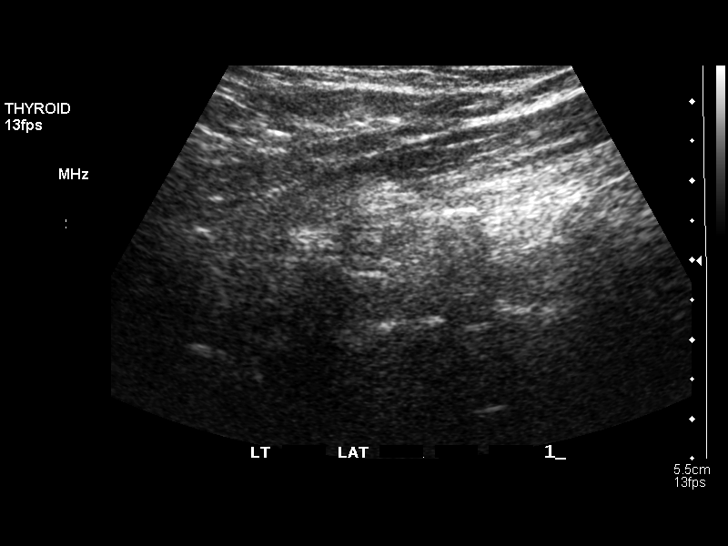
[im 17/37]
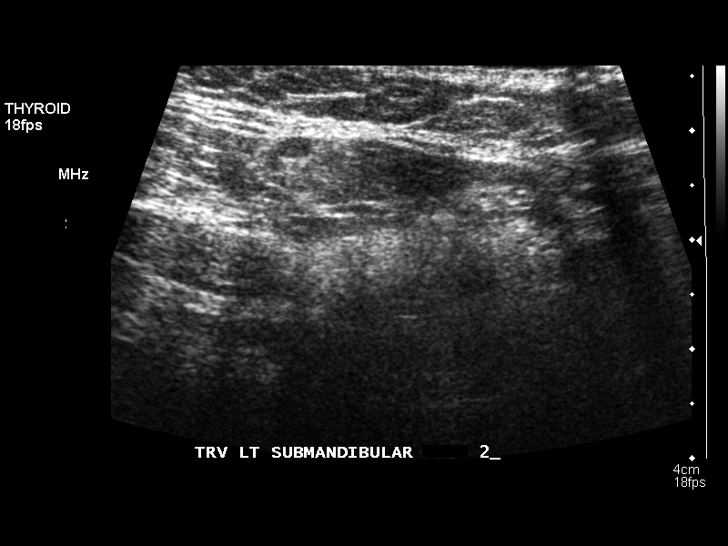
[im 20/37]
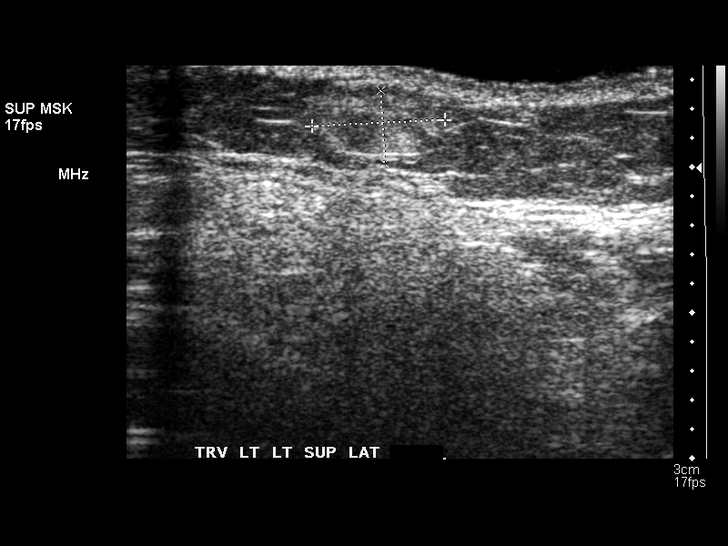
[im 23/37]
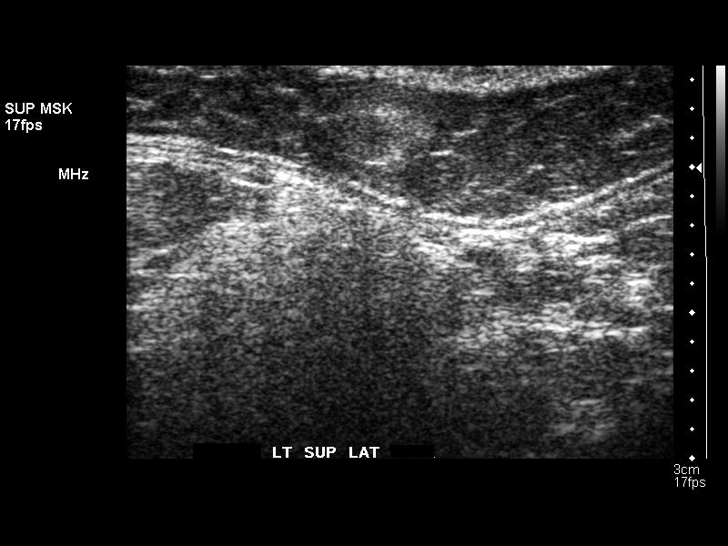
[im 25/37]
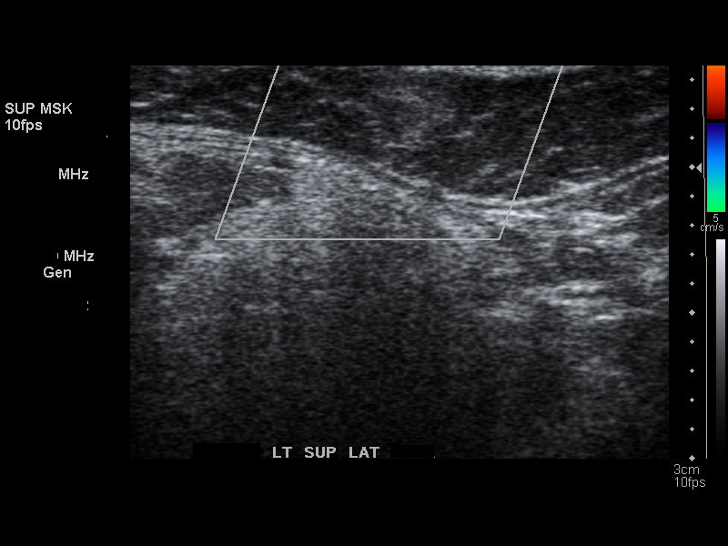
[im 28/37]
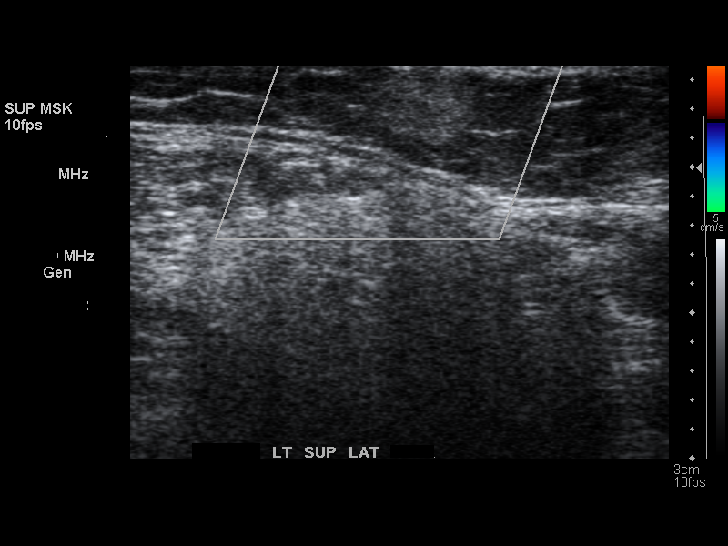
[im 31/37]
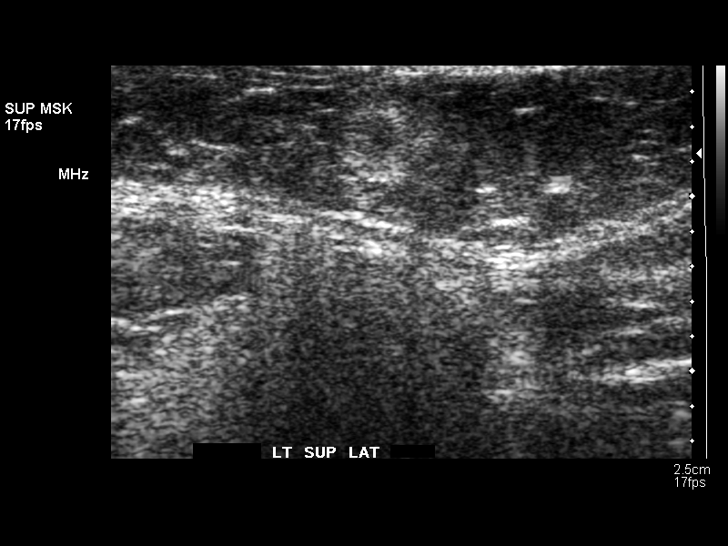
[im 34/37]
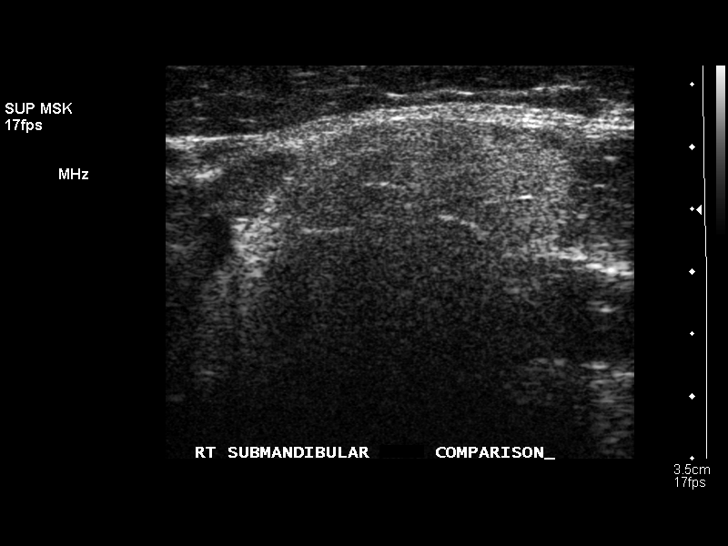
[im 37/37]
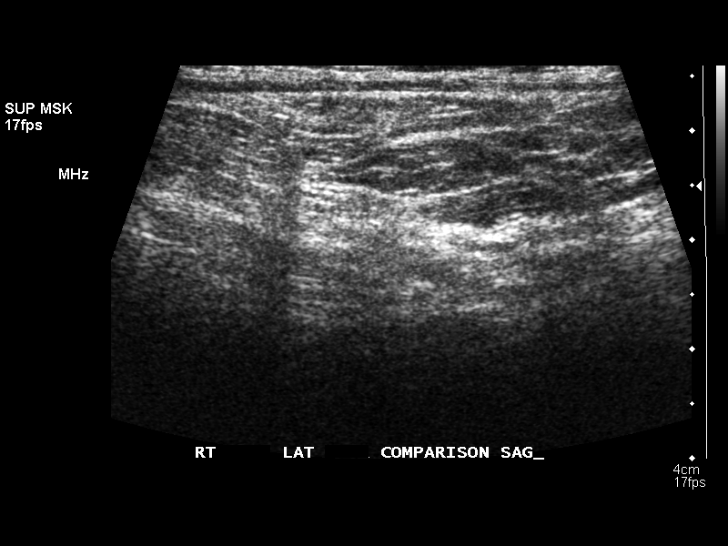

[14 of 25 positions shown; findings below may reference images not displayed]

FINDINGS: Palpable and painful area in the left posterior neck was evaluated
by ultrasound. No cystic or solid mass is seen in this area.

Small palpable area in the left submandibular region was evaluated
by ultrasound. This corresponds to an area of increased echogenicity
with poorly defined margins in the subcutaneous fat. This area
measures 9 x 5 mm and is superficial to the submandibular gland. The
submandibular gland appears normal. No significant blood flow seen
within the echogenic area.
IMPRESSION: Palpable area left submandibular region corresponds to an area of
increased echogenicity in the subcutaneous fat without blood flow.
Question area of scarring from prior infection. This does not appear
to represent a lymph node or mass lesion and has a benign
appearance.

No ultrasound abnormality is seen in the left posterior neck.

## 2013-01-29 MED ORDER — ESTRADIOL 0.1 MG/GM VA CREA: TOPICAL_CREAM | VAGINAL | Status: DC

## 2013-01-29 NOTE — Addendum Note (Signed)
Addended by: Megan Salon on: 01/29/2013 09:45 AM   Modules accepted: Orders

## 2013-01-30 NOTE — Progress Notes (Signed)
Patient notified. She states rx is too expensive and can't afford to get it even if it last for 6 months.

## 2013-04-11 ENCOUNTER — Telehealth: Payer: Self-pay | Admitting: Gynecologic Oncology

## 2013-04-11 NOTE — Telephone Encounter (Signed)
Patient called stating she thinks she has another urinary tract infection.  Same symptoms reported including dysuria and frequency.  Stating she can only go her to GYN once a year per her insurance and she has a scheduled appt with Dr. Alycia Rossetti in June.  Advised to contact her primary care provider to be evaluated.  Verbalizing understanding.  Advised to call for any questions or concerns.

## 2013-05-08 ENCOUNTER — Telehealth: Payer: Self-pay | Admitting: *Deleted

## 2013-05-08 NOTE — Telephone Encounter (Signed)
Called pt to notify her of her future appointment with Dr. Alycia Rossetti on July 05, 2013 @ 3:45. Pt agreed with time and date

## 2013-05-28 ENCOUNTER — Telehealth: Payer: Self-pay | Admitting: *Deleted

## 2013-05-28 NOTE — Telephone Encounter (Signed)
Note continued. Discussed with pt we have scheduled her appt for June 18.

## 2013-05-28 NOTE — Telephone Encounter (Signed)
pt called to confirm appt, asked if her PCP had referred her as her insurance will not cover her appt otherwise. Discussed with

## 2013-07-05 ENCOUNTER — Ambulatory Visit: Payer: Medicare HMO | Attending: Gynecologic Oncology | Admitting: Gynecologic Oncology

## 2013-07-05 ENCOUNTER — Encounter: Payer: Self-pay | Admitting: Gynecologic Oncology

## 2013-07-05 VITALS — BP 143/74 | HR 93 | Temp 98.5°F | Ht 62.68 in | Wt 188.7 lb

## 2013-07-05 DIAGNOSIS — I10 Essential (primary) hypertension: Secondary | ICD-10-CM | POA: Insufficient documentation

## 2013-07-05 DIAGNOSIS — E119 Type 2 diabetes mellitus without complications: Secondary | ICD-10-CM | POA: Insufficient documentation

## 2013-07-05 DIAGNOSIS — Z79899 Other long term (current) drug therapy: Secondary | ICD-10-CM | POA: Insufficient documentation

## 2013-07-05 DIAGNOSIS — IMO0002 Reserved for concepts with insufficient information to code with codable children: Secondary | ICD-10-CM

## 2013-07-05 DIAGNOSIS — C541 Malignant neoplasm of endometrium: Secondary | ICD-10-CM

## 2013-07-05 DIAGNOSIS — C549 Malignant neoplasm of corpus uteri, unspecified: Secondary | ICD-10-CM | POA: Insufficient documentation

## 2013-07-05 DIAGNOSIS — F411 Generalized anxiety disorder: Secondary | ICD-10-CM | POA: Insufficient documentation

## 2013-07-05 DIAGNOSIS — Z8542 Personal history of malignant neoplasm of other parts of uterus: Secondary | ICD-10-CM

## 2013-07-05 DIAGNOSIS — Z90710 Acquired absence of both cervix and uterus: Secondary | ICD-10-CM

## 2013-07-05 DIAGNOSIS — K219 Gastro-esophageal reflux disease without esophagitis: Secondary | ICD-10-CM | POA: Insufficient documentation

## 2013-07-05 MED ORDER — ESTROGENS, CONJUGATED 0.625 MG/GM VA CREA: TOPICAL_CREAM | Freq: Every day | VAGINAL | Status: DC

## 2013-07-05 NOTE — Patient Instructions (Addendum)
Followup with Dr. Sabra Heck in 6 months return to see GYN oncology in one year.  Colonoscopy A colonoscopy is an exam to look at the entire large intestine (colon). This exam can help find problems such as tumors, polyps, inflammation, and areas of bleeding. The exam takes about 1 hour.  LET [facility] CARE PROVIDER KNOW ABOUT:   Any allergies you have.  All medicines you are taking, including vitamins, herbs, eye drops, creams, and over-the-counter medicines.  Previous problems you or members of your family have had with the use of anesthetics.  Any blood disorders you have.  Previous surgeries you have had.  Medical conditions you have. RISKS AND COMPLICATIONS  Generally, this is a safe procedure. However, as with any procedure, complications can occur. Possible complications include:  Bleeding.  Tearing or rupture of the colon wall.  Reaction to medicines given during the exam.  Infection (rare). BEFORE THE PROCEDURE   Ask your health care provider about changing or stopping your regular medicines.  You may be prescribed an oral bowel prep. This involves drinking a large amount of medicated liquid, starting the day before your procedure. The liquid will cause you to have multiple loose stools until your stool is almost clear or light green. This cleans out your colon in preparation for the procedure.  Do not eat or drink anything else once you have started the bowel prep, unless your health care provider tells you it is safe to do so.  Arrange for someone to drive you home after the procedure. PROCEDURE   You will be given medicine to help you relax (sedative).  You will lie on your side with your knees bent.  A long, flexible tube with a light and camera on the end (colonoscope) will be inserted through the rectum and into the colon. The camera sends video back to a computer screen as it moves through the colon. The colonoscope also releases carbon dioxide gas to inflate  the colon. This helps your health care provider see the area better.  During the exam, your health care provider may take a small tissue sample (biopsy) to be examined under a microscope if any abnormalities are found.  The exam is finished when the entire colon has been viewed. AFTER THE PROCEDURE   Do not drive for 24 hours after the exam.  You may have a small amount of blood in your stool.  You may pass moderate amounts of gas and have mild abdominal cramping or bloating. This is caused by the gas used to inflate your colon during the exam.  Ask when your test results will be ready and how you will get your results. Make sure you get your test results. Document Released: 01/02/2000 Document Revised: 10/25/2012 Document Reviewed: 09/11/2012 [facility] Patient Information 2015 [place], [place]. This information is not intended to replace advice given to you by your health care provider. Make sure you discuss any questions you have with your health care provider.

## 2013-07-05 NOTE — Progress Notes (Signed)
Consult Note: Gyn-Onc  Meagan [name] 69 y.o. female  CC:  Chief Complaint  Patient presents with  . Endometrial cancer    Follow up    HPI: Patient is a 68 year old gravida 1 para 1 with vaginal bleeding since June 2013. She underwent a D&C with [name] it revealed a grade 1 endometrial carcinoma. On November 26 of year 2013 she underwent a total robotic hysterectomy bilateral salpingo-oophorectomy bilateral pelvic lymph node dissection. Operative findings included a globular uterus with a 6 cm anterior myoma in a 3 cm small pedunculated myoma. She adhesive disease of the rectosigmoid colon to the left adnexa and the pelvic sidewall. Frozen section revealed no evidence of residual grade 1 endometrial adenocarcinoma.  Final pathology revealed: ENDOMETRIUM WITH SMALL FOCUS OF HIGHLY ATYPICAL ENDOMETRIAL GLANDS IN THE VICINITY OF PREVIOUS BIOPSY SITE / CURETTAGE CHANGES CONSISTENT WITH FOCAL ATYPICAL COMPLEX HYPERPLASIA, SEE COMMENT. - DEFINITIVE INVASIVE TUMOR OR TUMOR INVOLVING A LYMPH/VASCULAR SPACE IS NOT IDENTIFIED. - ADENOMYOSIS PRESENT. - BENIGN LEIOMYOMATA. - BENIGN CERVIX WITHOUT DYSPLASIA, ATYPIA OR MALIGNANCY IDENTIFIED. - BILATERAL OVARIES SHOW NO SIGNIFICANT PATHOLOGIC ABNORMALITIES. - BILATERAL FALLOPIAN TUBES SHOW NO SIGNIFICANT PATHOLOGIC ABNORMALITIES. 2. Lymph nodes, regional resection, left pelvic - SIX BENIGN LYMPH NODES WITH NO TUMOR SEEN (0/6). 3. Lymph nodes, regional resection, right pelvic - THREE BENIGN LYMPH NODES WITH NO TUMOR SEEN (0/3). Microscopic Comment 1. Deeper sections are cut and examined in the area where the foci of highly atypical glandular cells arepresent. Although definitive carcinoma is not present, the area of focal atypical complex hyperplasiaadjacent to previous biopsy site/curettage changes likely represents the area where the previous carcinomawas removed. Of note, all lymph nodes which are examined are negative for metastatic  carcinoma (seespecimen diagnosis for 2 and 3). The previous case ([id number]) is examined with the current case.Based on the previous case, the tumor is best staged as a pT1a, pN0, MX of FIGO stage IA tumor. BothDr. Nicoletta Dress and [name] have seen this case (in conjunction with the previous case) in consultation with agreement that the small focus of atypical endometrial glands (focal atypical complex hyperplasia) likely represents the area where the curettage removed the previously diagnosed tumor.  Interval History:  I last saw her in June of 2014 at which time her exam was negative. However, her Pap smear revealed atypical squamous cells of undetermined significance. She'll followup with Dr. Sabra Heck in December 2014. Exam was negative. A Pap smear at that time was normal. She comes in today for her surveillance exam. She is overall doing quite well. She continues to complain of dyspareunia. She is using lubricants. She did not get the Premarin cream secondary to fear of causing cancer as well as the expense. She is having some loose stools which she feels is secondary to her metformin. Accu-Chek last night was 199. She's not know when her last A1c was. There are no new medical problems and her family. She stay she's never had a mammogram. She states that she has never had one and refuses to schedule one. She states she knows of women who have had had biopsies because of false positive mammograms. Similarly she refuses a colonoscopy. We discussed that they are both proven to make early diagnoses of cancers and saved lives. She still is not interested in pursuing either one of these screening methodologies.  Review of Systems  Constitutional: Denies fever. Skin: No rash, sores, jaundice, itching, or dryness.  Cardiovascular: No chest pain, shortness of breath, or edema  Pulmonary: No  cough or wheeze.  Gastro Intestinal:  No nausea, vomiting, constipation, + diarrhea reported. No bright red blood per rectum  or change in bowel movement.  Genitourinary: No frequency, urgency, or dysuria.  Denies vaginal bleeding and discharge.  Musculoskeletal: No myalgia, arthralgia, joint swelling or pain.  Neurologic: No weakness, numbness, or change in gait.  Psychology:  No changes   Current Meds:  Outpatient Encounter Prescriptions as of 07/05/2013  Medication Sig  . clobetasol (TEMOVATE) 0.05 % external solution   . doxazosin (CARDURA) 2 MG tablet Take 2 mg by mouth at bedtime.  . metFORMIN (GLUCOPHAGE) 500 MG tablet Take 1,000 mg by mouth every evening. Takes 1000 mg in the a.m. And 1000 mg in the pm  . ranitidine (ZANTAC) 300 MG tablet Take 300 mg by mouth 2 (two) times daily.  Marland Kitchen triamterene-hydrochlorothiazide (MAXZIDE-25) 37.5-25 MG per tablet Take 1 tablet by mouth at bedtime.   Marland Kitchen estradiol (ESTRACE) 0.1 MG/GM vaginal cream 1 gram vaginally twice weekly as needed for dryness symptoms.    Allergy:  Allergies  Allergen Reactions  . Doxycycline Hyclate   . Penicillins Swelling and Other (See Comments)    hallucinations  . Shrimp [Shellfish Allergy] Nausea And Vomiting  . Sinutab Sinus Max St [Phenylephrine-Acetaminophen] Swelling  . Valacyclovir Other (See Comments)    Confusion    Social Hx:   History   Social History  . Marital Status: Married    Spouse Name: N/A    Number of Children: N/A  . Years of Education: N/A   Occupational History  . Not on file.   Social History Main Topics  . Smoking status: Never Smoker   . Smokeless tobacco: Never Used  . Alcohol Use: No  . Drug Use: No  . Sexual Activity: Yes    Birth Control/ Protection: None     Comment: husband had a vasectomy, pt with hysterectomy   Other Topics Concern  . Not on file   Social History Narrative  . No narrative on file    Past Surgical Hx:  Past Surgical History  Procedure Laterality Date  . Dilation and curettage of uterus    . Wisdom tooth extraction    . Cholecystectomy    . Dilatation &  currettage/hysteroscopy with resectocope  11/30/2011    Procedure: [place];  Surgeon: Peri Maris, MD;  Location: [facility];  Service: Gynecology;  Laterality: N/A;  . Robotic assisted total hysterectomy with bilateral salpingo oopherectomy  12/14/2011    Procedure: ROBOTIC ASSISTED TOTAL HYSTERECTOMY WITH BILATERAL SALPINGO OOPHORECTOMY;  Surgeon: Imagene Gurney A. Alycia Rossetti, MD;  Location: [facility];  Service: Gynecology;  Laterality: N/A;  . Lymph node dissection  12/14/2011    Procedure: LYMPH NODE DISSECTION;  Surgeon: Imagene Gurney A. Alycia Rossetti, MD;  Location: [facility];  Service: Gynecology;  Laterality: N/A;    Past Medical Hx:  Past Medical History  Diagnosis Date  . SVD (spontaneous vaginal delivery)     x 1  . UTI (lower urinary tract infection) 11/22/11    started abx on 11/24/11  . Hypertension   . Anxiety     no meds  . GERD (gastroesophageal reflux disease)     tx zantac  . H/O hiatal hernia   . Arthritis     knees  . Anemia     hx   . Diabetes mellitus without complication     on oral medication  . Cancer     endometrial cancer    Family  Hx:  Family History  Problem Relation Age of Onset  . Skin cancer Sister   . Cancer Paternal Aunt   . Heart disease Mother   . Heart disease Father     Vitals:  Blood pressure 143/74, pulse 93, temperature 98.5 F (36.9 C), temperature source Oral, height 5' 2.68" (1.592 m), weight 188 lb 11.2 oz (85.594 kg), last menstrual period 01/18/2001.  Physical Exam:  Well-nourished well-developed female in no acute distress.  Neck: Supple, no lymphadenopathy no thyromegaly.  Lungs: Clear to auscultation but he.  Cardiovascular: Regular rate and rhythm.  Abdomen well-healed surgical incisions. There is no appreciable incisional hernias. Abdomen is soft and nontender. There are no palpable masses or hepatosplenomegaly. Exam is somewhat limited by habitus.  Groins: No edema  Extremities: Minimal  edema.  Pelvic: Normal external female genitalia. The vaginal cuff is intact with no visible lesions or discharge. Atrophic.  Bimanual examination was no masses or nodularity. Rectal confirms.  Assessment/Plan: 68 year old with a stage IA grade 1 endometrial carcinoma. She has no evidence of recurrent disease. She will see Dr. Sabra Heck in 6 months and return to see [place] in 1 year. We discussed again for screening mammography and colonoscopy. The patient is not interested in pursuing either one of these options. We also discussed vaginal estrogen which might help with her dyspareunia. She is willing to try estradiol if it's more inexpensive. I have sent a prescription to her drug store.  GEHRIG,PAOLA A., MD 07/05/2013, 8:52 AM

## 2013-08-08 ENCOUNTER — Other Ambulatory Visit: Payer: Self-pay | Admitting: Family Medicine

## 2013-08-08 DIAGNOSIS — R1032 Left lower quadrant pain: Secondary | ICD-10-CM

## 2013-08-09 ENCOUNTER — Ambulatory Visit
Admission: RE | Admit: 2013-08-09 | Discharge: 2013-08-09 | Disposition: A | Discharge status: Home / Self Care | Payer: Medicare HMO | Source: Ambulatory Visit | Attending: Family Medicine | Admitting: Family Medicine

## 2013-08-09 DIAGNOSIS — R1032 Left lower quadrant pain: Secondary | ICD-10-CM

## 2013-08-09 IMAGING — US US ABDOMEN COMPLETE
1 series · 14 of 25 positions shown · non-contrast
Comparison: None.

CLINICAL DATA: Left lower quadrant pain. Previous cholecystectomy
and hysterectomy. Endometrial cancer diagnosed 1.5 years ago.

EXAM:
ULTRASOUND ABDOMEN COMPLETE

[Series 1: us abdomen complete · 0.26mm/px · 14 of 68 slices shown]
[im 1/68]
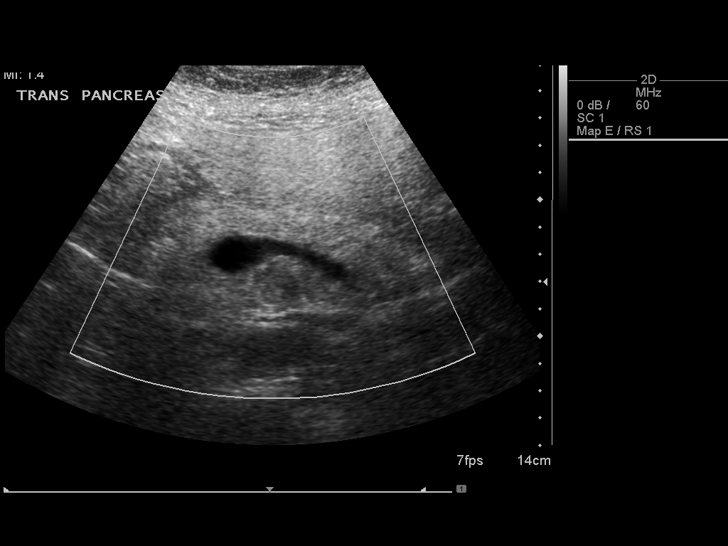
[im 6/68]
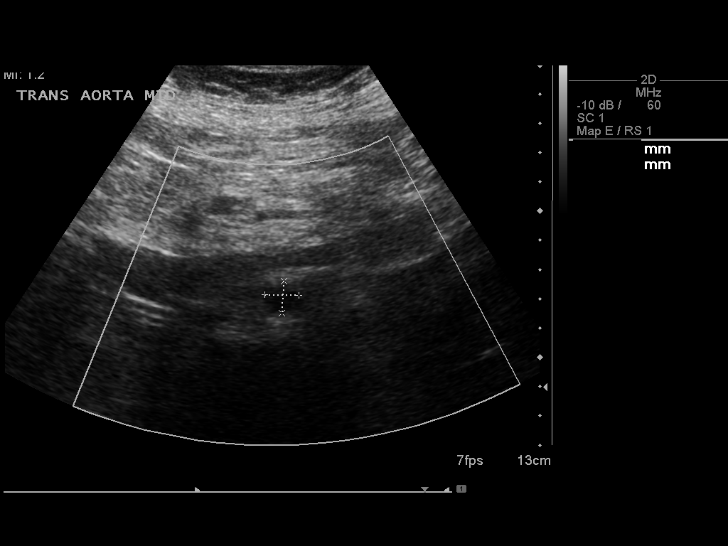
[im 12/68]
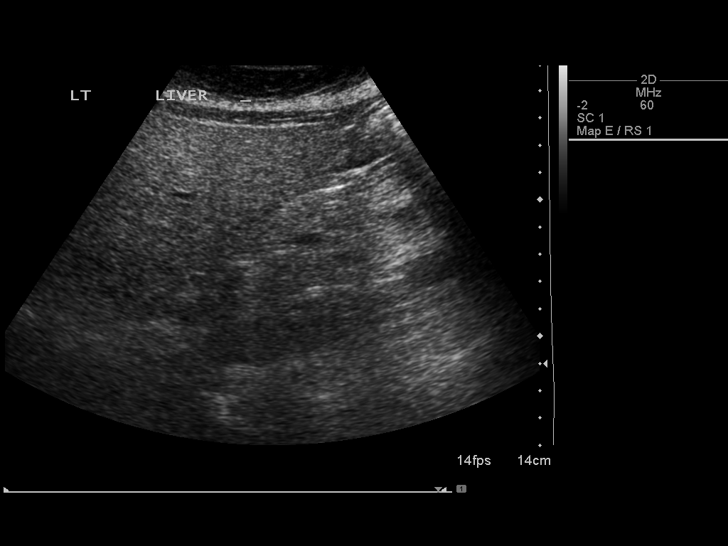
[im 17/68]
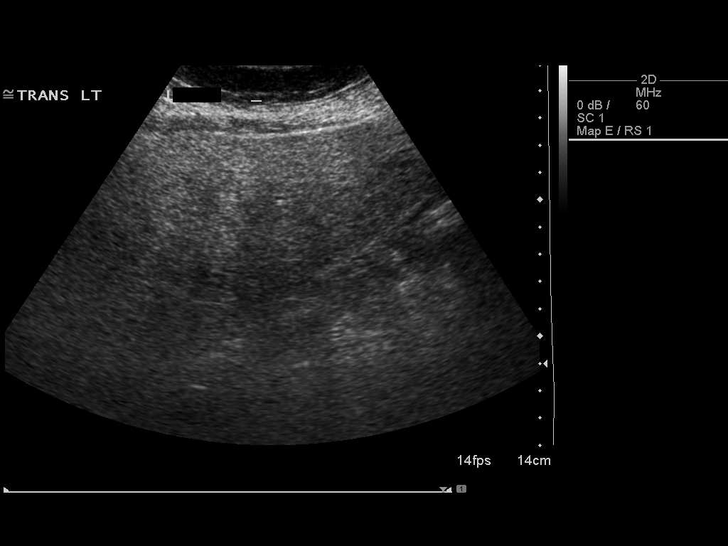
[im 23/68]
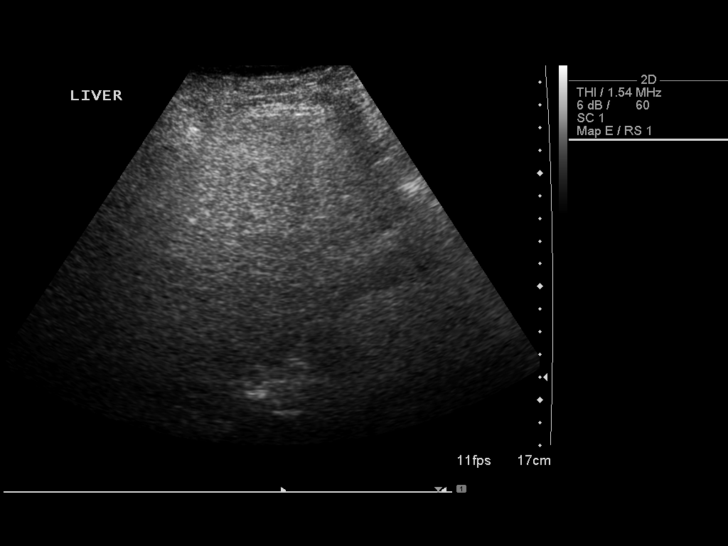
[im 26/68]
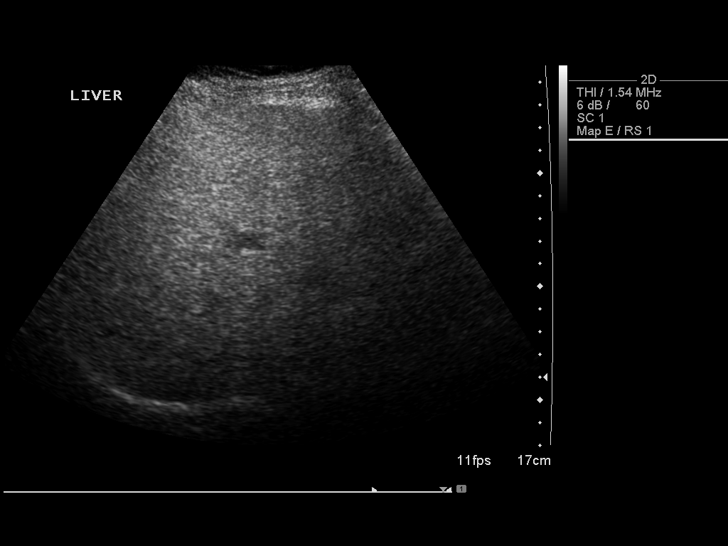
[im 31/68]
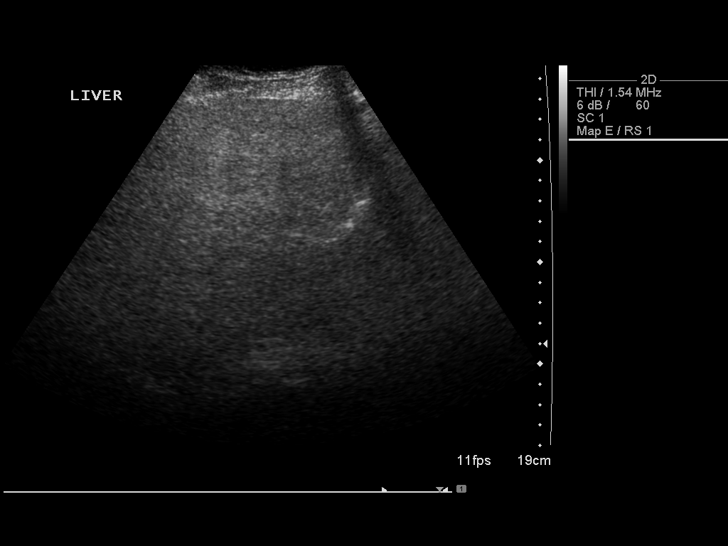
[im 37/68]
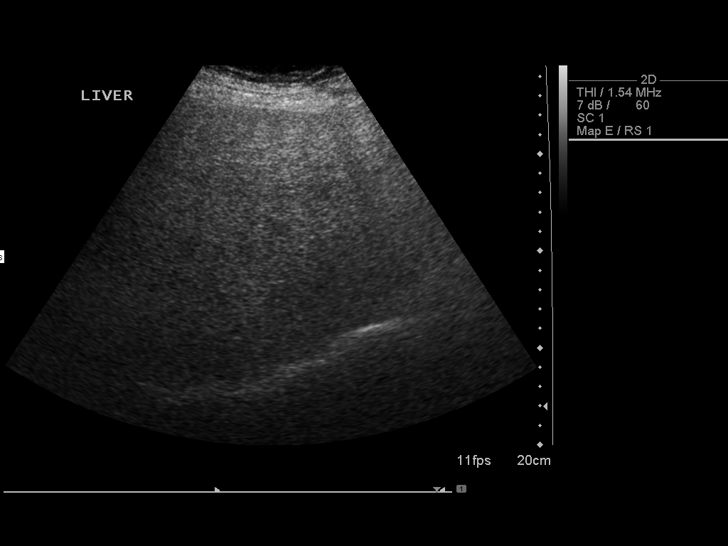
[im 42/68]
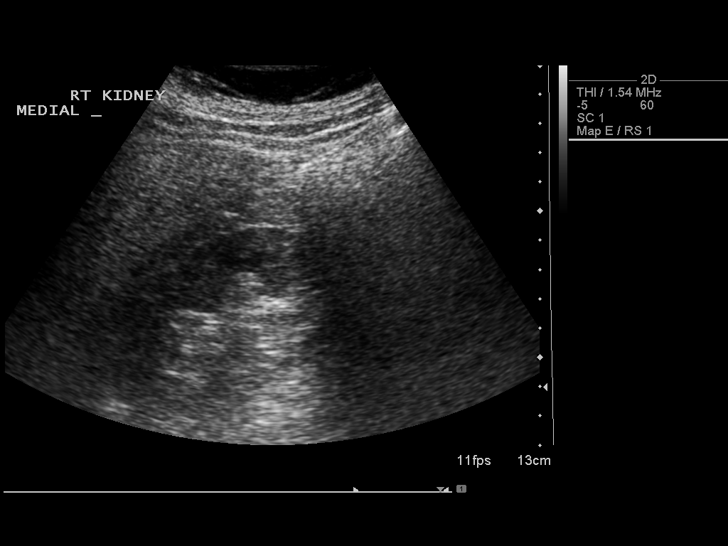
[im 45/68]
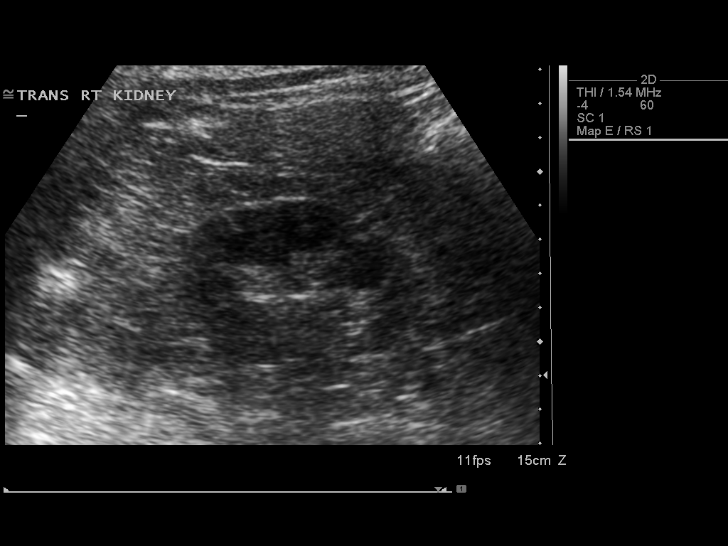
[im 51/68]
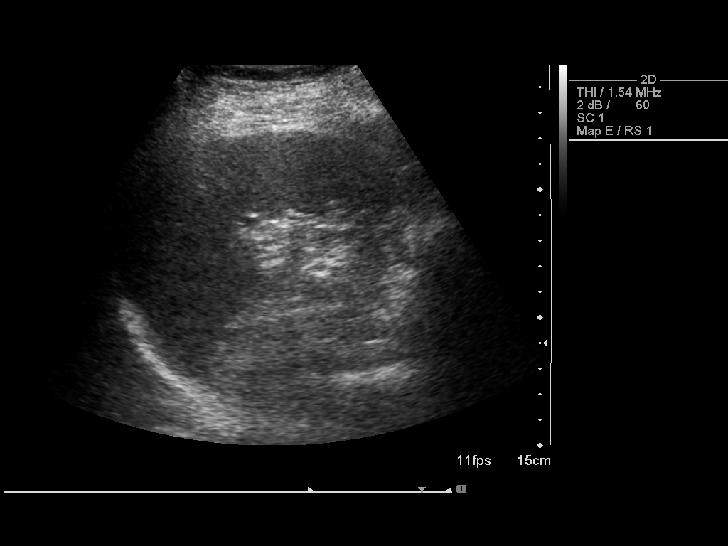
[im 56/68]
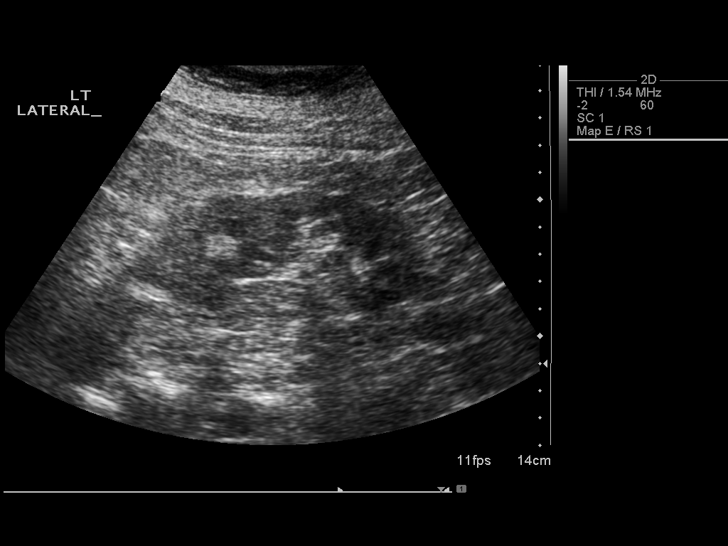
[im 62/68]
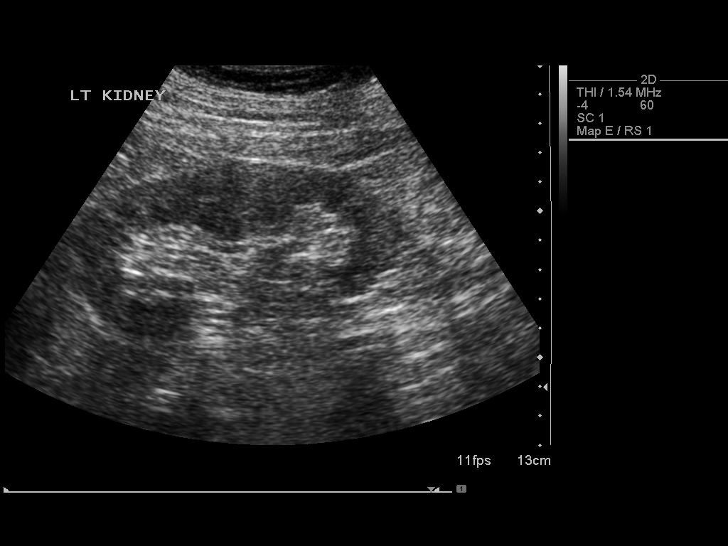
[im 68/68]
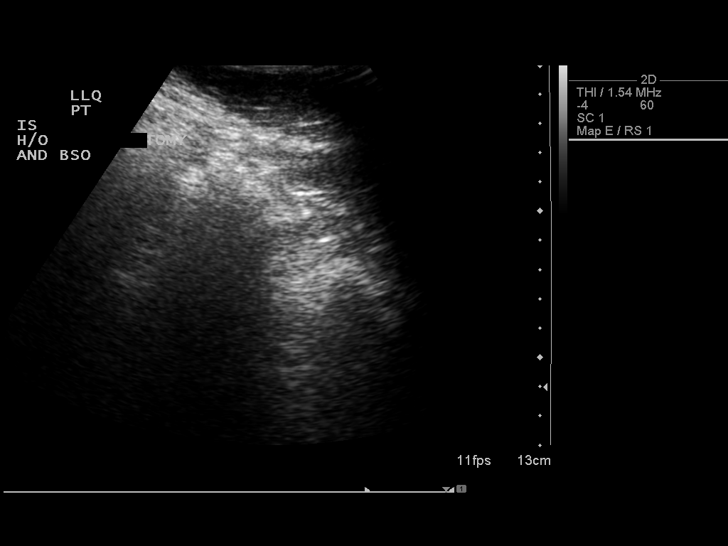

[14 of 25 positions shown; findings below may reference images not displayed]

FINDINGS: Gallbladder:

Previous cholecystectomy.

Common bile duct:

Diameter: 2.9 mm.

Liver:

Mild to moderate diffuse hepatic steatosis without focal mass.

IVC:

No abnormality visualized.

Pancreas:

Visualized portion unremarkable.

Spleen:

Size and appearance within normal limits.

Right Kidney:

Length: 10.4 cm. Echogenicity within normal limits. No mass or
hydronephrosis visualized.

Left Kidney:

Length: 11.7 cm. Echogenicity within normal limits. No mass or
hydronephrosis visualized.

Abdominal aorta:

No aneurysm visualized.

Other findings:

Scanning of the left lower quadrant demonstrates no focal mass or
fluid collection.
IMPRESSION: No acute findings.

Mild to moderate diffuse hepatic steatosis.

## 2013-12-05 ENCOUNTER — Telehealth: Payer: Self-pay | Admitting: Obstetrics & Gynecology

## 2013-12-05 NOTE — Telephone Encounter (Signed)
Spoke with Tito Dine from [place] at [facility]. Advised when patient is seen for aex on 01/04/14 with Dr.Miller ICD 10 code will be Z01.411 per Ivar Drape as patient has a history of endometrial cancer. Tito Dine is agreeable and will call back if she needs anything further.  Routing to provider for final review. Patient agreeable to disposition. Will close encounter

## 2013-12-05 NOTE — Telephone Encounter (Signed)
Patient's primary care office is calling for diagnosis code for this patient's next visit scheduled 01/04/14 with Dr. Sabra Heck for "annual f/u endometrial ca." The patient has Meagan [name] and will they are working on a referral. Please advise?

## 2014-01-04 ENCOUNTER — Encounter: Payer: Self-pay | Admitting: Obstetrics & Gynecology

## 2014-01-04 ENCOUNTER — Ambulatory Visit (INDEPENDENT_AMBULATORY_CARE_PROVIDER_SITE_OTHER): Payer: Medicare HMO | Admitting: Obstetrics & Gynecology

## 2014-01-04 VITALS — BP 138/64 | HR 84 | Resp 16 | Ht 62.5 in | Wt 184.2 lb

## 2014-01-04 DIAGNOSIS — Z124 Encounter for screening for malignant neoplasm of cervix: Secondary | ICD-10-CM

## 2014-01-04 DIAGNOSIS — C541 Malignant neoplasm of endometrium: Secondary | ICD-10-CM

## 2014-01-04 DIAGNOSIS — Z01419 Encounter for gynecological examination (general) (routine) without abnormal findings: Secondary | ICD-10-CM

## 2014-01-04 MED ORDER — NYSTATIN 100000 UNIT/GM EX CREA: 1.0000 "application " | TOPICAL_CREAM | Freq: Two times a day (BID) | CUTANEOUS | Status: DC

## 2014-01-04 MED ORDER — NYSTATIN 100000 UNIT/GM EX POWD: Freq: Three times a day (TID) | CUTANEOUS | Status: DC

## 2014-01-04 NOTE — Progress Notes (Signed)
68 y.o. G1P1 MarriedCaucasianF here for annual exam.  No vaginal bleeding.  Saw Dr. Alycia Rossetti this summer.  No pap done.    Pt declines MMG, colonoscopy, bone density and states if she's known she would have so much vaginal pain/dryness, she wouldn't have had a hysterectomy either.    PCP:  Dr. Kenton Kingfisher  Patient's last menstrual period was 01/18/2001.          Sexually active: Yes.    The current method of family planning is post menopausal status.    Exercising: No.  not regularly Smoker:  no  Health Maintenance: Pap:  12/29/12 WNL History of abnormal Pap:  H/o endometrial cancer MMG:  none Colonoscopy:  none BMD:   none TDaP: Dr Kenton Kingfisher PCP with [facility] Labs: PCP, Hb today: PCP, Urine today: PCP    reports that she has never smoked. She has never used smokeless tobacco. She reports that she does not drink alcohol or use illicit drugs.  Past Medical History  Diagnosis Date  . SVD (spontaneous vaginal delivery)     x 1  . UTI (lower urinary tract infection) 11/22/11    started abx on 11/24/11  . Hypertension   . Anxiety     no meds  . GERD (gastroesophageal reflux disease)     tx zantac  . H/O hiatal hernia   . Arthritis     knees  . Anemia     hx   . Diabetes mellitus without complication     on oral medication  . Cancer     endometrial cancer    Past Surgical History  Procedure Laterality Date  . Dilation and curettage of uterus    . Wisdom tooth extraction    . Cholecystectomy    . Dilatation & currettage/hysteroscopy with resectocope  11/30/2011    Procedure: [place];  Surgeon: Peri Maris, MD;  Location: [facility];  Service: Gynecology;  Laterality: N/A;  . Robotic assisted total hysterectomy with bilateral salpingo oopherectomy  12/14/2011    Procedure: ROBOTIC ASSISTED TOTAL HYSTERECTOMY WITH BILATERAL SALPINGO OOPHORECTOMY;  Surgeon: Imagene Gurney A. Alycia Rossetti, MD;  Location: [facility];  Service: Gynecology;  Laterality:  N/A;  . Lymph node dissection  12/14/2011    Procedure: LYMPH NODE DISSECTION;  Surgeon: Imagene Gurney A. Alycia Rossetti, MD;  Location: [facility];  Service: Gynecology;  Laterality: N/A;    Current Outpatient Prescriptions  Medication Sig Dispense Refill  . clobetasol (TEMOVATE) 0.05 % external solution     . doxazosin (CARDURA) 2 MG tablet Take 2 mg by mouth at bedtime.    . metFORMIN (GLUCOPHAGE) 500 MG tablet Take 1,000 mg by mouth every evening. Takes 1000 mg in the a.m. And 1000 mg in the pm    . ranitidine (ZANTAC) 300 MG tablet Take 300 mg by mouth 2 (two) times daily.    Marland Kitchen triamterene-hydrochlorothiazide (MAXZIDE-25) 37.5-25 MG per tablet Take 1 tablet by mouth at bedtime.      No current facility-administered medications for this visit.    Family History  Problem Relation Age of Onset  . Skin cancer Sister   . Cancer Paternal Aunt   . Heart disease Mother   . Heart disease Father     ROS:  Pertinent items are noted in HPI.  Otherwise, a comprehensive ROS was negative.  Exam:   BP 138/64 mmHg  Pulse 84  Resp 16  Ht 5' 2.5" (1.588 m)  Wt 184 lb 3.2 oz (83.553 kg)  BMI  33.13 kg/m2  LMP 01/18/2001  Weight change: -2#  Height: 5' 2.5" (158.8 cm)  Ht Readings from Last 3 Encounters:  01/04/14 5' 2.5" (1.588 m)  07/05/13 5' 2.68" (1.592 m)  12/29/12 5' 2.5" (1.588 m)    General appearance: alert, cooperative and appears stated age Head: Normocephalic, without obvious abnormality, atraumatic Neck: no adenopathy, supple, symmetrical, trachea midline and thyroid normal to inspection and palpation Lungs: clear to auscultation bilaterally Breasts: normal appearance, no masses or tenderness Heart: regular rate and rhythm Abdomen: soft, non-tender; bowel sounds normal; no masses,  no organomegaly Extremities: extremities normal, atraumatic, no cyanosis or edema Skin: Skin color, texture, turgor normal.  Inner thigh rash c/w yeast. Lymph nodes: Cervical, supraclavicular, and axillary nodes  normal. No abnormal inguinal nodes palpated Neurologic: Grossly normal   Pelvic: External genitalia:  no lesions              Urethra:  normal appearing urethra with no masses, tenderness or lesions              Bartholins and Skenes: normal                 Vagina: normal appearing vagina with normal color and discharge, no lesions              Cervix: absent              Pap taken: Yes.   Bimanual Exam:  Uterus:  uterus absent              Adnexa: no mass, fullness, tenderness               Rectovaginal: Confirms               Anus:  normal sphincter tone, no lesions  Chaperone was present for exam.  A:  Well Woman with normal exam Diabetes, on Metformin Hypertension H/O Stage 1A, grade 1 endometrial carcinoma Vaginal atrophic changes Skin yeast  P:   Mammogram recommended.  Pt declines. Colonoscopy declined as well as BMD.   pap smear obtained today Nystatin cream and powder rx's to pharmacy.  Instructions provided to pt.  return annually or prn

## 2014-01-07 ENCOUNTER — Telehealth: Payer: Self-pay | Admitting: Obstetrics & Gynecology

## 2014-01-07 NOTE — Telephone Encounter (Signed)
Pt calling to speak with Janett Billow about the $136.43 shea paid for office visit.

## 2014-01-09 LAB — IPS PAP SMEAR ONLY

## 2014-04-05 DIAGNOSIS — E782 Mixed hyperlipidemia: Secondary | ICD-10-CM | POA: Diagnosis not present

## 2014-04-05 DIAGNOSIS — I10 Essential (primary) hypertension: Secondary | ICD-10-CM | POA: Diagnosis not present

## 2014-04-05 DIAGNOSIS — E1165 Type 2 diabetes mellitus with hyperglycemia: Secondary | ICD-10-CM | POA: Diagnosis not present

## 2014-04-05 DIAGNOSIS — K219 Gastro-esophageal reflux disease without esophagitis: Secondary | ICD-10-CM | POA: Diagnosis not present

## 2014-04-08 DIAGNOSIS — R399 Unspecified symptoms and signs involving the genitourinary system: Secondary | ICD-10-CM | POA: Diagnosis not present

## 2014-04-08 DIAGNOSIS — N390 Urinary tract infection, site not specified: Secondary | ICD-10-CM | POA: Diagnosis not present

## 2014-05-08 DIAGNOSIS — H60501 Unspecified acute noninfective otitis externa, right ear: Secondary | ICD-10-CM | POA: Diagnosis not present

## 2014-05-08 DIAGNOSIS — Z974 Presence of external hearing-aid: Secondary | ICD-10-CM | POA: Diagnosis not present

## 2014-05-08 DIAGNOSIS — H7202 Central perforation of tympanic membrane, left ear: Secondary | ICD-10-CM | POA: Diagnosis not present

## 2014-05-08 DIAGNOSIS — H9193 Unspecified hearing loss, bilateral: Secondary | ICD-10-CM | POA: Diagnosis not present

## 2014-06-17 DIAGNOSIS — J020 Streptococcal pharyngitis: Secondary | ICD-10-CM | POA: Diagnosis not present

## 2014-06-18 ENCOUNTER — Encounter: Payer: Self-pay | Admitting: Gynecologic Oncology

## 2014-06-18 ENCOUNTER — Ambulatory Visit: Payer: Commercial Managed Care - HMO | Attending: Gynecologic Oncology | Admitting: Gynecologic Oncology

## 2014-06-18 VITALS — BP 123/76 | HR 108 | Resp 18 | Ht 62.68 in | Wt 182.6 lb

## 2014-06-18 DIAGNOSIS — Z9049 Acquired absence of other specified parts of digestive tract: Secondary | ICD-10-CM | POA: Insufficient documentation

## 2014-06-18 DIAGNOSIS — Z8542 Personal history of malignant neoplasm of other parts of uterus: Secondary | ICD-10-CM

## 2014-06-18 DIAGNOSIS — C541 Malignant neoplasm of endometrium: Secondary | ICD-10-CM

## 2014-06-18 DIAGNOSIS — Z08 Encounter for follow-up examination after completed treatment for malignant neoplasm: Secondary | ICD-10-CM | POA: Insufficient documentation

## 2014-06-18 DIAGNOSIS — Z90710 Acquired absence of both cervix and uterus: Secondary | ICD-10-CM | POA: Insufficient documentation

## 2014-06-18 DIAGNOSIS — Z90722 Acquired absence of ovaries, bilateral: Secondary | ICD-10-CM | POA: Diagnosis not present

## 2014-06-18 NOTE — Patient Instructions (Addendum)
Follow-up with Dr. Sabra Heck or Joylene John in our office in 6 months and return to see Dr. Alycia Rossetti in one year

## 2014-06-18 NOTE — Progress Notes (Signed)
Consult Note: Gyn-Onc  Meagan [name] 69 y.o. female  CC:  Chief Complaint  Patient presents with  . endometrial cancer    HPI: Patient is a 69 year old gravida 1 para 1 with vaginal bleeding since June 2013. She underwent a D&C with [name] it revealed a grade 1 endometrial carcinoma. On November 26 of year 2013 she underwent a total robotic hysterectomy bilateral salpingo-oophorectomy bilateral pelvic lymph node dissection. Operative findings included a globular uterus with a 6 cm anterior myoma in a 3 cm small pedunculated myoma. She adhesive disease of the rectosigmoid colon to the left adnexa and the pelvic sidewall. Frozen section revealed no evidence of residual grade 1 endometrial adenocarcinoma.  Final pathology revealed: ENDOMETRIUM WITH SMALL FOCUS OF HIGHLY ATYPICAL ENDOMETRIAL GLANDS IN THE VICINITY OF PREVIOUS BIOPSY SITE / CURETTAGE CHANGES CONSISTENT WITH FOCAL ATYPICAL COMPLEX HYPERPLASIA, SEE COMMENT. - DEFINITIVE INVASIVE TUMOR OR TUMOR INVOLVING A LYMPH/VASCULAR SPACE IS NOT IDENTIFIED. - ADENOMYOSIS PRESENT. - BENIGN LEIOMYOMATA. - BENIGN CERVIX WITHOUT DYSPLASIA, ATYPIA OR MALIGNANCY IDENTIFIED. - BILATERAL OVARIES SHOW NO SIGNIFICANT PATHOLOGIC ABNORMALITIES. - BILATERAL FALLOPIAN TUBES SHOW NO SIGNIFICANT PATHOLOGIC ABNORMALITIES. 2. Lymph nodes, regional resection, left pelvic - SIX BENIGN LYMPH NODES WITH NO TUMOR SEEN (0/6). 3. Lymph nodes, regional resection, right pelvic - THREE BENIGN LYMPH NODES WITH NO TUMOR SEEN (0/3). Microscopic Comment 1. Deeper sections are cut and examined in the area where the foci of highly atypical glandular cells arepresent. Although definitive carcinoma is not present, the area of focal atypical complex hyperplasiaadjacent to previous biopsy site/curettage changes likely represents the area where the previous carcinomawas removed. Of note, all lymph nodes which are examined are negative for metastatic carcinoma (seespecimen  diagnosis for 2 and 3). The previous case ([id number]) is examined with the current case.Based on the previous case, the tumor is best staged as a pT1a, pN0, MX of FIGO stage IA tumor. BothDr. Nicoletta Dress and [name] have seen this case (in conjunction with the previous case) in consultation with agreement that the small focus of atypical endometrial glands (focal atypical complex hyperplasia) likely represents the area where the curettage removed the previously diagnosed tumor.  Interval History:  I last saw her in June of 2015 at which time her exam was negative. She comes in today for her surveillance exam. She is overall doing quite well. There are no new medical problems and her family. She's never had a mammogram. She states that she has never had one and refuses to schedule one. She states she knows of women who have had had biopsies because of false positive mammograms. Similarly she refuses a colonoscopy. We discussed that they are both proven to make early diagnoses of cancers and saved lives. She still is not interested in pursuing either one of these screening methodologies.  She saw Dr. Sabra Heck in December. At that time her exam was unremarkable and her Pap smear was negative.  She is stressed out today as her brother-in-law was 78 years old passed away recently. She also had a niece who passed away today from [place] of alcoholic liver disease. She is planning a related external itching. She does have occasional pain with intercourse and they have not had any benefit with any of vaginal lubricants. They have not tried estrogenic therapy she is fearful of this. They have not tried other positions. She's not really exercising she states that she needs to go to the gym more often as her husband goes fairly regularly. They have a yoga  program there and she seems to be interested in proceeding with that. She continues to have issues with her metformin causing loose stools particularly depending on  certain foods that she eats. She's lost 6 pounds since we saw her in June of last year. Her last Accu-Chek was 158. She's not sure what her last hemoglobin A1c was. She continues to decline any other screening procedures including mammography and colonoscopy.  Review of Systems  Constitutional: Denies fever. Skin: No rash, sores, jaundice, itching, or dryness.  Cardiovascular: No chest pain, shortness of breath, or edema  Pulmonary: No cough or wheeze.  Gastro Intestinal:  No nausea, vomiting, constipation, + diarrhea reported as above. No bright red blood per rectum or change in bowel movement.  Genitourinary: No frequency, urgency, or dysuria.  Denies vaginal bleeding and discharge.  Musculoskeletal: No myalgia, arthralgia, joint swelling or pain.  Neurologic: No weakness, numbness, or change in gait.  Psychology:  No changes   Current Meds:  Outpatient Encounter Prescriptions as of 06/18/2014  Medication Sig  . doxazosin (CARDURA) 2 MG tablet Take 2 mg by mouth at bedtime.  . metFORMIN (GLUCOPHAGE) 500 MG tablet Take 1,000 mg by mouth daily with breakfast. 500mg  at bedtime  . ranitidine (ZANTAC) 300 MG tablet Take 300 mg by mouth 2 (two) times daily.  Marland Kitchen triamterene-hydrochlorothiazide (MAXZIDE-25) 37.5-25 MG per tablet Take 1 tablet by mouth at bedtime.   . clobetasol (TEMOVATE) 0.05 % external solution   . nystatin (MYCOSTATIN) powder Apply topically 3 (three) times daily. Apply to affected area for up to 7 days (Patient not taking: Reported on 06/18/2014)  . nystatin cream (MYCOSTATIN) Apply 1 application topically 2 (two) times daily. Apply to affected area BID for up to 7 days. (Patient not taking: Reported on 06/18/2014)   No facility-administered encounter medications on file as of 06/18/2014.    Allergy:  Allergies  Allergen Reactions  . Doxycycline Hyclate   . Penicillins Swelling and Other (See Comments)    hallucinations  . Shrimp [Shellfish Allergy] Nausea And Vomiting  .  Sinutab Sinus Max St [Phenylephrine-Acetaminophen] Swelling  . Valacyclovir Other (See Comments)    Confusion    Social Hx:   History   Social History  . Marital Status: Married    Spouse Name: N/A  . Number of Children: N/A  . Years of Education: N/A   Occupational History  . Not on file.   Social History Main Topics  . Smoking status: Never Smoker   . Smokeless tobacco: Never Used  . Alcohol Use: No  . Drug Use: No  . Sexual Activity:    Partners: Male    Birth Control/ Protection: None     Comment: husband had a vasectomy, pt with hysterectomy   Other Topics Concern  . Not on file   Social History Narrative    Past Surgical Hx:  Past Surgical History  Procedure Laterality Date  . Dilation and curettage of uterus    . Wisdom tooth extraction    . Cholecystectomy    . Dilatation & currettage/hysteroscopy with resectocope  11/30/2011    Procedure: [place];  Surgeon: Peri Maris, MD;  Location: [facility];  Service: Gynecology;  Laterality: N/A;  . Robotic assisted total hysterectomy with bilateral salpingo oopherectomy  12/14/2011    Procedure: ROBOTIC ASSISTED TOTAL HYSTERECTOMY WITH BILATERAL SALPINGO OOPHORECTOMY;  Surgeon: Imagene Gurney A. Alycia Rossetti, MD;  Location: [facility];  Service: Gynecology;  Laterality: N/A;  . Lymph node dissection  12/14/2011    Procedure: LYMPH NODE DISSECTION;  Surgeon: Imagene Gurney A. Alycia Rossetti, MD;  Location: [facility];  Service: Gynecology;  Laterality: N/A;    Past Medical Hx:  Past Medical History  Diagnosis Date  . SVD (spontaneous vaginal delivery)     x 1  . UTI (lower urinary tract infection) 11/22/11    started abx on 11/24/11  . Hypertension   . Anxiety     no meds  . GERD (gastroesophageal reflux disease)     tx zantac  . H/O hiatal hernia   . Arthritis     knees  . Anemia     hx   . Diabetes mellitus without complication     on oral medication  . Cancer     endometrial cancer    Family  Hx:  Family History  Problem Relation Age of Onset  . Skin cancer Sister   . Cancer Paternal Aunt   . Heart disease Mother   . Heart disease Father     Vitals:  Blood pressure 123/76, pulse 108, resp. rate 18, height 5' 2.68" (1.592 m), weight 182 lb 9.6 oz (82.827 kg), last menstrual period 01/18/2001.  Physical Exam:  Well-nourished well-developed female in no acute distress.  Neck: Supple, no lymphadenopathy no thyromegaly.  Lungs: Clear to auscultation but he.  Cardiovascular: Regular rate and rhythm.  Abdomen well-healed surgical incisions. There is no appreciable incisional hernias. Abdomen is soft and nontender. There are no palpable masses or hepatosplenomegaly. Exam is somewhat limited by habitus.  Groins: No edema  Extremities: Minimal edema.  Pelvic: Normal external female genitalia. The vaginal cuff is intact with no visible lesions or discharge. Atrophic.  Bimanual examination was no masses or nodularity. Rectal confirms.  Assessment/Plan: 69 year old with a stage IA grade 1 endometrial carcinoma. She has no evidence of recurrent disease. She will see Dr. Sabra Heck in 6 months and return to see [place] in 1 year. She is having some issues with her insurance covering her visits to Dr. Ammie Ferrier office. If she cannot resolve that in the interim, she may follow-up with [place] in 6 months. She will call and confirm that with [place]. Otherwise she will see Dr. Sabra Heck in 6 months and she'll need a Pap smear at that time.  Parris Signer A., MD 06/18/2014, 2:59 PM

## 2014-06-20 ENCOUNTER — Ambulatory Visit: Payer: Medicare HMO | Admitting: Gynecologic Oncology

## 2014-07-24 DIAGNOSIS — H66002 Acute suppurative otitis media without spontaneous rupture of ear drum, left ear: Secondary | ICD-10-CM | POA: Diagnosis not present

## 2014-07-24 DIAGNOSIS — H7202 Central perforation of tympanic membrane, left ear: Secondary | ICD-10-CM | POA: Diagnosis not present

## 2014-07-28 DIAGNOSIS — J209 Acute bronchitis, unspecified: Secondary | ICD-10-CM | POA: Diagnosis not present

## 2014-08-03 DIAGNOSIS — J209 Acute bronchitis, unspecified: Secondary | ICD-10-CM | POA: Diagnosis not present

## 2014-08-03 DIAGNOSIS — R05 Cough: Secondary | ICD-10-CM | POA: Diagnosis not present

## 2014-08-09 DIAGNOSIS — E1165 Type 2 diabetes mellitus with hyperglycemia: Secondary | ICD-10-CM | POA: Diagnosis not present

## 2014-08-09 DIAGNOSIS — K219 Gastro-esophageal reflux disease without esophagitis: Secondary | ICD-10-CM | POA: Diagnosis not present

## 2014-08-09 DIAGNOSIS — I10 Essential (primary) hypertension: Secondary | ICD-10-CM | POA: Diagnosis not present

## 2014-08-09 DIAGNOSIS — J209 Acute bronchitis, unspecified: Secondary | ICD-10-CM | POA: Diagnosis not present

## 2014-08-09 DIAGNOSIS — E782 Mixed hyperlipidemia: Secondary | ICD-10-CM | POA: Diagnosis not present

## 2014-09-03 DIAGNOSIS — E1165 Type 2 diabetes mellitus with hyperglycemia: Secondary | ICD-10-CM | POA: Diagnosis not present

## 2014-09-03 DIAGNOSIS — I10 Essential (primary) hypertension: Secondary | ICD-10-CM | POA: Diagnosis not present

## 2014-09-03 DIAGNOSIS — E782 Mixed hyperlipidemia: Secondary | ICD-10-CM | POA: Diagnosis not present

## 2014-12-03 DIAGNOSIS — J019 Acute sinusitis, unspecified: Secondary | ICD-10-CM | POA: Diagnosis not present

## 2015-01-08 ENCOUNTER — Ambulatory Visit: Payer: Commercial Managed Care - HMO | Admitting: Gynecologic Oncology

## 2015-01-24 ENCOUNTER — Ambulatory Visit: Payer: Commercial Managed Care - HMO | Admitting: Gynecologic Oncology

## 2015-01-28 DIAGNOSIS — E782 Mixed hyperlipidemia: Secondary | ICD-10-CM | POA: Diagnosis not present

## 2015-01-28 DIAGNOSIS — Z7984 Long term (current) use of oral hypoglycemic drugs: Secondary | ICD-10-CM | POA: Diagnosis not present

## 2015-01-28 DIAGNOSIS — E1165 Type 2 diabetes mellitus with hyperglycemia: Secondary | ICD-10-CM | POA: Diagnosis not present

## 2015-01-28 DIAGNOSIS — I10 Essential (primary) hypertension: Secondary | ICD-10-CM | POA: Diagnosis not present

## 2015-02-12 ENCOUNTER — Encounter: Payer: Self-pay | Admitting: Gynecologic Oncology

## 2015-02-12 ENCOUNTER — Ambulatory Visit: Payer: Commercial Managed Care - HMO | Attending: Gynecologic Oncology | Admitting: Gynecologic Oncology

## 2015-02-12 ENCOUNTER — Other Ambulatory Visit (HOSPITAL_COMMUNITY)
Admission: RE | Admit: 2015-02-12 | Discharge: 2015-02-12 | Disposition: A | Discharge status: Home / Self Care | Payer: Commercial Managed Care - HMO | Source: Ambulatory Visit | Attending: Gynecologic Oncology | Admitting: Gynecologic Oncology

## 2015-02-12 VITALS — BP 159/61 | HR 79 | Temp 98.2°F | Resp 20 | Ht 62.68 in | Wt 177.3 lb

## 2015-02-12 DIAGNOSIS — E119 Type 2 diabetes mellitus without complications: Secondary | ICD-10-CM | POA: Insufficient documentation

## 2015-02-12 DIAGNOSIS — E785 Hyperlipidemia, unspecified: Secondary | ICD-10-CM | POA: Insufficient documentation

## 2015-02-12 DIAGNOSIS — Z1151 Encounter for screening for human papillomavirus (HPV): Secondary | ICD-10-CM | POA: Insufficient documentation

## 2015-02-12 DIAGNOSIS — C541 Malignant neoplasm of endometrium: Secondary | ICD-10-CM | POA: Diagnosis not present

## 2015-02-12 DIAGNOSIS — I10 Essential (primary) hypertension: Secondary | ICD-10-CM | POA: Insufficient documentation

## 2015-02-12 DIAGNOSIS — E1169 Type 2 diabetes mellitus with other specified complication: Secondary | ICD-10-CM | POA: Insufficient documentation

## 2015-02-12 DIAGNOSIS — E782 Mixed hyperlipidemia: Secondary | ICD-10-CM | POA: Insufficient documentation

## 2015-02-12 DIAGNOSIS — L309 Dermatitis, unspecified: Secondary | ICD-10-CM | POA: Insufficient documentation

## 2015-02-12 DIAGNOSIS — K219 Gastro-esophageal reflux disease without esophagitis: Secondary | ICD-10-CM | POA: Insufficient documentation

## 2015-02-12 DIAGNOSIS — R87610 Atypical squamous cells of undetermined significance on cytologic smear of cervix (ASC-US): Secondary | ICD-10-CM | POA: Diagnosis not present

## 2015-02-12 DIAGNOSIS — Z01411 Encounter for gynecological examination (general) (routine) with abnormal findings: Secondary | ICD-10-CM | POA: Insufficient documentation

## 2015-02-12 DIAGNOSIS — L219 Seborrheic dermatitis, unspecified: Secondary | ICD-10-CM | POA: Insufficient documentation

## 2015-02-12 NOTE — Progress Notes (Signed)
Consult Note: Gyn-Onc  Meagan [name] 70 y.o. female  CC:  Chief Complaint  Patient presents with  . endometrial cancer    MD Follow up    HPI: Patient is a 70 year old gravida 1 para 1 with vaginal bleeding since June 2013. She underwent a D&C with [name] it revealed a grade 1 endometrial carcinoma. On November 26 of year 2013 she underwent a total robotic hysterectomy bilateral salpingo-oophorectomy bilateral pelvic lymph node dissection. Operative findings included a globular uterus with a 6 cm anterior myoma in a 3 cm small pedunculated myoma. She adhesive disease of the rectosigmoid colon to the left adnexa and the pelvic sidewall. Frozen section revealed no evidence of residual grade 1 endometrial adenocarcinoma.  Final pathology revealed: ENDOMETRIUM WITH SMALL FOCUS OF HIGHLY ATYPICAL ENDOMETRIAL GLANDS IN THE VICINITY OF PREVIOUS BIOPSY SITE / CURETTAGE CHANGES CONSISTENT WITH FOCAL ATYPICAL COMPLEX HYPERPLASIA, SEE COMMENT. - DEFINITIVE INVASIVE TUMOR OR TUMOR INVOLVING A LYMPH/VASCULAR SPACE IS NOT IDENTIFIED. - ADENOMYOSIS PRESENT. - BENIGN LEIOMYOMATA. - BENIGN CERVIX WITHOUT DYSPLASIA, ATYPIA OR MALIGNANCY IDENTIFIED. - BILATERAL OVARIES SHOW NO SIGNIFICANT PATHOLOGIC ABNORMALITIES. - BILATERAL FALLOPIAN TUBES SHOW NO SIGNIFICANT PATHOLOGIC ABNORMALITIES. 2. Lymph nodes, regional resection, left pelvic - SIX BENIGN LYMPH NODES WITH NO TUMOR SEEN (0/6). 3. Lymph nodes, regional resection, right pelvic - THREE BENIGN LYMPH NODES WITH NO TUMOR SEEN (0/3). Microscopic Comment 1. Deeper sections are cut and examined in the area where the foci of highly atypical glandular cells arepresent. Although definitive carcinoma is not present, the area of focal atypical complex hyperplasiaadjacent to previous biopsy site/curettage changes likely represents the area where the previous carcinomawas removed. Of note, all lymph nodes which are examined are negative for metastatic  carcinoma (seespecimen diagnosis for 2 and 3). The previous case [id number]) is examined with the current case.Based on the previous case, the tumor is best staged as a pT1a, pN0, MX of FIGO stage IA tumor. BothDr. Nicoletta Dress and [name] have seen this case (in conjunction with the previous case) in consultation with agreement that the small focus of atypical endometrial glands (focal atypical complex hyperplasia) likely represents the area where the curettage removed the previously diagnosed tumor.  Interval History:  I last saw her in May of 2016 at which time her exam was negative. She comes in today for her surveillance exam. She is overall doing quite well. There are no new medical problems and her family. She's never had a mammogram. She states that she has never had one and refuses to schedule one. She states she knows of women who have had had biopsies because of false positive mammograms. Similarly she refuses a colonoscopy. We discussed that they are both proven to make early diagnoses of cancers and saved lives. She still is not interested in pursuing either one of these screening methodologies.  She has noticed some small bumps in her vagina. She states this is been a long-standing issue and they're intermittent. She stopped her metformin as her sister is on hemodialysis and the patient herself had a small increase in her creatinine and feels that it was from her metformin. With diet she's lost about 5 pounds and her Accu-Cheks were 140 to 1:15 the morning which is not significantly different than when she was on her metformin. With the metformin she had some loose stools. Since coming off of that she's noticed some constipation. She is walking about 20-30 minutes a day.  Review of Systems  Constitutional: Denies fever. Skin: No rash, sores,  jaundice, itching, or dryness.  Cardiovascular: No chest pain, shortness of breath, or edema  Pulmonary: No cough or wheeze.  Gastro Intestinal:  No  nausea, vomiting, slight constipation. No bright red blood per rectum or change in bowel movement.  Genitourinary: No frequency, urgency, or dysuria.  Denies vaginal bleeding and discharge.  Musculoskeletal: No myalgia, arthralgia, joint swelling or pain.  Neurologic: No weakness, numbness, or change in gait.  Psychology:  No changes  Current Meds:  Outpatient Encounter Prescriptions as of 02/12/2015  Medication Sig  . clobetasol (TEMOVATE) 0.05 % external solution   . doxazosin (CARDURA) 2 MG tablet Take 2 mg by mouth at bedtime.  . ranitidine (ZANTAC) 300 MG tablet Take 300 mg by mouth 2 (two) times daily.  Marland Kitchen triamterene-hydrochlorothiazide (MAXZIDE-25) 37.5-25 MG per tablet Take 1 tablet by mouth at bedtime.   Marland Kitchen nystatin (MYCOSTATIN) powder Apply topically 3 (three) times daily. Apply to affected area for up to 7 days (Patient not taking: Reported on 06/18/2014)  . nystatin cream (MYCOSTATIN) Apply 1 application topically 2 (two) times daily. Apply to affected area BID for up to 7 days. (Patient not taking: Reported on 06/18/2014)  . [DISCONTINUED] metFORMIN (GLUCOPHAGE) 500 MG tablet Take 1,000 mg by mouth daily with breakfast. 500mg  at bedtime   No facility-administered encounter medications on file as of 02/12/2015.    Allergy:  Allergies  Allergen Reactions  . Doxycycline Hyclate   . Penicillins Swelling and Other (See Comments)    hallucinations  . Shrimp [Shellfish Allergy] Nausea And Vomiting  . Sinutab Sinus Max St [Phenylephrine-Acetaminophen] Swelling  . Valacyclovir Other (See Comments)    Confusion    Social Hx:   Social History   Social History  . Marital Status: Married    Spouse Name: N/A  . Number of Children: N/A  . Years of Education: N/A   Occupational History  . Not on file.   Social History Main Topics  . Smoking status: Never Smoker   . Smokeless tobacco: Never Used  . Alcohol Use: No  . Drug Use: No  . Sexual Activity:    Partners: Male     Birth Control/ Protection: None     Comment: husband had a vasectomy, pt with hysterectomy   Other Topics Concern  . Not on file   Social History Narrative    Past Surgical Hx:  Past Surgical History  Procedure Laterality Date  . Dilation and curettage of uterus    . Wisdom tooth extraction    . Cholecystectomy    . Dilatation & currettage/hysteroscopy with resectocope  11/30/2011    Procedure: [place];  Surgeon: Peri Maris, MD;  Location: [facility];  Service: Gynecology;  Laterality: N/A;  . Robotic assisted total hysterectomy with bilateral salpingo oopherectomy  12/14/2011    Procedure: ROBOTIC ASSISTED TOTAL HYSTERECTOMY WITH BILATERAL SALPINGO OOPHORECTOMY;  Surgeon: Imagene Gurney A. Alycia Rossetti, MD;  Location: [facility];  Service: Gynecology;  Laterality: N/A;  . Lymph node dissection  12/14/2011    Procedure: LYMPH NODE DISSECTION;  Surgeon: Imagene Gurney A. Alycia Rossetti, MD;  Location: [facility];  Service: Gynecology;  Laterality: N/A;    Past Medical Hx:  Past Medical History  Diagnosis Date  . SVD (spontaneous vaginal delivery)     x 1  . UTI (lower urinary tract infection) 11/22/11    started abx on 11/24/11  . Hypertension   . Anxiety     no meds  . GERD (gastroesophageal reflux disease)  tx zantac  . H/O hiatal hernia   . Arthritis     knees  . Anemia     hx   . Diabetes mellitus without complication ([place])     on oral medication  . Cancer [facility])     endometrial cancer    Family Hx:  Family History  Problem Relation Age of Onset  . Skin cancer Sister   . Cancer Paternal Aunt   . Heart disease Mother   . Heart disease Father     Vitals:  Blood pressure 159/61, pulse 79, temperature 98.2 F (36.8 C), temperature source Oral, resp. rate 20, height 5' 2.68" (1.592 m), weight 177 lb 4.8 oz (80.423 kg), last menstrual period 01/18/2001.  Physical Exam:  Well-nourished well-developed female in no acute distress.  Neck: Supple, no  lymphadenopathy no thyromegaly.  Lungs: Clear to auscultation but he.  Cardiovascular: Regular rate and rhythm.  Abdomen well-healed surgical incisions. There is no appreciable incisional hernias. Abdomen is soft and nontender. There are no palpable masses or hepatosplenomegaly. Exam is somewhat limited by habitus.  Groins: No edema  Extremities: Minimal edema.  Pelvic: Normal external female genitalia. The vaginal cuff is intact with no visible lesions or discharge. Atrophic. Pap smear submitted without difficulty. Bimanual examination was no masses or nodularity. Rectal confirms.  Assessment/Plan: 70 year old with a stage IA grade 1 endometrial carcinoma. She has no evidence of recurrent disease. She will return to see [place] in 6 months and I will notify her of the results for Pap smear from today.   Silvino Selman A., MD 02/12/2015, 9:34 AM

## 2015-02-12 NOTE — Patient Instructions (Addendum)
We will notify you of the results of your Pap smear. Please return to see [place] in 6 months.  Please call in April or May to schedule your appointment for July 2017 with Dr. Alycia Rossetti.

## 2015-02-17 LAB — CYTOLOGY - PAP

## 2015-02-25 ENCOUNTER — Telehealth: Payer: Self-pay | Admitting: Gynecologic Oncology

## 2015-02-25 NOTE — Telephone Encounter (Signed)
Patient advised of pap smear results. Advised of Dr. Elenora Gamma recommendations for repeat pap smear in one year since HPV was negative.  No concerns voiced.  Patient is to follow up in six months or sooner if needed.

## 2015-04-06 DIAGNOSIS — J069 Acute upper respiratory infection, unspecified: Secondary | ICD-10-CM | POA: Diagnosis not present

## 2015-04-27 DIAGNOSIS — E860 Dehydration: Secondary | ICD-10-CM | POA: Diagnosis not present

## 2015-04-27 DIAGNOSIS — J101 Influenza due to other identified influenza virus with other respiratory manifestations: Secondary | ICD-10-CM | POA: Diagnosis not present

## 2015-04-27 DIAGNOSIS — R112 Nausea with vomiting, unspecified: Secondary | ICD-10-CM | POA: Diagnosis not present

## 2015-04-27 DIAGNOSIS — R509 Fever, unspecified: Secondary | ICD-10-CM | POA: Diagnosis not present

## 2015-05-01 DIAGNOSIS — E782 Mixed hyperlipidemia: Secondary | ICD-10-CM | POA: Diagnosis not present

## 2015-05-01 DIAGNOSIS — L821 Other seborrheic keratosis: Secondary | ICD-10-CM | POA: Diagnosis not present

## 2015-05-01 DIAGNOSIS — J40 Bronchitis, not specified as acute or chronic: Secondary | ICD-10-CM | POA: Diagnosis not present

## 2015-05-01 DIAGNOSIS — E1165 Type 2 diabetes mellitus with hyperglycemia: Secondary | ICD-10-CM | POA: Diagnosis not present

## 2015-05-01 DIAGNOSIS — N183 Chronic kidney disease, stage 3 (moderate): Secondary | ICD-10-CM | POA: Diagnosis not present

## 2015-05-01 DIAGNOSIS — I10 Essential (primary) hypertension: Secondary | ICD-10-CM | POA: Diagnosis not present

## 2015-05-01 DIAGNOSIS — J111 Influenza due to unidentified influenza virus with other respiratory manifestations: Secondary | ICD-10-CM | POA: Diagnosis not present

## 2015-05-15 DIAGNOSIS — I10 Essential (primary) hypertension: Secondary | ICD-10-CM | POA: Diagnosis not present

## 2015-07-30 ENCOUNTER — Ambulatory Visit: Payer: Commercial Managed Care - HMO | Admitting: Gynecologic Oncology

## 2015-08-12 DIAGNOSIS — H7292 Unspecified perforation of tympanic membrane, left ear: Secondary | ICD-10-CM | POA: Insufficient documentation

## 2015-08-12 DIAGNOSIS — H9212 Otorrhea, left ear: Secondary | ICD-10-CM | POA: Diagnosis not present

## 2015-08-12 DIAGNOSIS — H7202 Central perforation of tympanic membrane, left ear: Secondary | ICD-10-CM | POA: Diagnosis not present

## 2015-08-28 DIAGNOSIS — L309 Dermatitis, unspecified: Secondary | ICD-10-CM | POA: Diagnosis not present

## 2015-08-28 DIAGNOSIS — H7202 Central perforation of tympanic membrane, left ear: Secondary | ICD-10-CM | POA: Diagnosis not present

## 2015-08-28 DIAGNOSIS — H9212 Otorrhea, left ear: Secondary | ICD-10-CM | POA: Diagnosis not present

## 2015-09-10 ENCOUNTER — Ambulatory Visit: Payer: Commercial Managed Care - HMO | Attending: Gynecologic Oncology | Admitting: Gynecologic Oncology

## 2015-09-10 ENCOUNTER — Encounter: Payer: Self-pay | Admitting: Gynecologic Oncology

## 2015-09-10 ENCOUNTER — Other Ambulatory Visit: Payer: Self-pay | Admitting: Gynecologic Oncology

## 2015-09-10 VITALS — BP 132/62 | HR 89 | Temp 98.6°F | Resp 18 | Wt 165.8 lb

## 2015-09-10 DIAGNOSIS — E119 Type 2 diabetes mellitus without complications: Secondary | ICD-10-CM | POA: Diagnosis not present

## 2015-09-10 DIAGNOSIS — D649 Anemia, unspecified: Secondary | ICD-10-CM | POA: Diagnosis not present

## 2015-09-10 DIAGNOSIS — N941 Unspecified dyspareunia: Secondary | ICD-10-CM

## 2015-09-10 DIAGNOSIS — Z7982 Long term (current) use of aspirin: Secondary | ICD-10-CM | POA: Diagnosis not present

## 2015-09-10 DIAGNOSIS — Z888 Allergy status to other drugs, medicaments and biological substances status: Secondary | ICD-10-CM | POA: Insufficient documentation

## 2015-09-10 DIAGNOSIS — Z809 Family history of malignant neoplasm, unspecified: Secondary | ICD-10-CM | POA: Insufficient documentation

## 2015-09-10 DIAGNOSIS — Z87440 Personal history of urinary (tract) infections: Secondary | ICD-10-CM | POA: Insufficient documentation

## 2015-09-10 DIAGNOSIS — M1990 Unspecified osteoarthritis, unspecified site: Secondary | ICD-10-CM | POA: Diagnosis not present

## 2015-09-10 DIAGNOSIS — Z880 Allergy status to penicillin: Secondary | ICD-10-CM | POA: Diagnosis not present

## 2015-09-10 DIAGNOSIS — K219 Gastro-esophageal reflux disease without esophagitis: Secondary | ICD-10-CM | POA: Diagnosis not present

## 2015-09-10 DIAGNOSIS — I10 Essential (primary) hypertension: Secondary | ICD-10-CM | POA: Diagnosis not present

## 2015-09-10 DIAGNOSIS — Z91013 Allergy to seafood: Secondary | ICD-10-CM | POA: Diagnosis not present

## 2015-09-10 DIAGNOSIS — Z90710 Acquired absence of both cervix and uterus: Secondary | ICD-10-CM | POA: Insufficient documentation

## 2015-09-10 DIAGNOSIS — B373 Candidiasis of vulva and vagina: Secondary | ICD-10-CM

## 2015-09-10 DIAGNOSIS — C541 Malignant neoplasm of endometrium: Secondary | ICD-10-CM

## 2015-09-10 DIAGNOSIS — Z8249 Family history of ischemic heart disease and other diseases of the circulatory system: Secondary | ICD-10-CM | POA: Diagnosis not present

## 2015-09-10 DIAGNOSIS — Z9049 Acquired absence of other specified parts of digestive tract: Secondary | ICD-10-CM | POA: Insufficient documentation

## 2015-09-10 DIAGNOSIS — Z8719 Personal history of other diseases of the digestive system: Secondary | ICD-10-CM | POA: Diagnosis not present

## 2015-09-10 DIAGNOSIS — F419 Anxiety disorder, unspecified: Secondary | ICD-10-CM | POA: Diagnosis not present

## 2015-09-10 DIAGNOSIS — Z90722 Acquired absence of ovaries, bilateral: Secondary | ICD-10-CM | POA: Diagnosis not present

## 2015-09-10 MED ORDER — ESTROGENS, CONJUGATED 0.625 MG/GM VA CREA: 1.0000 g | TOPICAL_CREAM | VAGINAL | Status: DC

## 2015-09-10 MED ORDER — NYSTATIN 100000 UNIT/GM EX CREA: 1.0000 "application " | TOPICAL_CREAM | Freq: Two times a day (BID) | CUTANEOUS | 2 refills | Status: DC

## 2015-09-10 NOTE — Progress Notes (Signed)
Consult Note: Gyn-Onc  Meagan [name] 70 y.o. female  CC:  Chief Complaint  Patient presents with  . endometrial cancer    follow up    HPI: Patient is a 70 year old gravida 1 para 1 with vaginal bleeding since June 2013. She underwent a D&C with Dr. Joan [name] it revealed a grade 1 endometrial carcinoma. On December 14, 2011 she underwent a total robotic hysterectomy bilateral salpingo-oophorectomy bilateral pelvic lymph node dissection. Operative findings included a globular uterus with a 6 cm anterior myoma in a 3 cm small pedunculated myoma. She adhesive disease of the rectosigmoid colon to the left adnexa and the pelvic sidewall. Frozen section revealed no evidence of residual grade 1 endometrial adenocarcinoma.  Final pathology revealed: ENDOMETRIUM WITH SMALL FOCUS OF HIGHLY ATYPICAL ENDOMETRIAL GLANDS IN THE VICINITY OF PREVIOUS BIOPSY SITE / CURETTAGE CHANGES CONSISTENT WITH FOCAL ATYPICAL COMPLEX HYPERPLASIA, SEE COMMENT. - DEFINITIVE INVASIVE TUMOR OR TUMOR INVOLVING A LYMPH/VASCULAR SPACE IS NOT IDENTIFIED. - ADENOMYOSIS PRESENT. - BENIGN LEIOMYOMATA. - BENIGN CERVIX WITHOUT DYSPLASIA, ATYPIA OR MALIGNANCY IDENTIFIED. - BILATERAL OVARIES SHOW NO SIGNIFICANT PATHOLOGIC ABNORMALITIES. - BILATERAL FALLOPIAN TUBES SHOW NO SIGNIFICANT PATHOLOGIC ABNORMALITIES. 2. Lymph nodes, regional resection, left pelvic - SIX BENIGN LYMPH NODES WITH NO TUMOR SEEN (0/6). 3. Lymph nodes, regional resection, right pelvic - THREE BENIGN LYMPH NODES WITH NO TUMOR SEEN (0/3). Microscopic Comment 1. Deeper sections are cut and examined in the area where the foci of highly atypical glandular cells arepresent. Although definitive carcinoma is not present, the area of focal atypical complex hyperplasiaadjacent to previous biopsy site/curettage changes likely represents the area where the previous carcinomawas removed. Of note, all lymph nodes which are examined are negative for metastatic carcinoma  (seespecimen diagnosis for 2 and 3). The previous case [id number]) is examined with the current case.Based on the previous case, the tumor is best staged as a pT1a, pN0, MX of FIGO stage IA tumor. BothDr. Nicoletta [name] and Dr. Gari [name] have seen this case (in conjunction with the previous case) in consultation with agreement that the small focus of atypical endometrial glands (focal atypical complex hyperplasia) likely represents the area where the curettage removed the previously diagnosed tumor.  Interval History:  I last saw her in January 2017 at which time her exam was negative. Pap revealed ASCUS with negative HR-HPV. She comes in today for her surveillance exam. She is overall doing quite well. There are no new medical problems and her family. She remains off her metformin and her sugars are in the 120s. She is walking fairly well. She's had more stress as her son is getting a divorce and she is worried about what will happen to her grandson. Her sons ex-wife is talked about moving to the beach. She did continues to complain of dyspareunia. She states that he used lubricants without any significant relief for benefit. Since coming off the metformin her renal function has improved.   She's never had a mammogram. She states that she has never had one and refuses to schedule one. She states she knows of women who have had had biopsies because of false positive mammograms. Similarly she refuses a colonoscopy. We discussed that they are both proven to make early diagnoses of cancers and saved lives. She still is not interested in pursuing either one of these screening methodologies.  Review of Systems  Constitutional: Denies fever. Skin: No rash, sores, jaundice, itching, or dryness.  Cardiovascular: No chest pain, shortness of breath, or edema  Pulmonary: No cough or wheeze.  Gastro Intestinal:  No nausea, vomiting, slight constipation. No bright red blood per rectum. Genitourinary: No frequency, urgency, or  dysuria.  Denies vaginal bleeding and discharge.  Musculoskeletal: No joint pain Neurologic: No weakness, numbness, or change in gait.  Psychology:  No changes  Current Meds:  Outpatient Encounter Prescriptions as of 09/10/2015  Medication Sig Dispense Refill  . aspirin EC 81 MG tablet Take 81 mg by mouth daily.    . clobetasol (TEMOVATE) 0.05 % external solution     . doxazosin (CARDURA) 2 MG tablet Take 2 mg by mouth at bedtime.    . mometasone (ELOCON) 0.1 % lotion     . triamterene-hydrochlorothiazide (MAXZIDE-25) 37.5-25 MG per tablet Take 1 tablet by mouth at bedtime.     Marland Kitchen nystatin (MYCOSTATIN) powder Apply topically 3 (three) times daily. Apply to affected area for up to 7 days (Patient not taking: Reported on 06/18/2014) 30 g 2  . nystatin cream (MYCOSTATIN) Apply 1 application topically 2 (two) times daily. Apply to affected area BID for up to 7 days. (Patient not taking: Reported on 06/18/2014) 30 g 2  . [DISCONTINUED] ranitidine (ZANTAC) 300 MG tablet Take 300 mg by mouth 2 (two) times daily.     No facility-administered encounter medications on file as of 09/10/2015.     Allergy:  Allergies  Allergen Reactions  . Doxycycline Hyclate   . Penicillins Swelling and Other (See Comments)    hallucinations  . Shrimp [Shellfish Allergy] Nausea And Vomiting  . Sinutab Sinus Max St [Phenylephrine-Acetaminophen] Swelling  . Valacyclovir Other (See Comments)    Confusion    Social Hx:   Social History   Social History  . Marital status: Married    Spouse name: N/A  . Number of children: N/A  . Years of education: N/A   Occupational History  . Not on file.   Social History Main Topics  . Smoking status: Never Smoker  . Smokeless tobacco: Never Used  . Alcohol use No  . Drug use: No  . Sexual activity: Yes    Partners: Male    Birth control/ protection: None     Comment: husband had a vasectomy, pt with hysterectomy   Other Topics Concern  . Not on file   Social  History Narrative  . No narrative on file    Past Surgical Hx:  Past Surgical History:  Procedure Laterality Date  . CHOLECYSTECTOMY    . DILATATION & CURRETTAGE/HYSTEROSCOPY WITH RESECTOCOPE  11/30/2011   Procedure: [place];  Surgeon: Peri Maris, MD;  Location: [facility];  Service: Gynecology;  Laterality: N/A;  . DILATION AND CURETTAGE OF UTERUS    . LYMPH NODE DISSECTION  12/14/2011   Procedure: LYMPH NODE DISSECTION;  Surgeon: Imagene Gurney A. Alycia Rossetti, MD;  Location: [facility];  Service: Gynecology;  Laterality: N/A;  . ROBOTIC ASSISTED TOTAL HYSTERECTOMY WITH BILATERAL SALPINGO OOPHERECTOMY  12/14/2011   Procedure: ROBOTIC ASSISTED TOTAL HYSTERECTOMY WITH BILATERAL SALPINGO OOPHORECTOMY;  Surgeon: Imagene Gurney A. Alycia Rossetti, MD;  Location: [facility];  Service: Gynecology;  Laterality: N/A;  . WISDOM TOOTH EXTRACTION      Past Medical Hx:  Past Medical History:  Diagnosis Date  . Anemia    hx   . Anxiety    no meds  . Arthritis    knees  . Cancer [facility])    endometrial cancer  . Diabetes mellitus without complication ([place])    on oral medication  . GERD (gastroesophageal reflux disease)  tx zantac  . H/O hiatal hernia   . Hypertension   . SVD (spontaneous vaginal delivery)    x 1  . UTI (lower urinary tract infection) 11/22/11   started abx on 11/24/11    Family Hx:  Family History  Problem Relation Age of Onset  . Skin cancer Sister   . Cancer Paternal Aunt   . Heart disease Mother   . Heart disease Father     Vitals:  Blood pressure 132/62, pulse 89, temperature 98.6 F (37 C), temperature source Oral, resp. rate 18, weight 165 lb 12.8 oz (75.2 kg), last menstrual period 01/18/2001, SpO2 96 %.  Physical Exam:  Well-nourished well-developed female in no acute distress.  Neck: Supple, no lymphadenopathy no thyromegaly.  Lungs: Clear to auscultation but he.  Cardiovascular: Regular rate and rhythm.  Abdomen well-healed surgical  incisions. There is no appreciable incisional hernias. Abdomen is soft and nontender. There are no palpable masses or hepatosplenomegaly. Exam is somewhat limited by habitus.   Groins: No edema  Extremities: Minimal edema.  Pelvic: Normal external female genitalia with Candida in the perianal region. The vaginal cuff is intact with no visible lesions or discharge. Atrophic.  Bimanual examination was no masses or nodularity. Rectal confirms.  Assessment/Plan: 70 year old with a stage IA grade 1 endometrial carcinoma. She has no evidence of recurrent disease. She will return to see [place] in 6 months.  With regards to her dyspareunia they have used lubricants without benefit. She is interested in proceeding with vaginal estrogen. A prescription for Premarin was provided to her. She'll use it 3 times a week. She was also prescribed a prescription for nystatin cream for the external Candida that she has on the vulva.  Trenton Verne A., MD 09/10/2015, 10:48 AM

## 2015-09-10 NOTE — Patient Instructions (Addendum)
Return to clinic in 6 months.  Please call our office in October or November to schedule an appt for Feb 2018.  Begin using premarin cream in the vagina three times a week.  If symptoms improve, you can back down to twice a week.  Also begin using nystatin cream to the outside of your vulva to treat yeast.  Once it improves, you can stop use.    Please call for any questions or concerns.  Conjugated Estrogens vaginal cream What is this medicine? CONJUGATED ESTROGENS (CON ju gate ed ESS troe jenz) are a mixture of female hormones. This cream can help relieve symptoms associated with menopause.like vaginal dryness and irritation. This medicine may be used for other purposes; ask your health care provider or pharmacist if you have questions. What should I tell my health care provider before I take this medicine? They need to know if you have any of these conditions: -abnormal vaginal bleeding -blood vessel disease or blood clots -breast, cervical, endometrial, or uterine cancer -dementia -diabetes -gallbladder disease -heart disease or recent heart attack -high blood pressure -high cholesterol -high level of calcium in the blood -hysterectomy -kidney disease -liver disease -migraine headaches -protein C deficiency -protein S deficiency -stroke -systemic lupus erythematosus (SLE) -tobacco smoker -an unusual or allergic reaction to estrogens other medicines, foods, dyes, or preservatives -pregnant or trying to get pregnant -breast-feeding How should I use this medicine? This medicine is for use in the vagina only. Do not take by mouth. Follow the directions on the prescription label. Use at bedtime unless otherwise directed by your doctor or health care professional. Use the special applicator supplied with the cream. Wash hands before and after use. Fill the applicator with the cream and remove from the tube. Lie on your back, part and bend your knees. Insert the applicator into the  vagina and push the plunger to expel the cream into the vagina. Wash the applicator with warm soapy water and rinse well. Use exactly as directed for the complete length of time prescribed. Do not stop using except on the advice of your doctor or health care professional. Talk to your pediatrician regarding the use of this medicine in children. Special care may be needed. A patient package insert for the product will be given with each prescription and refill. Read this sheet carefully each time. The sheet may change frequently. Overdosage: If you think you have taken too much of this medicine contact a poison control center or emergency room at once. NOTE: This medicine is only for you. Do not share this medicine with others. What if I miss a dose? If you miss a dose, use it as soon as you can. If it is almost time for your next dose, use only that dose. Do not use double or extra doses. What may interact with this medicine? Do not take this medicine with any of the following medications: -aromatase inhibitors like aminoglutethimide, anastrozole, exemestane, letrozole, testolactone This medicine may also interact with the following medications: -barbiturates used for inducing sleep or treating seizures -carbamazepine -grapefruit juice -medicines for fungal infections like itraconazole and ketoconazole -raloxifene or tamoxifen -rifabutin -rifampin -rifapentine -ritonavir -some antibiotics used to treat infections -St. John's Wort -warfarin This list may not describe all possible interactions. Give your health care provider a list of all the medicines, herbs, non-prescription drugs, or dietary supplements you use. Also tell them if you smoke, drink alcohol, or use illegal drugs. Some items may interact with your medicine. What should I  watch for while using this medicine? Visit your health care professional for regular checks on your progress. You will need a regular breast and pelvic exam.  You should also discuss the need for regular mammograms with your health care professional, and follow his or her guidelines. This medicine can make your body retain fluid, making your fingers, hands, or ankles swell. Your blood pressure can go up. Contact your doctor or health care professional if you feel you are retaining fluid. If you have any reason to think you are pregnant; stop taking this medicine at once and contact your doctor or health care professional. Tobacco smoking increases the risk of getting a blood clot or having a stroke, especially if you are more than 69 years old. You are strongly advised not to smoke. If you wear contact lenses and notice visual changes, or if the lenses begin to feel uncomfortable, consult your eye care specialist. If you are going to have elective surgery, you may need to stop taking this medicine beforehand. Consult your health care professional for advice prior to scheduling the surgery. What side effects may I notice from receiving this medicine? Side effects that you should report to your doctor or health care professional as soon as possible: -allergic reactions like skin rash, itching or hives, swelling of the face, lips, or tongue -breast tissue changes or discharge -changes in vision -chest pain -confusion, trouble speaking or understanding -dark urine -general ill feeling or flu-like symptoms -light-colored stools -nausea, vomiting -pain, swelling, warmth in the leg -right upper belly pain -severe headaches -shortness of breath -sudden numbness or weakness of the face, arm or leg -trouble walking, dizziness, loss of balance or coordination -unusual vaginal bleeding -yellowing of the eyes or skin Side effects that usually do not require medical attention (report to your doctor or health care professional if they continue or are bothersome): -hair loss -increased hunger or thirst -increased urination -symptoms of vaginal infection like  itching, irritation or unusual discharge -unusually weak or tired This list may not describe all possible side effects. Call your doctor for medical advice about side effects. You may report side effects to FDA at 1-800-FDA-1088. Where should I keep my medicine? Keep out of the reach of children. Store at room temperature between 15 and 30 degrees C (59 and 86 degrees F). Throw away any unused medicine after the expiration date. NOTE: This sheet is a summary. It may not cover all possible information. If you have questions about this medicine, talk to your doctor, pharmacist, or health care provider.    2016, [employer]. (2010-04-08 09:20:36)

## 2015-09-12 ENCOUNTER — Telehealth: Payer: Self-pay | Admitting: Gynecologic Oncology

## 2015-09-12 NOTE — Telephone Encounter (Signed)
Patient called asking about premarin cream.  All questions answered.  Advised to call for any needs.

## 2015-10-30 DIAGNOSIS — L219 Seborrheic dermatitis, unspecified: Secondary | ICD-10-CM | POA: Diagnosis not present

## 2015-10-30 DIAGNOSIS — I10 Essential (primary) hypertension: Secondary | ICD-10-CM | POA: Diagnosis not present

## 2015-10-30 DIAGNOSIS — N183 Chronic kidney disease, stage 3 (moderate): Secondary | ICD-10-CM | POA: Diagnosis not present

## 2015-10-30 DIAGNOSIS — E119 Type 2 diabetes mellitus without complications: Secondary | ICD-10-CM | POA: Diagnosis not present

## 2015-10-30 DIAGNOSIS — E782 Mixed hyperlipidemia: Secondary | ICD-10-CM | POA: Diagnosis not present

## 2016-01-23 DIAGNOSIS — J209 Acute bronchitis, unspecified: Secondary | ICD-10-CM | POA: Diagnosis not present

## 2016-06-11 DIAGNOSIS — L219 Seborrheic dermatitis, unspecified: Secondary | ICD-10-CM | POA: Diagnosis not present

## 2016-06-11 DIAGNOSIS — I10 Essential (primary) hypertension: Secondary | ICD-10-CM | POA: Diagnosis not present

## 2016-06-11 DIAGNOSIS — M7050 Other bursitis of knee, unspecified knee: Secondary | ICD-10-CM | POA: Diagnosis not present

## 2016-06-11 DIAGNOSIS — E119 Type 2 diabetes mellitus without complications: Secondary | ICD-10-CM | POA: Diagnosis not present

## 2016-06-11 DIAGNOSIS — E782 Mixed hyperlipidemia: Secondary | ICD-10-CM | POA: Diagnosis not present

## 2016-07-26 DIAGNOSIS — R300 Dysuria: Secondary | ICD-10-CM | POA: Diagnosis not present

## 2016-07-26 DIAGNOSIS — L298 Other pruritus: Secondary | ICD-10-CM | POA: Diagnosis not present

## 2016-07-29 ENCOUNTER — Telehealth: Payer: Self-pay | Admitting: *Deleted

## 2016-07-29 NOTE — Telephone Encounter (Signed)
Patient called ans stated that "I need to see Dr. Alycia Rossetti, I thought I had a UTI. Went to my primary and they said I don't have a UTI. But something is going on and I need to get it checked out." Stated that "I do feel like I have to uriante a lot, but I also drink a lot of water. I'm having some buring and pain with urination. No bleeding." Per [name] APP patient scheduled for an app on July 17th.

## 2016-08-03 ENCOUNTER — Telehealth: Payer: Self-pay | Admitting: *Deleted

## 2016-08-03 ENCOUNTER — Ambulatory Visit: Payer: Medicare HMO | Attending: Gynecologic Oncology | Admitting: Gynecologic Oncology

## 2016-08-03 ENCOUNTER — Other Ambulatory Visit (HOSPITAL_BASED_OUTPATIENT_CLINIC_OR_DEPARTMENT_OTHER): Payer: Medicare HMO

## 2016-08-03 ENCOUNTER — Telehealth: Payer: Self-pay | Admitting: Gynecologic Oncology

## 2016-08-03 ENCOUNTER — Encounter: Payer: Self-pay | Admitting: Gynecologic Oncology

## 2016-08-03 VITALS — BP 138/58 | HR 70 | Temp 98.0°F | Resp 18 | Wt 177.2 lb

## 2016-08-03 DIAGNOSIS — R102 Pelvic and perineal pain: Secondary | ICD-10-CM | POA: Diagnosis not present

## 2016-08-03 DIAGNOSIS — Z91013 Allergy to seafood: Secondary | ICD-10-CM | POA: Insufficient documentation

## 2016-08-03 DIAGNOSIS — Z90710 Acquired absence of both cervix and uterus: Secondary | ICD-10-CM | POA: Diagnosis not present

## 2016-08-03 DIAGNOSIS — B373 Candidiasis of vulva and vagina: Secondary | ICD-10-CM | POA: Diagnosis not present

## 2016-08-03 DIAGNOSIS — R300 Dysuria: Secondary | ICD-10-CM | POA: Diagnosis not present

## 2016-08-03 DIAGNOSIS — I10 Essential (primary) hypertension: Secondary | ICD-10-CM | POA: Insufficient documentation

## 2016-08-03 DIAGNOSIS — Z9889 Other specified postprocedural states: Secondary | ICD-10-CM | POA: Diagnosis not present

## 2016-08-03 DIAGNOSIS — Z9049 Acquired absence of other specified parts of digestive tract: Secondary | ICD-10-CM | POA: Diagnosis not present

## 2016-08-03 DIAGNOSIS — B3731 Acute candidiasis of vulva and vagina: Secondary | ICD-10-CM

## 2016-08-03 DIAGNOSIS — Z880 Allergy status to penicillin: Secondary | ICD-10-CM | POA: Insufficient documentation

## 2016-08-03 DIAGNOSIS — Z7982 Long term (current) use of aspirin: Secondary | ICD-10-CM | POA: Insufficient documentation

## 2016-08-03 DIAGNOSIS — E119 Type 2 diabetes mellitus without complications: Secondary | ICD-10-CM | POA: Diagnosis not present

## 2016-08-03 DIAGNOSIS — K219 Gastro-esophageal reflux disease without esophagitis: Secondary | ICD-10-CM | POA: Diagnosis not present

## 2016-08-03 DIAGNOSIS — Z808 Family history of malignant neoplasm of other organs or systems: Secondary | ICD-10-CM | POA: Diagnosis not present

## 2016-08-03 DIAGNOSIS — Z8249 Family history of ischemic heart disease and other diseases of the circulatory system: Secondary | ICD-10-CM | POA: Diagnosis not present

## 2016-08-03 DIAGNOSIS — Z888 Allergy status to other drugs, medicaments and biological substances status: Secondary | ICD-10-CM | POA: Insufficient documentation

## 2016-08-03 DIAGNOSIS — R739 Hyperglycemia, unspecified: Secondary | ICD-10-CM | POA: Diagnosis not present

## 2016-08-03 DIAGNOSIS — Z8542 Personal history of malignant neoplasm of other parts of uterus: Secondary | ICD-10-CM | POA: Diagnosis not present

## 2016-08-03 LAB — URINALYSIS, MICROSCOPIC - CHCC
Bilirubin (Urine): NEGATIVE
GLUCOSE UR CHCC: 2000 mg/dL
KETONES: NEGATIVE mg/dL
Nitrite: NEGATIVE
PH: 6.5 (ref 4.6–8.0)
PROTEIN: NEGATIVE mg/dL
SPECIFIC GRAVITY, URINE: 1.01 (ref 1.003–1.035)
UROBILINOGEN UR: 0.2 mg/dL (ref 0.2–1)

## 2016-08-03 MED ORDER — MICONAZOLE NITRATE 2 % EX CREA: 1.0000 "application " | TOPICAL_CREAM | Freq: Two times a day (BID) | CUTANEOUS | 1 refills | Status: DC

## 2016-08-03 NOTE — Telephone Encounter (Signed)
Patient called and informed of urinalysis results.  Advised patient that the culture would take 2-3 days and she would like to wait for the culture before starting on antibiotics.  Informed her of the large amount of glucose in her urine.  Advised her of the severe complications of her uncontrolled diabetes including DKA, coma, death.  She states she will start on her salad diet today and get it down.  Advised her that she really needs to follow up with her PCP as soon as possible and she may need to start medication.  She states taking the medications was terrible, caused stomach upset.  She states she has trust issues with her PCP but she is unable to transfer to another physician in the practice.  Advised her that she needs to seek care as soon as possible for her uncontrolled diabetes.  Urine results faxed to Dr. Doreene Adas office and message left for him as well.  Reportable signs and symptoms reviewed.  Advised her she would be notified of her urine culture results.

## 2016-08-03 NOTE — Progress Notes (Signed)
Consult Note: Gyn-Onc  Meagan [name] 71 y.o. female  CC:  Chief Complaint  Patient presents with  . Pelvic discomfort in female    follow up  . Vulvar itching    HPI: Patient is a 71 year old, G1P1 who presented with vaginal bleeding since June 2013. She underwent a D&C with [name] which revealed a grade 1 endometrial carcinoma. On December 14, 2011, she underwent a total robotic hysterectomy bilateral salpingo-oophorectomy bilateral pelvic lymph node dissection. Operative findings included a globular uterus with a 6 cm anterior myoma in a 3 cm small pedunculated myoma. She had adhesive disease of the rectosigmoid colon to the left adnexa and the pelvic sidewall. Frozen section revealed no evidence of residual grade 1 endometrial adenocarcinoma.  Final pathology revealed: ENDOMETRIUM WITH SMALL FOCUS OF HIGHLY ATYPICAL ENDOMETRIAL GLANDS IN THE VICINITY OF PREVIOUS BIOPSY SITE / CURETTAGE CHANGES CONSISTENT WITH FOCAL ATYPICAL COMPLEX HYPERPLASIA, SEE COMMENT. - DEFINITIVE INVASIVE TUMOR OR TUMOR INVOLVING A LYMPH/VASCULAR SPACE IS NOT IDENTIFIED. - ADENOMYOSIS PRESENT. - BENIGN LEIOMYOMATA. - BENIGN CERVIX WITHOUT DYSPLASIA, ATYPIA OR MALIGNANCY IDENTIFIED. - BILATERAL OVARIES SHOW NO SIGNIFICANT PATHOLOGIC ABNORMALITIES. - BILATERAL FALLOPIAN TUBES SHOW NO SIGNIFICANT PATHOLOGIC ABNORMALITIES. 2. Lymph nodes, regional resection, left pelvic - SIX BENIGN LYMPH NODES WITH NO TUMOR SEEN (0/6). 3. Lymph nodes, regional resection, right pelvic - THREE BENIGN LYMPH NODES WITH NO TUMOR SEEN (0/3). Microscopic Comment 1. Deeper sections are cut and examined in the area where the foci of highly atypical glandular cells arepresent. Although definitive carcinoma is not present, the area of focal atypical complex hyperplasiaadjacent to previous biopsy site/curettage changes likely represents the area where the previous carcinomawas removed. Of note, all lymph nodes which are examined are  negative for metastatic carcinoma (seespecimen diagnosis for 2 and 3). The previous case ([id number]) is examined with the current case.Based on the previous case, the tumor is best staged as a pT1a, pN0, MX of FIGO stage IA tumor. BothDr. Nicoletta Dress and [name] have seen this case (in conjunction with the previous case) in consultation with agreement that the small focus of atypical endometrial glands (focal atypical complex hyperplasia) likely represents the area where the curettage removed the previously diagnosed tumor.  Interval History:  She presents today with complaints of discomfort after urination.  She states her symptoms began several weeks ago and she saw her PCP.  She states they performed an analysis on her urine and told her she was fine.  Her symptoms persisted so she contacted our office.  She states she was also experiencing itching and burning on her vulva.  She used monistat cream with little relief.  She states her blood sugars have been running high, with last check yesterday in the 300 s.  She does not take anything at this time and states if she eats only salads it normally improves.  She has been eating poorly and not exercising.  She states she eats more when under stress.  She reports increased stress about her son's divorce and states his ex-wife is turning her grandson against them.  She continues to complain of dyspareunia and states she never picked up the premarin cream prescribed by Dr. Alycia Rossetti. No relief reported with lubrication.  When asked about following up with her PCP, she states she has trust issues with that office and wanted to transfer to another doctor but was told she needs to stay with Dr. Kenton Kingfisher because he knows her best.  She does not want to take medication for her  diabetes and also states they did not order a urine culture at her last visit.    She continues to report: She's never had a mammogram. She states that she has never had one and refuses to schedule  one. She states she knows of women who have had had biopsies because of false positive mammograms. Similarly she refuses a colonoscopy. We discussed that they are both proven to make early diagnoses of cancers and saved lives. She still is not interested in pursuing either one of these screening methodologies.  Review of Systems  Constitutional: Feels stressed.  Denies fever, chills, early satiety, change in appetite.  Reporting increase in weight of 14 pounds. Skin: Positive for itching on the vulva.  Reporting a boil in the right groin and on the mons pubis.  No rash, sores, jaundice, or dryness.  Cardiovascular: No chest pain, shortness of breath, or edema  Pulmonary: No cough or wheeze.  Gastro Intestinal:  No nausea, vomiting, slight constipation. No bright red blood per rectum. Genitourinary: Reporting frequency, dysuria.  No urgency.  Denies vaginal bleeding and discharge.  Musculoskeletal: No joint pain reported. Neurologic: No weakness, numbness, or change in gait.  Psychology:  Stressed due to son's divorce and the impact on her grandson.  Current Meds:  Outpatient Encounter Prescriptions as of 08/03/2016  Medication Sig  . aspirin EC 81 MG tablet Take 81 mg by mouth daily.  Marland Kitchen doxazosin (CARDURA) 2 MG tablet Take 2 mg by mouth at bedtime.  . mometasone (ELOCON) 0.1 % lotion   . nystatin cream (MYCOSTATIN) Apply 1 application topically 2 (two) times daily. Apply to affected area BID for up to 7 days.  Marland Kitchen triamterene-hydrochlorothiazide (MAXZIDE-25) 37.5-25 MG per tablet Take 1 tablet by mouth at bedtime.   . miconazole (MICOTIN) 2 % cream Apply 1 application topically 2 (two) times daily.  . [DISCONTINUED] clobetasol (TEMOVATE) 0.05 % external solution    Facility-Administered Encounter Medications as of 08/03/2016  Medication  . conjugated estrogens (PREMARIN) vaginal cream 0.5 Applicatorful    Allergy:  Allergies  Allergen Reactions  . Penicillin G Anaphylaxis  . Doxycycline  Hyclate   . Shrimp [Shellfish Allergy] Nausea And Vomiting  . Sinutab Sinus Max St [Phenylephrine-Acetaminophen] Swelling  . Valacyclovir Other (See Comments)    Confusion    Social Hx:   Social History   Social History  . Marital status: Married    Spouse name: N/A  . Number of children: N/A  . Years of education: N/A   Occupational History  . Not on file.   Social History Main Topics  . Smoking status: Never Smoker  . Smokeless tobacco: Never Used  . Alcohol use No  . Drug use: No  . Sexual activity: Yes    Partners: Male    Birth control/ protection: None     Comment: husband had a vasectomy, pt with hysterectomy   Other Topics Concern  . Not on file   Social History Narrative  . No narrative on file    Past Surgical Hx:  Past Surgical History:  Procedure Laterality Date  . CHOLECYSTECTOMY    . DILATATION & CURRETTAGE/HYSTEROSCOPY WITH RESECTOCOPE  11/30/2011   Procedure: [place];  Surgeon: Peri Maris, MD;  Location: [facility];  Service: Gynecology;  Laterality: N/A;  . DILATION AND CURETTAGE OF UTERUS    . LYMPH NODE DISSECTION  12/14/2011   Procedure: LYMPH NODE DISSECTION;  Surgeon: Imagene Gurney A. Alycia Rossetti, MD;  Location: [facility];  Service: Gynecology;  Laterality: N/A;  . ROBOTIC ASSISTED TOTAL HYSTERECTOMY WITH BILATERAL SALPINGO OOPHERECTOMY  12/14/2011   Procedure: ROBOTIC ASSISTED TOTAL HYSTERECTOMY WITH BILATERAL SALPINGO OOPHORECTOMY;  Surgeon: Imagene Gurney A. Alycia Rossetti, MD;  Location: [facility];  Service: Gynecology;  Laterality: N/A;  . WISDOM TOOTH EXTRACTION      Past Medical Hx:  Past Medical History:  Diagnosis Date  . Anemia    hx   . Anxiety    no meds  . Arthritis    knees  . Cancer [facility])    endometrial cancer  . Diabetes mellitus without complication ([place])    on oral medication  . GERD (gastroesophageal reflux disease)    tx zantac  . H/O hiatal hernia   . Hypertension   . SVD (spontaneous vaginal  delivery)    x 1  . UTI (lower urinary tract infection) 11/22/11   started abx on 11/24/11    Family Hx:  Family History  Problem Relation Age of Onset  . Skin cancer Sister   . Cancer Paternal Aunt   . Heart disease Mother   . Heart disease Father     Vitals:  Blood pressure (!) 138/58, pulse 70, temperature 98 F (36.7 C), resp. rate 18, weight 177 lb 3.2 oz (80.4 kg), last menstrual period 01/18/2001, SpO2 99 %.  Physical Exam: General: Well developed, well nourished female in no acute distress. Alert and oriented x 3.  Cardiovascular: Regular rate and rhythm. S1 and S2 normal.  Lungs: Clear to auscultation bilaterally. No wheezes/crackles/rhonchi noted.  Skin: Healing skin lesion in the right groin (boil per pt), another noted on the right lateral mons with pustule present.  No surrounding erythema.   Back: No CVA tenderness.  Abdomen: Abdomen soft, non-tender and obese. Active bowel sounds in all quadrants. No evidence of a fluid wave or abdominal masses but slightly limited due to habitus.  Genitourinary:    Vulva/vagina: Normal external female genitalia. No lesions. Candida noted between the labia.     Urethra: No lesions or masses.    Vagina: Atrophic without any lesions. No palpable masses or nodularity. Vaginal cuff intact with no visible lesions.  No vaginal bleeding or drainage noted.  Rectal: Pt refused. Extremities: Mild, non pitting edema bilaterally.  No bilateral cyanosis or clubbing.   Assessment/Plan: 71 year old with a stage IA grade 1 endometrial carcinoma. She has no evidence of recurrent disease on examination today.  Urine sent for analysis and culture.  Advised patient of importance of improving her glycemic control and following up with her PCP.  500 mg/dL noted on urinalysis report from [place].  We will contact her with the results.  Advised her that is urine culture negative, we will discuss possible referral to urology.  She would like to continue to  hold off starting the premarin vaginal cream until her symptoms improve.  Script sent in for topical miconazole cream for the vulvar symptoms.  Discussed the effects of having elevated blood glucose levels with her current symptoms.  She verbalizes understanding.  She is advised to call for any questions or concerns.  Follow up plan will be discussed when culture results available.  [name], Meagan Rife, MD 08/03/2016, 4:20 PM

## 2016-08-03 NOTE — Telephone Encounter (Signed)
Called Dr. Shirline Frees office with Meagan [name] and requested the last office note/labs to be faxed to our office.

## 2016-08-03 NOTE — Patient Instructions (Signed)
We will send the urine sample for analysis and culture and let you know the results.  We will also send in a anti-yeast cream to your pharmacy to use on your vulva for itching symptoms.  Trying to work to improve your blood sugars will also help with your symptoms of itching/yeast.  Please call for any questions or concerns.

## 2016-08-04 ENCOUNTER — Telehealth: Payer: Self-pay

## 2016-08-04 NOTE — Telephone Encounter (Signed)
Told Meagan [name] that the cream [name] sent to the pharmacy is  not covered by insurance.  It would cost her $ 52.00 for that cream.  The equivalent cream in the [facility] brand OTC is $9.37.  She can have them fill this if she would like. Pt verbalized understanding.

## 2016-08-06 ENCOUNTER — Telehealth: Payer: Self-pay | Admitting: Gynecologic Oncology

## 2016-08-06 DIAGNOSIS — N3001 Acute cystitis with hematuria: Secondary | ICD-10-CM

## 2016-08-06 LAB — URINE CULTURE

## 2016-08-06 MED ORDER — SULFAMETHOXAZOLE-TRIMETHOPRIM 800-160 MG PO TABS: 1.0000 | ORAL_TABLET | Freq: Two times a day (BID) | ORAL | 0 refills | Status: DC

## 2016-08-06 NOTE — Telephone Encounter (Signed)
Informed patient that she does have a UTI but we are waiting for the sensitivities.  She states her urinary symptoms are "not too bad, tolerable."  She states her blood sugars are improving; from 400s to 200s.  Advised her that I would reach out to the lab to see when the sensitivities would be available.  Spoke with lab rep.  She states to give another 1-2 days for sensitivities to result.    Called patient back and informed her that we will go ahead and start Bactrim twice daily for 5 days. No concerns voiced.  Advised to call for any needs or concerns.  Informed her that we would let her know if she needed to change antibiotics.

## 2016-08-09 ENCOUNTER — Telehealth: Payer: Self-pay | Admitting: Gynecologic Oncology

## 2016-08-09 NOTE — Telephone Encounter (Signed)
Called patient to check on current status and inform her of urine culture results.  Left message.  Advised her to please call the office for any questions or concerns.

## 2016-09-29 DIAGNOSIS — L309 Dermatitis, unspecified: Secondary | ICD-10-CM | POA: Diagnosis not present

## 2016-09-29 DIAGNOSIS — E1165 Type 2 diabetes mellitus with hyperglycemia: Secondary | ICD-10-CM | POA: Diagnosis not present

## 2016-09-29 DIAGNOSIS — I10 Essential (primary) hypertension: Secondary | ICD-10-CM | POA: Diagnosis not present

## 2016-09-29 DIAGNOSIS — Z1389 Encounter for screening for other disorder: Secondary | ICD-10-CM | POA: Diagnosis not present

## 2016-09-29 DIAGNOSIS — E782 Mixed hyperlipidemia: Secondary | ICD-10-CM | POA: Diagnosis not present

## 2016-10-20 DIAGNOSIS — S0502XA Injury of conjunctiva and corneal abrasion without foreign body, left eye, initial encounter: Secondary | ICD-10-CM | POA: Diagnosis not present

## 2016-10-21 DIAGNOSIS — S0502XD Injury of conjunctiva and corneal abrasion without foreign body, left eye, subsequent encounter: Secondary | ICD-10-CM | POA: Diagnosis not present

## 2016-10-22 DIAGNOSIS — S0502XD Injury of conjunctiva and corneal abrasion without foreign body, left eye, subsequent encounter: Secondary | ICD-10-CM | POA: Diagnosis not present

## 2017-01-10 DIAGNOSIS — K120 Recurrent oral aphthae: Secondary | ICD-10-CM | POA: Diagnosis not present

## 2017-02-21 DIAGNOSIS — E782 Mixed hyperlipidemia: Secondary | ICD-10-CM | POA: Diagnosis not present

## 2017-02-21 DIAGNOSIS — L219 Seborrheic dermatitis, unspecified: Secondary | ICD-10-CM | POA: Diagnosis not present

## 2017-02-21 DIAGNOSIS — E119 Type 2 diabetes mellitus without complications: Secondary | ICD-10-CM | POA: Diagnosis not present

## 2017-02-21 DIAGNOSIS — K219 Gastro-esophageal reflux disease without esophagitis: Secondary | ICD-10-CM | POA: Diagnosis not present

## 2017-02-21 DIAGNOSIS — I10 Essential (primary) hypertension: Secondary | ICD-10-CM | POA: Diagnosis not present

## 2017-03-24 DIAGNOSIS — J209 Acute bronchitis, unspecified: Secondary | ICD-10-CM | POA: Diagnosis not present

## 2017-05-22 DIAGNOSIS — L0201 Cutaneous abscess of face: Secondary | ICD-10-CM | POA: Diagnosis not present

## 2017-06-09 ENCOUNTER — Inpatient Hospital Stay: Payer: Medicare HMO | Attending: Obstetrics | Admitting: Obstetrics

## 2017-06-09 ENCOUNTER — Encounter: Payer: Self-pay | Admitting: Obstetrics

## 2017-06-09 VITALS — BP 138/67 | HR 110 | Temp 98.2°F | Resp 18 | Ht 62.68 in | Wt 171.8 lb

## 2017-06-09 DIAGNOSIS — C541 Malignant neoplasm of endometrium: Secondary | ICD-10-CM

## 2017-06-09 DIAGNOSIS — Z90722 Acquired absence of ovaries, bilateral: Secondary | ICD-10-CM | POA: Diagnosis not present

## 2017-06-09 DIAGNOSIS — B3731 Acute candidiasis of vulva and vagina: Secondary | ICD-10-CM

## 2017-06-09 DIAGNOSIS — Z90710 Acquired absence of both cervix and uterus: Secondary | ICD-10-CM | POA: Diagnosis not present

## 2017-06-09 DIAGNOSIS — B373 Candidiasis of vulva and vagina: Secondary | ICD-10-CM

## 2017-06-09 MED ORDER — MICONAZOLE NITRATE 2 % EX CREA: 1.0000 "application " | TOPICAL_CREAM | Freq: Two times a day (BID) | CUTANEOUS | 1 refills | Status: DC

## 2017-06-09 MED ORDER — FLUCONAZOLE 150 MG PO TABS: 150.0000 mg | ORAL_TABLET | Freq: Every day | ORAL | 1 refills | Status: AC

## 2017-06-09 NOTE — Patient Instructions (Addendum)
1. For your external yeast infection Diflucan will be ordered. 2. For the "boil" recommend Sitz baths daily and if no improvement or worsening followup with your PCP. 3. We will try to get you referred to a local Gynecologist for continued well woman care. 4. 1 year followup with Meagan [name] should we not be able to get you in with Gynecology.

## 2017-06-13 ENCOUNTER — Encounter: Payer: Self-pay | Admitting: Obstetrics

## 2017-06-13 NOTE — Progress Notes (Signed)
Progress note: Gyn-Onc follow-up  Meagan [name] 72 y.o. female  CC:  Chief Complaint  Patient presents with  . Endometrial adenocarcinoma [facility])    HPI: Patient is a 72 year old, G1P1 who presented with vaginal bleeding since June 2013. She underwent a D&C with [name] which revealed a grade 1 endometrial carcinoma. On December 14, 2011, she underwent a total robotic hysterectomy bilateral salpingo-oophorectomy bilateral pelvic lymph node dissection. Operative findings included a globular uterus with a 6 cm anterior myoma in a 3 cm small pedunculated myoma. She had adhesive disease of the rectosigmoid colon to the left adnexa and the pelvic sidewall. Frozen section revealed no evidence of residual grade 1 endometrial adenocarcinoma.  Final pathology revealed no residual disease in the uterus ENDOMETRIUM WITH SMALL FOCUS OF HIGHLY ATYPICAL ENDOMETRIAL GLANDS IN THE VICINITY OF PREVIOUS BIOPSY SITE / CURETTAGE CHANGES CONSISTENT WITH FOCAL ATYPICAL COMPLEX HYPERPLASIA, SEE COMMENT. - DEFINITIVE INVASIVE TUMOR OR TUMOR INVOLVING A LYMPH/VASCULAR SPACE IS NOT IDENTIFIED. - ADENOMYOSIS PRESENT. - BENIGN LEIOMYOMATA. - BENIGN CERVIX WITHOUT DYSPLASIA, ATYPIA OR MALIGNANCY IDENTIFIED. - BILATERAL OVARIES SHOW NO SIGNIFICANT PATHOLOGIC ABNORMALITIES. - BILATERAL FALLOPIAN TUBES SHOW NO SIGNIFICANT PATHOLOGIC ABNORMALITIES. 2. Lymph nodes, regional resection, left pelvic - SIX BENIGN LYMPH NODES WITH NO TUMOR SEEN (0/6). 3. Lymph nodes, regional resection, right pelvic - THREE BENIGN LYMPH NODES WITH NO TUMOR SEEN (0/3). Microscopic Comment 1. Deeper sections are cut and examined in the area where the foci of highly atypical glandular cells arepresent. Although definitive carcinoma is not present, the area of focal atypical complex hyperplasiaadjacent to previous biopsy site/curettage changes likely represents the area where the previous carcinomawas removed. Of note, all lymph nodes which  are examined are negative for metastatic carcinoma (seespecimen diagnosis for 2 and 3). The previous case ([id number]) is examined with the current case.Based on the previous case, the tumor is best staged as a pT1a, pN0, MX of FIGO stage IA tumor. BothDr. Nicoletta Dress and [name] have seen this case (in conjunction with the previous case) in consultation with agreement that the small focus of atypical endometrial glands (focal atypical complex hyperplasia) likely represents the area where the curettage removed the previously diagnosed tumor.  Interval History:  Though she is an established patient she is new to me and I confirmed with her that she is greater than 5 years from her initial diagnosis.  Her main concerns today are some pruritus and she would like to have treatment for which she thinks is a yeast infection.  She is not comfortable undergoing pelvic examination with her primary care and she does not wish to have exams with the referring gynecologist from her initial diagnosis. She also notes to me just prior to examination that she has a "boil" that she would like me to look at  Specific to her history of uterine cancer she denies pelvic pain and denies bleeding.  She was last seen by Lenna Sciara cross her nurse practitioner for routine care July 2018  By report in the record she has some issues with her diabetes control  Current Meds:  Outpatient Encounter Medications as of 06/09/2017  Medication Sig  . aspirin EC 81 MG tablet Take 81 mg by mouth daily.  Marland Kitchen doxazosin (CARDURA) 2 MG tablet Take 2 mg by mouth at bedtime.  . mometasone (ELOCON) 0.1 % lotion   . triamterene-hydrochlorothiazide (MAXZIDE-25) 37.5-25 MG per tablet Take 1 tablet by mouth at bedtime.   . [EXPIRED] fluconazole (DIFLUCAN) 150 MG tablet Take 1 tablet (150  mg total) by mouth daily for 2 doses.  . miconazole (MICOTIN) 2 % cream Apply 1 application topically 2 (two) times daily. To the vulva  . [DISCONTINUED] miconazole  (MICOTIN) 2 % cream Apply 1 application topically 2 (two) times daily. (Patient not taking: Reported on 06/09/2017)  . [DISCONTINUED] nystatin cream (MYCOSTATIN) Apply 1 application topically 2 (two) times daily. Apply to affected area BID for up to 7 days. (Patient not taking: Reported on 06/09/2017)  . [DISCONTINUED] sulfamethoxazole-trimethoprim (BACTRIM DS,SEPTRA DS) 800-160 MG tablet Take 1 tablet by mouth 2 (two) times daily. (Patient not taking: Reported on 06/09/2017)   Facility-Administered Encounter Medications as of 06/09/2017  Medication  . conjugated estrogens (PREMARIN) vaginal cream 0.5 Applicatorful    Allergy:  Allergies  Allergen Reactions  . Penicillin G Anaphylaxis  . Doxycycline Hyclate   . Shrimp [Shellfish Allergy] Nausea And Vomiting  . Sinutab Sinus Max St [Phenylephrine-Acetaminophen] Swelling  . Valacyclovir Other (See Comments)    Confusion    Social Hx:   Social History   Socioeconomic History  . Marital status: Married    Spouse name: Not on file  . Number of children: Not on file  . Years of education: Not on file  . Highest education level: Not on file  Occupational History  . Not on file  Social Needs  . Financial resource strain: Not on file  . Food insecurity:    Worry: Not on file    Inability: Not on file  . Transportation needs:    Medical: Not on file    Non-medical: Not on file  Tobacco Use  . Smoking status: Never Smoker  . Smokeless tobacco: Never Used  Substance and Sexual Activity  . Alcohol use: No    Alcohol/week: 0.0 oz  . Drug use: No  . Sexual activity: Yes    Partners: Male    Birth control/protection: None    Comment: husband had a vasectomy, pt with hysterectomy  Lifestyle  . Physical activity:    Days per week: Not on file    Minutes per session: Not on file  . Stress: Not on file  Relationships  . Social connections:    Talks on phone: Not on file    Gets together: Not on file    Attends religious service:  Not on file    Active member of club or organization: Not on file    Attends meetings of clubs or organizations: Not on file    Relationship status: Not on file  . Intimate partner violence:    Fear of current or ex partner: Not on file    Emotionally abused: Not on file    Physically abused: Not on file    Forced sexual activity: Not on file  Other Topics Concern  . Not on file  Social History Narrative  . Not on file    Past Surgical Hx:  Past Surgical History:  Procedure Laterality Date  . CHOLECYSTECTOMY    . DILATATION & CURRETTAGE/HYSTEROSCOPY WITH RESECTOCOPE  11/30/2011   Procedure: [place];  Surgeon: Peri Maris, MD;  Location: [facility];  Service: Gynecology;  Laterality: N/A;  . DILATION AND CURETTAGE OF UTERUS    . LYMPH NODE DISSECTION  12/14/2011   Procedure: LYMPH NODE DISSECTION;  Surgeon: Imagene Gurney A. Alycia Rossetti, MD;  Location: [facility];  Service: Gynecology;  Laterality: N/A;  . ROBOTIC ASSISTED TOTAL HYSTERECTOMY WITH BILATERAL SALPINGO OOPHERECTOMY  12/14/2011   Procedure: ROBOTIC ASSISTED TOTAL HYSTERECTOMY  WITH BILATERAL SALPINGO OOPHORECTOMY;  Surgeon: Imagene Gurney A. Alycia Rossetti, MD;  Location: [facility];  Service: Gynecology;  Laterality: N/A;  . WISDOM TOOTH EXTRACTION      Past Medical Hx:  Past Medical History:  Diagnosis Date  . Anemia    hx   . Anxiety    no meds  . Arthritis    knees  . Cancer [facility])    endometrial cancer  . Diabetes mellitus without complication ([place])    on oral medication  . GERD (gastroesophageal reflux disease)    tx zantac  . H/O hiatal hernia   . Hypertension   . SVD (spontaneous vaginal delivery)    x 1  . UTI (lower urinary tract infection) 11/22/11   started abx on 11/24/11    Family Hx:  Family History  Problem Relation Age of Onset  . Skin cancer Sister   . Cancer Paternal Aunt   . Heart disease Mother   . Heart disease Father     Review of Systems  HENT:   Positive for hearing  loss.   All other systems reviewed and are negative. She notes vaginal itching as in the HPI and "boil"   Vitals:  Blood pressure 138/67, pulse (!) 110, temperature 98.2 F (36.8 C), temperature source Oral, resp. rate 18, height 5' 2.68" (1.592 m), weight 171 lb 12.8 oz (77.9 kg), last menstrual period 01/18/2001, SpO2 100 %.  Physical Exam: ECOG PERFORMANCE STATUS: 1 - Symptomatic but completely ambulatory   General :  Well developed, 71 y.o., female in no apparent distress HEENT:  Normocephalic/atraumatic, symmetric, EOMI, eyelids normal Neck:   Supple, no masses.  Lymphatics:  No cervical/ submandibular/ supraclavicular/ infraclavicular/ inguinal adenopathy Respiratory:  Respirations unlabored, no use of accessory muscles CV:   Deferred Breast:  Deferred Musculoskeletal: No CVA tenderness, normal muscle strength. Abdomen:  Overweight soft, non-tender and nondistended. No evidence of hernia. No masses. Extremities:  No lymphedema, no erythema, non-tender. Skin:   Normal inspection Neuro/Psych:  No focal motor deficit, no abnormal mental status. Normal gait. Normal affect. Alert and oriented to person, place, and time  Genito Urinary: Vulva: She does have what appears to be dermal yeast/atrophy Bladder/urethra: Urethral meatus normal in size and location. No lesions or   masses, well supported bladder Speculum exam: Vagina: No lesion, no discharge, no bleeding. Cervix: Surgically absent Bimanual exam:  Uterus: Surgically absent  Adnexa: No masses. Rectovaginal:  Good tone, no masses, no cul de sac nodularity, no parametrial involvement or nodularity.   Assessment/Plan: 72 year old with a stage IA grade 1 endometrial carcinoma.  I explained to her today that she is very low risk for recurrence  Given she is over 5 years out from her treatment/diagnosis I recommend disposition to a provider that we will do annual pelvic exams.  I encouraged her to follow-up with a new  gynecologist if she does not want her primary care provider to be the one performing those exams.  As far as her boil is concerned I encouraged her to use sitz baths.  She is to report to [place] if this area worsens she may need to have antibiotics should that happen.  If she is planning to continue care here we will have her follow with the nurse practitioner for annual visits.  She does not need to be placed into the MD office visit unless abnormal findings are found.  Given the probable dermal yeast I did prescribe her Diflucan but explained that her diabetes is contributing to  the diagnosis.  She states she has not had a yeast infection for quite some time so she does not believe that the diabetes is affecting this.  Isabel Caprice, MD 06/13/2017, 3:08 PM

## 2017-06-14 DIAGNOSIS — E1165 Type 2 diabetes mellitus with hyperglycemia: Secondary | ICD-10-CM | POA: Diagnosis not present

## 2017-06-14 DIAGNOSIS — N183 Chronic kidney disease, stage 3 (moderate): Secondary | ICD-10-CM | POA: Diagnosis not present

## 2017-06-14 DIAGNOSIS — E782 Mixed hyperlipidemia: Secondary | ICD-10-CM | POA: Diagnosis not present

## 2017-06-14 DIAGNOSIS — K219 Gastro-esophageal reflux disease without esophagitis: Secondary | ICD-10-CM | POA: Diagnosis not present

## 2017-06-14 DIAGNOSIS — I10 Essential (primary) hypertension: Secondary | ICD-10-CM | POA: Diagnosis not present

## 2017-06-14 DIAGNOSIS — L219 Seborrheic dermatitis, unspecified: Secondary | ICD-10-CM | POA: Diagnosis not present

## 2017-06-14 DIAGNOSIS — L819 Disorder of pigmentation, unspecified: Secondary | ICD-10-CM | POA: Diagnosis not present

## 2017-06-17 ENCOUNTER — Telehealth: Payer: Self-pay | Admitting: *Deleted

## 2017-06-17 NOTE — Telephone Encounter (Signed)
Called and faxed records to [facility] with Dr. Radene Knee, for the patient to have a new regular GYN doctor. Patient declined the appt. Called the office back and cancelled the appt.

## 2017-06-21 ENCOUNTER — Telehealth: Payer: Self-pay | Admitting: *Deleted

## 2017-06-21 NOTE — Telephone Encounter (Signed)
Called and gave the patient an appt at [place] with Dr. Benjie Karvonen on July 11th at [place], arrive at 9:45am. Faxed records to the office.

## 2017-07-08 DIAGNOSIS — D485 Neoplasm of uncertain behavior of skin: Secondary | ICD-10-CM | POA: Diagnosis not present

## 2017-07-08 DIAGNOSIS — L820 Inflamed seborrheic keratosis: Secondary | ICD-10-CM | POA: Diagnosis not present

## 2017-07-18 DIAGNOSIS — J069 Acute upper respiratory infection, unspecified: Secondary | ICD-10-CM | POA: Diagnosis not present

## 2017-09-15 DIAGNOSIS — L219 Seborrheic dermatitis, unspecified: Secondary | ICD-10-CM | POA: Diagnosis not present

## 2017-09-15 DIAGNOSIS — E782 Mixed hyperlipidemia: Secondary | ICD-10-CM | POA: Diagnosis not present

## 2017-09-15 DIAGNOSIS — E1165 Type 2 diabetes mellitus with hyperglycemia: Secondary | ICD-10-CM | POA: Diagnosis not present

## 2017-09-15 DIAGNOSIS — I10 Essential (primary) hypertension: Secondary | ICD-10-CM | POA: Diagnosis not present

## 2017-09-15 DIAGNOSIS — N183 Chronic kidney disease, stage 3 (moderate): Secondary | ICD-10-CM | POA: Diagnosis not present

## 2017-09-15 DIAGNOSIS — K219 Gastro-esophageal reflux disease without esophagitis: Secondary | ICD-10-CM | POA: Diagnosis not present

## 2017-12-20 DIAGNOSIS — Z7984 Long term (current) use of oral hypoglycemic drugs: Secondary | ICD-10-CM | POA: Diagnosis not present

## 2017-12-20 DIAGNOSIS — E1165 Type 2 diabetes mellitus with hyperglycemia: Secondary | ICD-10-CM | POA: Diagnosis not present

## 2017-12-20 DIAGNOSIS — K219 Gastro-esophageal reflux disease without esophagitis: Secondary | ICD-10-CM | POA: Diagnosis not present

## 2017-12-20 DIAGNOSIS — E782 Mixed hyperlipidemia: Secondary | ICD-10-CM | POA: Diagnosis not present

## 2017-12-20 DIAGNOSIS — I10 Essential (primary) hypertension: Secondary | ICD-10-CM | POA: Diagnosis not present

## 2018-02-21 ENCOUNTER — Telehealth: Payer: Self-pay

## 2018-02-21 ENCOUNTER — Inpatient Hospital Stay: Payer: Medicare HMO | Attending: Obstetrics

## 2018-02-21 DIAGNOSIS — C541 Malignant neoplasm of endometrium: Secondary | ICD-10-CM | POA: Diagnosis not present

## 2018-02-21 DIAGNOSIS — R300 Dysuria: Secondary | ICD-10-CM | POA: Diagnosis not present

## 2018-02-21 NOTE — Telephone Encounter (Signed)
Returned pt's call regarding she is having pain with urination and had a fever of 101 yesterday, then took tylenol was 99 and today her temp was 99.  Denies any flank pain, respiratory symptoms, and denies any exposure to the flu.  Encouraged pt to see her PCP but pt reports she prefers Meagan [name].  Notified Meagan [name].  Order for lab appt, u/a, and urine culture. Appt for lab at 11:30 am today.  I let her know we will call her with results to determine need for antibiotic.  Pt voiced understanding. No other needs per pt at this time.

## 2018-02-22 ENCOUNTER — Telehealth: Payer: Self-pay

## 2018-02-22 NOTE — Telephone Encounter (Signed)
Told Ms Corine Shelter that there was not enough urine to do a urinalysis yesterday. A urine culture was sent.  The is  can take ~3 days to result. Ms Robideau states that she is feeling better today. Temp 99 today. Some discomfort with urination. Told Ms Sedlacek that she need to push water. Eight ounces every 1-1/2 hrours while awake to help flush bladder and stay hydrated. Will call patient with the results of the culture when available. Pt verbalized understanding.

## 2018-02-24 ENCOUNTER — Telehealth: Payer: Self-pay

## 2018-02-24 DIAGNOSIS — N390 Urinary tract infection, site not specified: Secondary | ICD-10-CM

## 2018-02-24 LAB — URINE CULTURE

## 2018-02-24 MED ORDER — NITROFURANTOIN MONOHYD MACRO 100 MG PO CAPS: 100.0000 mg | ORAL_CAPSULE | Freq: Two times a day (BID) | ORAL | 0 refills | Status: DC

## 2018-02-24 NOTE — Telephone Encounter (Signed)
Outgoing call to pt regarding results of u/a culture per Joylene John NP.  Order per Joylene John NP Macrobid 100 mg twice a day for 5 days.  And encouraged to begin soon and drink plenty of water.  Pt voiced understanding. Pt denies any fever.  Told her to call for any worsening symptoms or if antibiotic not helping.  Updated pharmacy per pt to [facility] in [place].  No other needs per pt at this time.

## 2018-02-24 NOTE — Telephone Encounter (Signed)
Spoke to [place] MDs office and got fax number to fax urine culture to their office for Dr Azalia Bilis- Attn Baxter Flattery.  Per Joylene John NP to let pt's PCP be aware of culture results.

## 2018-03-23 DIAGNOSIS — Z0000 Encounter for general adult medical examination without abnormal findings: Secondary | ICD-10-CM | POA: Diagnosis not present

## 2018-03-23 DIAGNOSIS — L219 Seborrheic dermatitis, unspecified: Secondary | ICD-10-CM | POA: Diagnosis not present

## 2018-03-23 DIAGNOSIS — E1165 Type 2 diabetes mellitus with hyperglycemia: Secondary | ICD-10-CM | POA: Diagnosis not present

## 2018-03-23 DIAGNOSIS — M79601 Pain in right arm: Secondary | ICD-10-CM | POA: Diagnosis not present

## 2018-03-23 DIAGNOSIS — E782 Mixed hyperlipidemia: Secondary | ICD-10-CM | POA: Diagnosis not present

## 2018-03-23 DIAGNOSIS — Z7984 Long term (current) use of oral hypoglycemic drugs: Secondary | ICD-10-CM | POA: Diagnosis not present

## 2018-03-23 DIAGNOSIS — I10 Essential (primary) hypertension: Secondary | ICD-10-CM | POA: Diagnosis not present

## 2018-07-07 DIAGNOSIS — Z7984 Long term (current) use of oral hypoglycemic drugs: Secondary | ICD-10-CM | POA: Diagnosis not present

## 2018-07-07 DIAGNOSIS — E1165 Type 2 diabetes mellitus with hyperglycemia: Secondary | ICD-10-CM | POA: Diagnosis not present

## 2018-07-07 DIAGNOSIS — I10 Essential (primary) hypertension: Secondary | ICD-10-CM | POA: Diagnosis not present

## 2018-07-07 DIAGNOSIS — K219 Gastro-esophageal reflux disease without esophagitis: Secondary | ICD-10-CM | POA: Diagnosis not present

## 2018-07-07 DIAGNOSIS — E782 Mixed hyperlipidemia: Secondary | ICD-10-CM | POA: Diagnosis not present

## 2018-07-07 DIAGNOSIS — L219 Seborrheic dermatitis, unspecified: Secondary | ICD-10-CM | POA: Diagnosis not present

## 2018-09-20 DIAGNOSIS — L089 Local infection of the skin and subcutaneous tissue, unspecified: Secondary | ICD-10-CM | POA: Diagnosis not present

## 2018-09-20 DIAGNOSIS — Z6829 Body mass index (BMI) 29.0-29.9, adult: Secondary | ICD-10-CM | POA: Diagnosis not present

## 2018-09-20 DIAGNOSIS — L239 Allergic contact dermatitis, unspecified cause: Secondary | ICD-10-CM | POA: Diagnosis not present

## 2018-09-20 DIAGNOSIS — N3000 Acute cystitis without hematuria: Secondary | ICD-10-CM | POA: Diagnosis not present

## 2018-11-17 DIAGNOSIS — Z7984 Long term (current) use of oral hypoglycemic drugs: Secondary | ICD-10-CM | POA: Diagnosis not present

## 2018-11-17 DIAGNOSIS — E782 Mixed hyperlipidemia: Secondary | ICD-10-CM | POA: Diagnosis not present

## 2018-11-17 DIAGNOSIS — I10 Essential (primary) hypertension: Secondary | ICD-10-CM | POA: Diagnosis not present

## 2018-11-17 DIAGNOSIS — E1165 Type 2 diabetes mellitus with hyperglycemia: Secondary | ICD-10-CM | POA: Diagnosis not present

## 2018-12-26 DIAGNOSIS — L03012 Cellulitis of left finger: Secondary | ICD-10-CM | POA: Diagnosis not present

## 2019-01-08 DIAGNOSIS — L03012 Cellulitis of left finger: Secondary | ICD-10-CM | POA: Diagnosis not present

## 2019-05-31 DIAGNOSIS — M79605 Pain in left leg: Secondary | ICD-10-CM | POA: Diagnosis not present

## 2019-05-31 DIAGNOSIS — N3000 Acute cystitis without hematuria: Secondary | ICD-10-CM | POA: Diagnosis not present

## 2019-05-31 DIAGNOSIS — N1831 Chronic kidney disease, stage 3a: Secondary | ICD-10-CM | POA: Diagnosis not present

## 2019-06-29 DIAGNOSIS — M79605 Pain in left leg: Secondary | ICD-10-CM | POA: Diagnosis not present

## 2019-06-29 DIAGNOSIS — N1831 Chronic kidney disease, stage 3a: Secondary | ICD-10-CM | POA: Diagnosis not present

## 2019-06-29 DIAGNOSIS — E1165 Type 2 diabetes mellitus with hyperglycemia: Secondary | ICD-10-CM | POA: Diagnosis not present

## 2019-06-29 DIAGNOSIS — I10 Essential (primary) hypertension: Secondary | ICD-10-CM | POA: Diagnosis not present

## 2019-06-29 DIAGNOSIS — N3001 Acute cystitis with hematuria: Secondary | ICD-10-CM | POA: Diagnosis not present

## 2019-06-29 DIAGNOSIS — R300 Dysuria: Secondary | ICD-10-CM | POA: Diagnosis not present

## 2019-06-29 DIAGNOSIS — Z7984 Long term (current) use of oral hypoglycemic drugs: Secondary | ICD-10-CM | POA: Diagnosis not present

## 2019-07-02 ENCOUNTER — Other Ambulatory Visit: Payer: Self-pay | Admitting: Family Medicine

## 2019-07-02 DIAGNOSIS — M79605 Pain in left leg: Secondary | ICD-10-CM

## 2019-08-02 ENCOUNTER — Other Ambulatory Visit: Payer: Self-pay

## 2019-08-02 ENCOUNTER — Ambulatory Visit
Admission: RE | Admit: 2019-08-02 | Discharge: 2019-08-02 | Disposition: A | Discharge status: Home / Self Care | Payer: Medicare HMO | Source: Ambulatory Visit | Attending: Family Medicine | Admitting: Family Medicine

## 2019-08-02 DIAGNOSIS — M79662 Pain in left lower leg: Secondary | ICD-10-CM | POA: Diagnosis not present

## 2019-08-02 DIAGNOSIS — M79605 Pain in left leg: Secondary | ICD-10-CM | POA: Diagnosis not present

## 2019-08-02 IMAGING — US US EXTREM LOW VENOUS*L*
1 series · 13 of 24 positions shown · non-contrast
Comparison: None.

CLINICAL DATA: Left lower extremity pain.  Evaluate for DVT.



[Series 1: us extrem low venous*left* · 0.08mm/px · 13 of 35 slices shown]
[im 1/35]
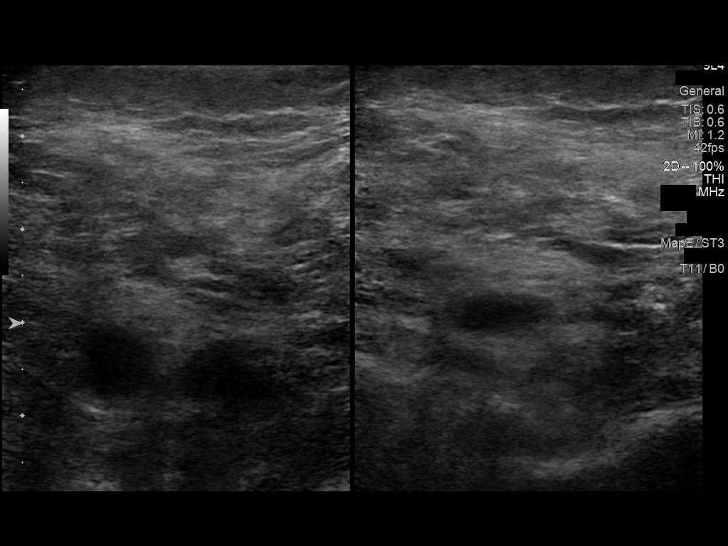
[im 3/35]
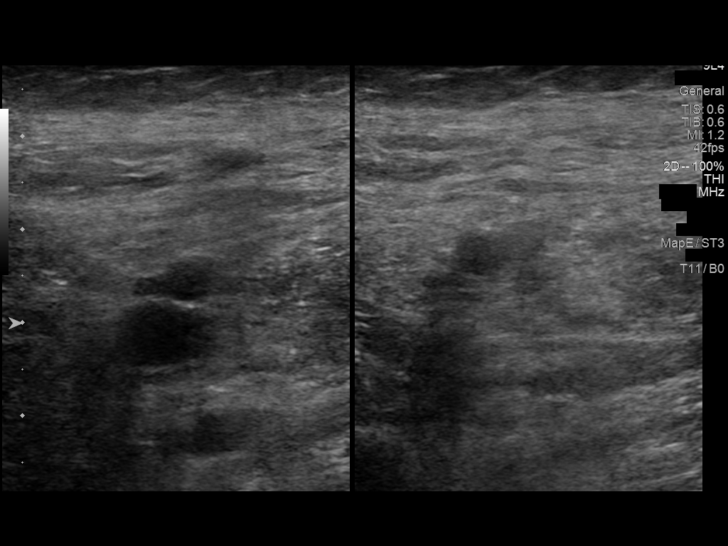
[im 6/35]
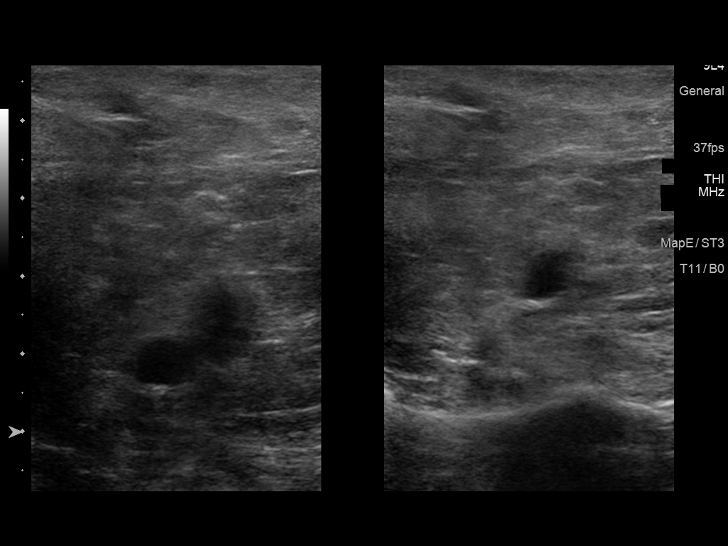
[im 9/35]
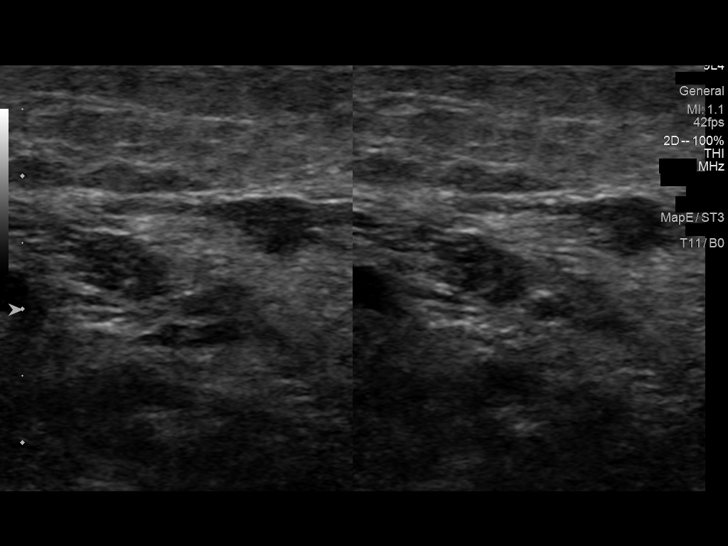
[im 12/35]
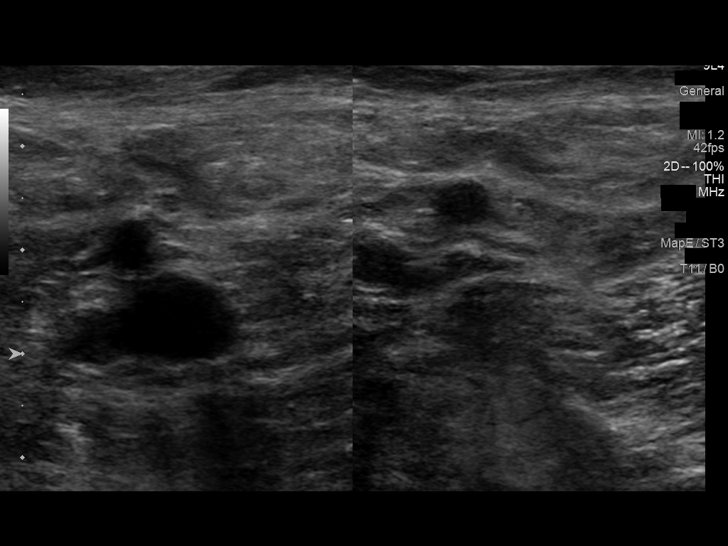
[im 15/35]
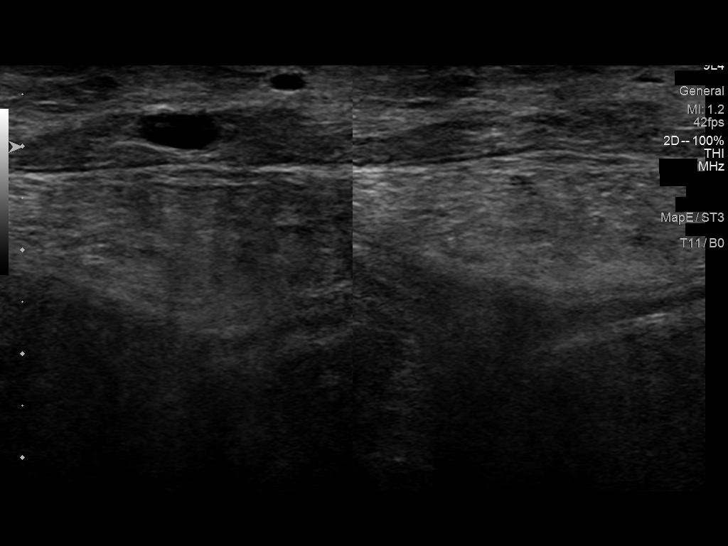
[im 18/35]
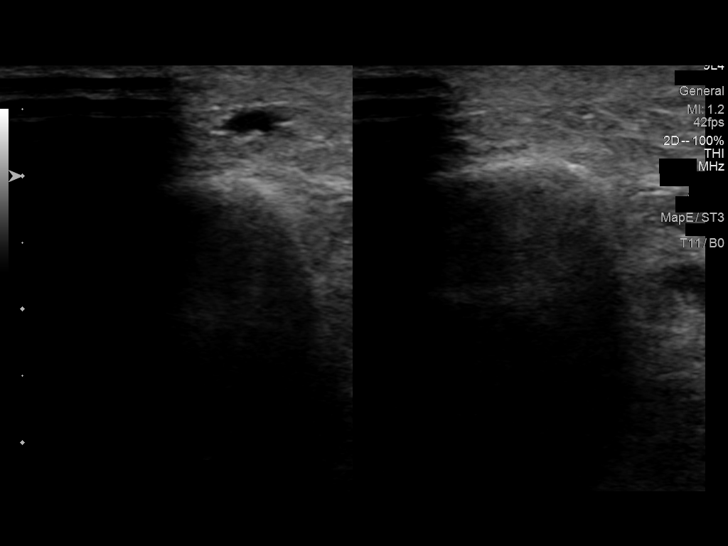
[im 20/35]
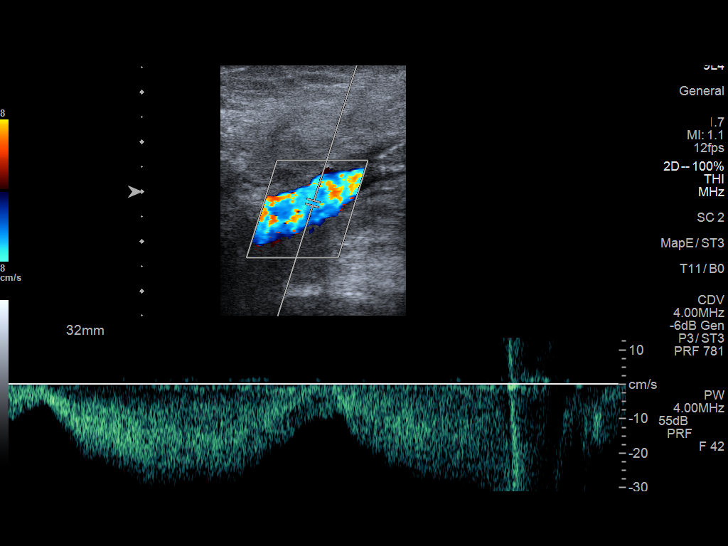
[im 23/35]
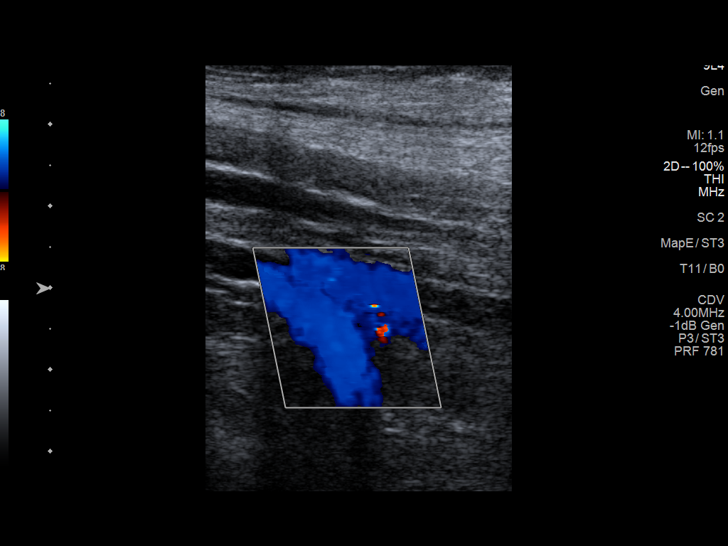
[im 26/35]
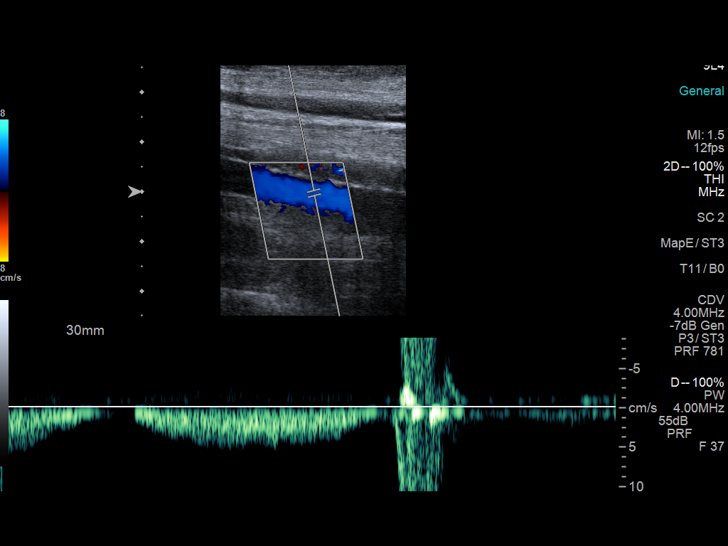
[im 29/35]
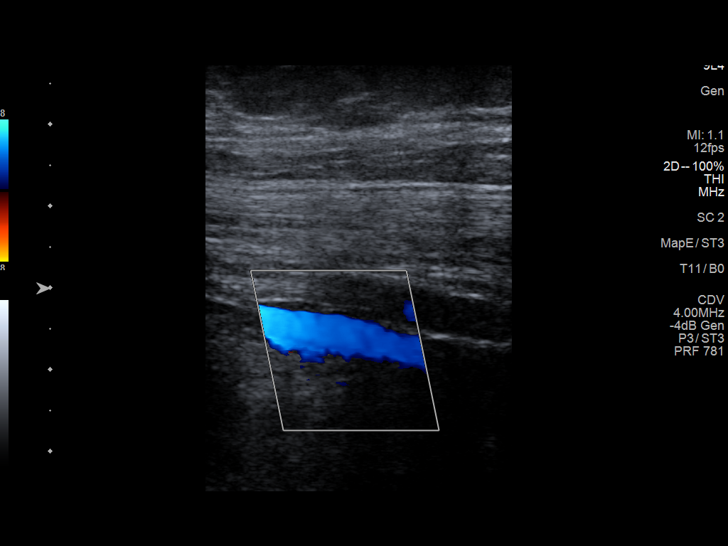
[im 32/35]
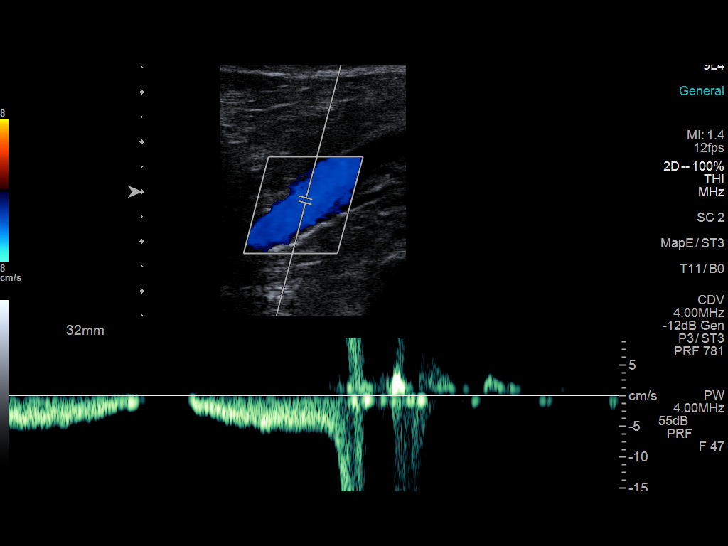
[im 35/35]
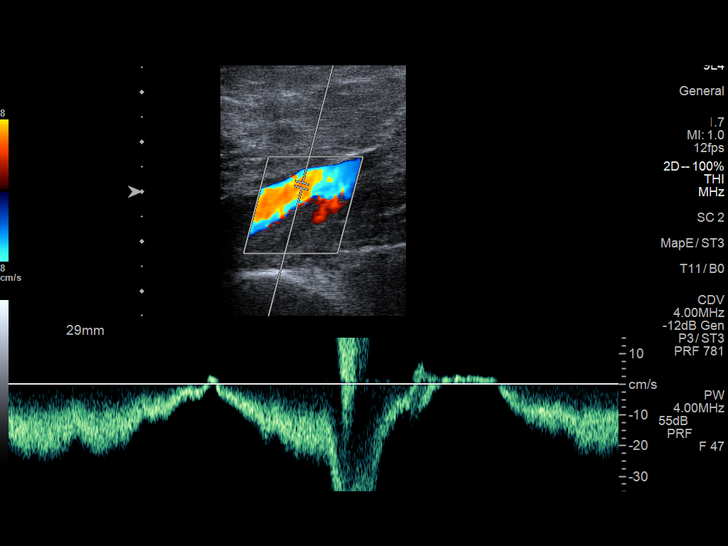

[13 of 24 positions shown; findings below may reference images not displayed]

FINDINGS: Contralateral Common Femoral Vein: Respiratory phasicity is normal
and symmetric with the symptomatic side. No evidence of thrombus.
Normal compressibility.

Common Femoral Vein: No evidence of thrombus. Normal
compressibility, respiratory phasicity and response to augmentation.

Saphenofemoral Junction: No evidence of thrombus. Normal
compressibility and flow on color Doppler imaging.

Profunda Femoral Vein: No evidence of thrombus. Normal
compressibility and flow on color Doppler imaging.

Femoral Vein: No evidence of thrombus. Normal compressibility,
respiratory phasicity and response to augmentation.

Popliteal Vein: No evidence of thrombus. Normal compressibility,
respiratory phasicity and response to augmentation.

Calf Veins: No evidence of thrombus. Normal compressibility and flow
on color Doppler imaging.

Superficial Great Saphenous Vein: No evidence of thrombus. Normal
compressibility.

Venous Reflux:  None.

Other Findings:  None.
IMPRESSION: No evidence of DVT within the left lower extremity.

## 2019-08-03 DIAGNOSIS — H9193 Unspecified hearing loss, bilateral: Secondary | ICD-10-CM | POA: Diagnosis not present

## 2019-08-03 DIAGNOSIS — J343 Hypertrophy of nasal turbinates: Secondary | ICD-10-CM | POA: Diagnosis not present

## 2019-08-03 DIAGNOSIS — H7292 Unspecified perforation of tympanic membrane, left ear: Secondary | ICD-10-CM | POA: Diagnosis not present

## 2019-08-03 DIAGNOSIS — H6121 Impacted cerumen, right ear: Secondary | ICD-10-CM | POA: Diagnosis not present

## 2019-08-03 DIAGNOSIS — Z974 Presence of external hearing-aid: Secondary | ICD-10-CM | POA: Diagnosis not present

## 2020-01-03 ENCOUNTER — Emergency Department (HOSPITAL_COMMUNITY): Payer: Medicare HMO

## 2020-01-03 ENCOUNTER — Other Ambulatory Visit: Payer: Self-pay

## 2020-01-03 ENCOUNTER — Other Ambulatory Visit: Payer: Medicare HMO

## 2020-01-03 ENCOUNTER — Encounter (HOSPITAL_COMMUNITY): Payer: Self-pay

## 2020-01-03 ENCOUNTER — Emergency Department (HOSPITAL_COMMUNITY)
Admission: EM | Admit: 2020-01-03 | Discharge: 2020-01-03 | Disposition: A | Discharge status: Home / Self Care | Payer: Medicare HMO | Attending: Emergency Medicine | Admitting: Emergency Medicine

## 2020-01-03 DIAGNOSIS — E860 Dehydration: Secondary | ICD-10-CM

## 2020-01-03 DIAGNOSIS — Z79899 Other long term (current) drug therapy: Secondary | ICD-10-CM | POA: Insufficient documentation

## 2020-01-03 DIAGNOSIS — I10 Essential (primary) hypertension: Secondary | ICD-10-CM | POA: Insufficient documentation

## 2020-01-03 DIAGNOSIS — U071 COVID-19: Secondary | ICD-10-CM | POA: Diagnosis not present

## 2020-01-03 DIAGNOSIS — E119 Type 2 diabetes mellitus without complications: Secondary | ICD-10-CM | POA: Insufficient documentation

## 2020-01-03 DIAGNOSIS — R000 Tachycardia, unspecified: Secondary | ICD-10-CM | POA: Diagnosis not present

## 2020-01-03 DIAGNOSIS — Z8542 Personal history of malignant neoplasm of other parts of uterus: Secondary | ICD-10-CM | POA: Diagnosis not present

## 2020-01-03 DIAGNOSIS — Z7984 Long term (current) use of oral hypoglycemic drugs: Secondary | ICD-10-CM | POA: Insufficient documentation

## 2020-01-03 DIAGNOSIS — Z23 Encounter for immunization: Secondary | ICD-10-CM | POA: Diagnosis not present

## 2020-01-03 DIAGNOSIS — R0602 Shortness of breath: Secondary | ICD-10-CM | POA: Diagnosis not present

## 2020-01-03 LAB — RESP PANEL BY RT-PCR (FLU A&B, COVID) ARPGX2
Influenza A by PCR: NEGATIVE
Influenza B by PCR: NEGATIVE
SARS Coronavirus 2 by RT PCR: POSITIVE — AB

## 2020-01-03 IMAGING — DX DG CHEST 1V PORT
1 series · 1 of 1 positions shown · non-contrast
Comparison: Chest radiograph [DATE] and earlier.

CLINICAL DATA: 74-year-old female with shortness of breath, recent
positive test 4 [H3].

EXAM:
PORTABLE CHEST 1 VIEW

[chest ap]
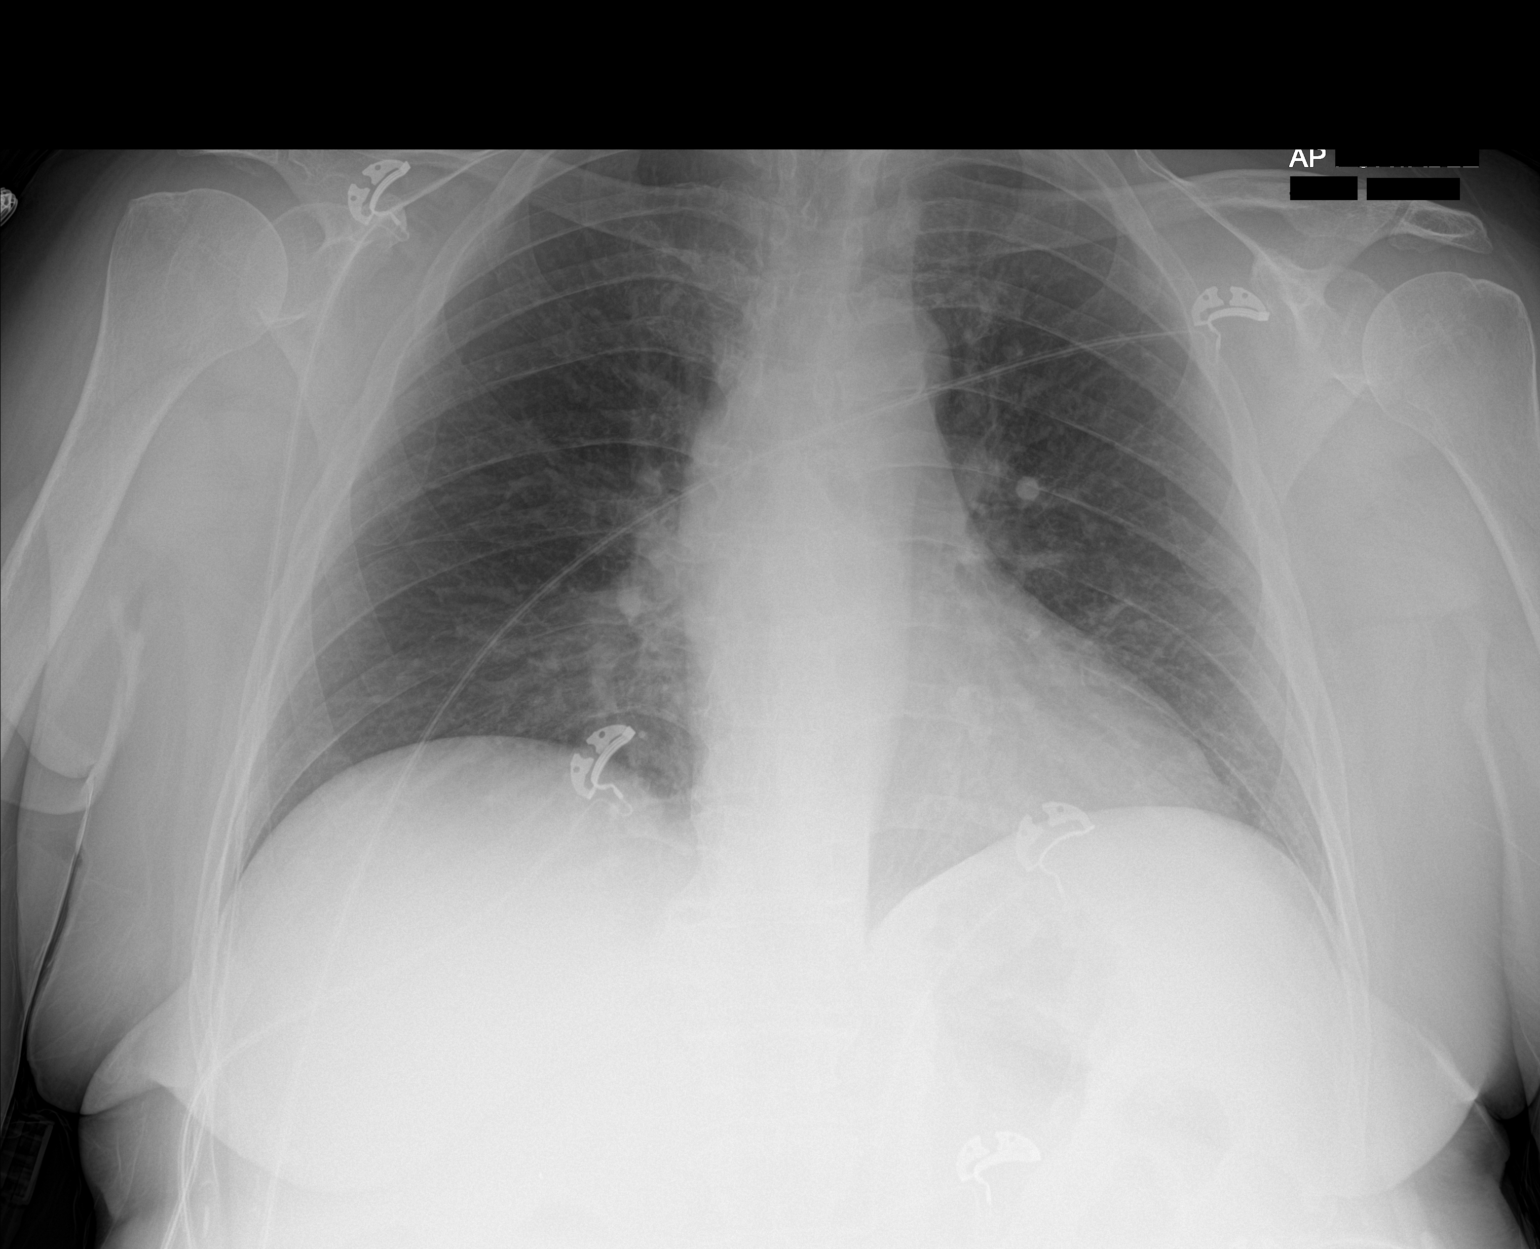

[1 of 1 positions shown; findings below may reference images not displayed]

FINDINGS: Portable AP semi upright view at [H3] hours. Lung volumes and
mediastinal contours are stable and within normal limits. Visualized
tracheal air column is within normal limits. Allowing for portable
technique the lungs are clear. No pneumothorax or pleural effusion.
Negative visible bowel gas pattern. No acute osseous abnormality
identified.
IMPRESSION: Negative portable chest.

## 2020-01-03 MED ORDER — METHYLPREDNISOLONE SODIUM SUCC 125 MG IJ SOLR: 125.0000 mg | Freq: Once | INTRAMUSCULAR | Status: DC | PRN

## 2020-01-03 MED ORDER — FAMOTIDINE IN NACL 20-0.9 MG/50ML-% IV SOLN: 20.0000 mg | Freq: Once | INTRAVENOUS | Status: DC | PRN

## 2020-01-03 MED ORDER — SODIUM CHLORIDE 0.9 % IV SOLN
Freq: Once | INTRAVENOUS | Status: AC
  Filled 2020-01-03: qty 20

## 2020-01-03 MED ORDER — SODIUM CHLORIDE 0.9 % IV BOLUS
500.0000 mL | Freq: Once | INTRAVENOUS | Status: AC
  Administered 2020-01-03: 500 mL via INTRAVENOUS

## 2020-01-03 MED ORDER — DEXAMETHASONE SODIUM PHOSPHATE 10 MG/ML IJ SOLN
10.0000 mg | Freq: Once | INTRAMUSCULAR | Status: AC
  Administered 2020-01-03: 10 mg via INTRAVENOUS
  Filled 2020-01-03: qty 1

## 2020-01-03 MED ORDER — SODIUM CHLORIDE 0.9 % IV SOLN: INTRAVENOUS | Status: DC | PRN

## 2020-01-03 MED ORDER — SODIUM CHLORIDE 0.9 % IV SOLN: 1200.0000 mg | Freq: Once | INTRAVENOUS | Status: DC

## 2020-01-03 MED ORDER — EPINEPHRINE 0.3 MG/0.3ML IJ SOAJ: 0.3000 mg | Freq: Once | INTRAMUSCULAR | Status: DC | PRN

## 2020-01-03 MED ORDER — DIPHENHYDRAMINE HCL 50 MG/ML IJ SOLN: 50.0000 mg | Freq: Once | INTRAMUSCULAR | Status: DC | PRN

## 2020-01-03 MED ORDER — ALBUTEROL SULFATE HFA 108 (90 BASE) MCG/ACT IN AERS: 2.0000 | INHALATION_SPRAY | Freq: Once | RESPIRATORY_TRACT | Status: DC | PRN

## 2020-01-03 NOTE — ED Provider Notes (Signed)
[facility] DEPT Provider Note   CSN: [id number] Arrival date & time: 01/03/20  0900     History Chief Complaint  Patient presents with  . Covid Positive    Meagan [name] is a 74 y.o. female.  Pt presents to the ED today with sob, weakness, and sinus congestion.  She has had covid sx for about a week.  She took a home test.  Her husband also has Covid.  She was not vaccinated.  She has not had an appetite.          Past Medical History:  Diagnosis Date  . Anemia    hx   . Anxiety    no meds  . Arthritis    knees  . Cancer [facility])    endometrial cancer  . Diabetes mellitus without complication ([place])    on oral medication  . GERD (gastroesophageal reflux disease)    tx zantac  . H/O hiatal hernia   . Hypertension   . SVD (spontaneous vaginal delivery)    x 1  . UTI (lower urinary tract infection) 11/22/11   started abx on 11/24/11    Patient Active Problem List   Diagnosis Date Noted  . Dermatitis, eczematoid 02/12/2015  . Diabetes mellitus, type 2 ([place]) 02/12/2015  . Acid reflux 02/12/2015  . Benign essential HTN 02/12/2015  . Combined fat and carbohydrate induced hyperlipemia 02/12/2015  . HLD (hyperlipidemia) 02/12/2015  . Vaginal candidiasis 04/18/2012  . Urinary tract infection 04/18/2012  . Endometrial adenocarcinoma ([place]) 12/08/2011  . Diabetes ([place]) 12/08/2011  . Hypertension 12/08/2011    Past Surgical History:  Procedure Laterality Date  . CHOLECYSTECTOMY    . DILATATION & CURRETTAGE/HYSTEROSCOPY WITH RESECTOCOPE  11/30/2011   Procedure: [place];  Surgeon: Peri Maris, MD;  Location: [facility];  Service: Gynecology;  Laterality: N/A;  . DILATION AND CURETTAGE OF UTERUS    . LYMPH NODE DISSECTION  12/14/2011   Procedure: LYMPH NODE DISSECTION;  Surgeon: Imagene Gurney A. Alycia Rossetti, MD;  Location: [facility];  Service: Gynecology;  Laterality: N/A;  . ROBOTIC ASSISTED TOTAL  HYSTERECTOMY WITH BILATERAL SALPINGO OOPHERECTOMY  12/14/2011   Procedure: ROBOTIC ASSISTED TOTAL HYSTERECTOMY WITH BILATERAL SALPINGO OOPHORECTOMY;  Surgeon: Imagene Gurney A. Alycia Rossetti, MD;  Location: [facility];  Service: Gynecology;  Laterality: N/A;  . WISDOM TOOTH EXTRACTION       OB History    Gravida  1   Para  1   Term      Preterm      AB      Living  1     SAB      IAB      Ectopic      Multiple      Live Births              Family History  Problem Relation Age of Onset  . Skin cancer Sister   . Cancer Paternal Aunt   . Heart disease Mother   . Heart disease Father     Social History   Tobacco Use  . Smoking status: Never Smoker  . Smokeless tobacco: Never Used  Vaping Use  . Vaping Use: Never used  Substance Use Topics  . Alcohol use: No    Alcohol/week: 0.0 standard drinks  . Drug use: No    Home Medications Prior to Admission medications   Medication Sig Start Date End Date Taking? Authorizing Provider  doxazosin (CARDURA) 2 MG tablet Take 2  mg by mouth at bedtime.   Yes [provider]  glimepiride (AMARYL) 4 MG tablet Take 4 mg by mouth 2 (two) times daily. 01/03/20  Yes [provider]  triamterene-hydrochlorothiazide (MAXZIDE-25) 37.5-25 MG tablet Take 1 tablet by mouth at bedtime.   Yes [provider]  miconazole (MICOTIN) 2 % cream Apply 1 application topically 2 (two) times daily. To the vulva Patient not taking: Reported on 01/03/2020 06/09/17   Joylene John D, NP  nitrofurantoin, macrocrystal-monohydrate, (MACROBID) 100 MG capsule Take 1 capsule (100 mg total) by mouth 2 (two) times daily. Patient not taking: Reported on 01/03/2020 02/24/18   Joylene John D, NP    Allergies    Penicillin g, Doxycycline hyclate, Shrimp [shellfish allergy], Sinutab sinus max st [phenylephrine-acetaminophen], and Valacyclovir  Review of Systems   Review of Systems  HENT: Positive for rhinorrhea.   Respiratory: Positive for  shortness of breath.   All other systems reviewed and are negative.   Physical Exam Updated Vital Signs BP 132/69   Pulse 93   Temp 98.6 F (37 C) (Oral)   Resp 20   Ht 5\' 2"  (1.575 m)   Wt 70.3 kg   LMP 01/18/2001   SpO2 95%   BMI 28.35 kg/m   Physical Exam Vitals and nursing note reviewed.  Constitutional:      Appearance: Normal appearance.  HENT:     Head: Normocephalic and atraumatic.     Right Ear: External ear normal.     Left Ear: External ear normal.     Nose: Nose normal.     Mouth/Throat:     Mouth: Mucous membranes are dry.  Eyes:     Extraocular Movements: Extraocular movements intact.     Conjunctiva/sclera: Conjunctivae normal.     Pupils: Pupils are equal, round, and reactive to light.  Cardiovascular:     Rate and Rhythm: Tachycardia present.     Pulses: Normal pulses.     Heart sounds: Normal heart sounds.  Pulmonary:     Effort: Pulmonary effort is normal.     Breath sounds: Normal breath sounds.  Abdominal:     General: Abdomen is flat. Bowel sounds are normal.     Palpations: Abdomen is soft.  Musculoskeletal:        General: Normal range of motion.     Cervical back: Normal range of motion and neck supple.  Skin:    General: Skin is warm.     Capillary Refill: Capillary refill takes less than 2 seconds.  Neurological:     General: No focal deficit present.     Mental Status: She is alert and oriented to person, place, and time.  Psychiatric:        Mood and Affect: Mood normal.        Behavior: Behavior normal.        Thought Content: Thought content normal.        Judgment: Judgment normal.     ED Results / Procedures / Treatments   Labs (all labs ordered are listed, but only abnormal results are displayed) Labs Reviewed  RESP PANEL BY RT-PCR (FLU A&B, COVID) ARPGX2 - Abnormal; Notable for the following components:      Result Value   SARS Coronavirus 2 by RT PCR POSITIVE (*)    All other components within normal limits     EKG None  Radiology DG Chest Portable 1 View  Result Date: 01/03/2020 CLINICAL DATA:  74 year old female with shortness of breath,  recent positive test 4 COVID-19. EXAM: PORTABLE CHEST 1 VIEW COMPARISON:  Chest radiograph 08/03/2014 and earlier. FINDINGS: Portable AP semi upright view at 1012 hours. Lung volumes and mediastinal contours are stable and within normal limits. Visualized tracheal air column is within normal limits. Allowing for portable technique the lungs are clear. No pneumothorax or pleural effusion. Negative visible bowel gas pattern. No acute osseous abnormality identified. IMPRESSION: Negative portable chest. Electronically Signed   By: Genevie Ann M.D.   On: 01/03/2020 10:36    Procedures Procedures (including critical care time)  Medications Ordered in ED Medications  0.9 %  sodium chloride infusion (has no administration in time range)  diphenhydrAMINE (BENADRYL) injection 50 mg (has no administration in time range)  famotidine (PEPCID) IVPB 20 mg premix (has no administration in time range)  methylPREDNISolone sodium succinate (SOLU-MEDROL) 125 mg/2 mL injection 125 mg (has no administration in time range)  albuterol (VENTOLIN HFA) 108 (90 Base) MCG/ACT inhaler 2 puff (has no administration in time range)  EPINEPHrine (EPI-PEN) injection 0.3 mg (has no administration in time range)  sodium chloride 0.9 % bolus 500 mL (0 mLs Intravenous Stopped 01/03/20 1055)  dexamethasone (DECADRON) injection 10 mg (10 mg Intravenous Given 01/03/20 0951)  bamlanivimab 700 mg, etesevimab 1,400 mg in sodium chloride 0.9 % 160 mL IVPB ( Intravenous Stopped 01/03/20 1147)    ED Course  I have reviewed the triage vital signs and the nursing notes.  Pertinent labs & imaging results that were available during my care of the patient were reviewed by me and considered in my medical decision making (see chart for details).    MDM Rules/Calculators/A&P                         Pt given  gentle IVF hydration.  Pt is interested in mab.  She meets criteria for the mab and was given it in the ED.  She was observed for an hour after infusion and has had no adverse side effects.  She is drinking fluids and looks better.  She is stable for d/c.  Return if worse.  KETURA SIREK was evaluated in Emergency Department on 01/03/2020 for the symptoms described in the history of present illness. She was evaluated in the context of the global COVID-19 pandemic, which necessitated consideration that the patient might be at risk for infection with the SARS-CoV-2 virus that causes COVID-19. Institutional protocols and algorithms that pertain to the evaluation of patients at risk for COVID-19 are in a state of rapid change based on information released by regulatory bodies including the CDC and federal and state organizations. These policies and algorithms were followed during the patient's care in the ED. Final Clinical Impression(s) / ED Diagnoses Final diagnoses:  COVID-19  Dehydration    Rx / DC Orders ED Discharge Orders    None       Isla Pence, MD 01/03/20 1258

## 2020-01-03 NOTE — ED Triage Notes (Signed)
Pt ambulates to ED following "recent" positive covid test. Pt unable to tell me which day she was tested. Endorses weakness and loss of appetite. Denies SOB or CP. Pt is afebrile with VS WNL at triage.

## 2020-01-03 NOTE — Discharge Instructions (Addendum)
Person Under Monitoring Name: Meagan [name]  Location: [address] [place] [address]   Infection Prevention Recommendations for Individuals Confirmed to have, or Being Evaluated for, 2019 Novel Coronavirus (COVID-19) Infection Who Receive Care at Home  Individuals who are confirmed to have, or are being evaluated for, COVID-19 should follow the prevention steps below until a healthcare provider or local or state health department says they can return to normal activities.  Stay home except to get medical care You should restrict activities outside your home, except for getting medical care. Do not go to work, school, or public areas, and do not use public transportation or taxis.  Call ahead before visiting your doctor Before your medical appointment, call the healthcare provider and tell them that you have, or are being evaluated for, COVID-19 infection. This will help the healthcare provider's office take steps to keep other people from getting infected. Ask your healthcare provider to call the local or state health department.  Monitor your symptoms Seek prompt medical attention if your illness is worsening (e.g., difficulty breathing). Before going to your medical appointment, call the healthcare provider and tell them that you have, or are being evaluated for, COVID-19 infection. Ask your healthcare provider to call the local or state health department.  Wear a facemask You should wear a facemask that covers your nose and mouth when you are in the same room with other people and when you visit a healthcare provider. People who live with or visit you should also wear a facemask while they are in the same room with you.  Separate yourself from other people in your home As much as possible, you should stay in a different room from other people in your home. Also, you should use a separate bathroom, if available.  Avoid sharing household items You should not  share dishes, drinking glasses, cups, eating utensils, towels, bedding, or other items with other people in your home. After using these items, you should wash them thoroughly with soap and water.  Cover your coughs and sneezes Cover your mouth and nose with a tissue when you cough or sneeze, or you can cough or sneeze into your sleeve. Throw used tissues in a lined trash can, and immediately wash your hands with soap and water for at least 20 seconds or use an alcohol-based hand rub.  Wash your [facility] your hands often and thoroughly with soap and water for at least 20 seconds. You can use an alcohol-based hand sanitizer if soap and water are not available and if your hands are not visibly dirty. Avoid touching your eyes, nose, and mouth with unwashed hands.   Prevention Steps for Caregivers and Household Members of Individuals Confirmed to have, or Being Evaluated for, COVID-19 Infection Being Cared for in the Home  If you live with, or provide care at home for, a person confirmed to have, or being evaluated for, COVID-19 infection please follow these guidelines to prevent infection:  Follow healthcare provider's instructions Make sure that you understand and can help the patient follow any healthcare provider instructions for all care.  Provide for the patient's basic needs You should help the patient with basic needs in the home and provide support for getting groceries, prescriptions, and other personal needs.  Monitor the patient's symptoms If they are getting sicker, call his or her medical provider and tell them that the patient has, or is being evaluated for, COVID-19 infection. This will help the healthcare provider's office  take steps to keep other people from getting infected. Ask the healthcare provider to call the local or state health department.  Limit the number of people who have contact with the patient If possible, have only one caregiver for the  patient. Other household members should stay in another home or place of residence. If this is not possible, they should stay in another room, or be separated from the patient as much as possible. Use a separate bathroom, if available. Restrict visitors who do not have an essential need to be in the home.  Keep older adults, very young children, and other sick people away from the patient Keep older adults, very young children, and those who have compromised immune systems or chronic health conditions away from the patient. This includes people with chronic heart, lung, or kidney conditions, diabetes, and cancer.  Ensure good ventilation Make sure that shared spaces in the home have good air flow, such as from an air conditioner or an opened window, weather permitting.  Wash your hands often Wash your hands often and thoroughly with soap and water for at least 20 seconds. You can use an alcohol based hand sanitizer if soap and water are not available and if your hands are not visibly dirty. Avoid touching your eyes, nose, and mouth with unwashed hands. Use disposable paper towels to dry your hands. If not available, use dedicated cloth towels and replace them when they become wet.  Wear a facemask and gloves Wear a disposable facemask at all times in the room and gloves when you touch or have contact with the patient's blood, body fluids, and/or secretions or excretions, such as sweat, saliva, sputum, nasal mucus, vomit, urine, or feces.  Ensure the mask fits over your nose and mouth tightly, and do not touch it during use. Throw out disposable facemasks and gloves after using them. Do not reuse. Wash your hands immediately after removing your facemask and gloves. If your personal clothing becomes contaminated, carefully remove clothing and launder. Wash your hands after handling contaminated clothing. Place all used disposable facemasks, gloves, and other waste in a lined container before  disposing them with other household waste. Remove gloves and wash your hands immediately after handling these items.  Do not share dishes, glasses, or other household items with the patient Avoid sharing household items. You should not share dishes, drinking glasses, cups, eating utensils, towels, bedding, or other items with a patient who is confirmed to have, or being evaluated for, COVID-19 infection. After the person uses these items, you should wash them thoroughly with soap and water.  Wash laundry thoroughly Immediately remove and wash clothes or bedding that have blood, body fluids, and/or secretions or excretions, such as sweat, saliva, sputum, nasal mucus, vomit, urine, or feces, on them. Wear gloves when handling laundry from the patient. Read and follow directions on labels of laundry or clothing items and detergent. In general, wash and dry with the warmest temperatures recommended on the label.  Clean all areas the individual has used often Clean all touchable surfaces, such as counters, tabletops, doorknobs, bathroom fixtures, toilets, phones, keyboards, tablets, and bedside tables, every day. Also, clean any surfaces that may have blood, body fluids, and/or secretions or excretions on them. Wear gloves when cleaning surfaces the patient has come in contact with. Use a diluted bleach solution (e.g., dilute bleach with 1 part bleach and 10 parts water) or a household disinfectant with a label that says EPA-registered for coronaviruses. To make a bleach  solution at home, add 1 tablespoon of bleach to 1 quart (4 cups) of water. For a larger supply, add  cup of bleach to 1 gallon (16 cups) of water. Read labels of cleaning products and follow recommendations provided on product labels. Labels contain instructions for safe and effective use of the cleaning product including precautions you should take when applying the product, such as wearing gloves or eye protection and making sure you  have good ventilation during use of the product. Remove gloves and wash hands immediately after cleaning.  Monitor yourself for signs and symptoms of illness Caregivers and household members are considered close contacts, should monitor their health, and will be asked to limit movement outside of the home to the extent possible. Follow the monitoring steps for close contacts listed on the symptom monitoring form.   ? If you have additional questions, contact your local health department or call the epidemiologist on call at [phone] (available 24/7). ? This guidance is subject to change. For the most up-to-date guidance from [facility], please refer to their website: [email or web address].pl

## 2020-04-14 ENCOUNTER — Ambulatory Visit: Payer: Medicare HMO | Admitting: Family Medicine

## 2020-05-27 DIAGNOSIS — I10 Essential (primary) hypertension: Secondary | ICD-10-CM | POA: Diagnosis not present

## 2020-05-27 DIAGNOSIS — E1165 Type 2 diabetes mellitus with hyperglycemia: Secondary | ICD-10-CM | POA: Diagnosis not present

## 2020-05-27 DIAGNOSIS — K219 Gastro-esophageal reflux disease without esophagitis: Secondary | ICD-10-CM | POA: Diagnosis not present

## 2020-05-27 DIAGNOSIS — N1831 Chronic kidney disease, stage 3a: Secondary | ICD-10-CM | POA: Diagnosis not present

## 2020-05-27 DIAGNOSIS — E782 Mixed hyperlipidemia: Secondary | ICD-10-CM | POA: Diagnosis not present

## 2020-05-27 DIAGNOSIS — L219 Seborrheic dermatitis, unspecified: Secondary | ICD-10-CM | POA: Diagnosis not present

## 2020-05-27 LAB — COMPREHENSIVE METABOLIC PANEL
Albumin: 3.7 (ref 3.5–5.0)
Calcium: 9.6 (ref 8.7–10.7)

## 2020-05-27 LAB — HEMOGLOBIN A1C: Hemoglobin A1C: 10.5

## 2020-05-27 LAB — BASIC METABOLIC PANEL
BUN: 22 — AB (ref 4–21)
CO2: 25 — AB (ref 13–22)
Chloride: 99 (ref 99–108)
Creatinine: 1 (ref 0.5–1.1)
Glucose: 213
Potassium: 4 (ref 3.4–5.3)
Sodium: 135 — AB (ref 137–147)

## 2020-05-27 LAB — LIPID PANEL
Cholesterol: 142 (ref 0–200)
HDL: 50 (ref 35–70)
LDL Cholesterol: 73
Triglycerides: 107 (ref 40–160)

## 2020-05-27 LAB — TSH: TSH: 1.03 (ref 0.41–5.90)

## 2020-05-27 LAB — HEPATIC FUNCTION PANEL
ALT: 16 (ref 7–35)
AST: 13 (ref 13–35)

## 2020-05-27 LAB — CBC: RBC: 5.32 — AB (ref 3.87–5.11)

## 2020-05-27 LAB — CBC AND DIFFERENTIAL
HCT: 42 (ref 36–46)
Hemoglobin: 14.1 (ref 12.0–16.0)
Platelets: 289 (ref 150–399)
WBC: 10

## 2020-08-26 ENCOUNTER — Other Ambulatory Visit: Payer: Self-pay

## 2020-08-26 ENCOUNTER — Ambulatory Visit (INDEPENDENT_AMBULATORY_CARE_PROVIDER_SITE_OTHER): Payer: Medicare HMO | Admitting: Family Medicine

## 2020-08-26 ENCOUNTER — Encounter: Payer: Self-pay | Admitting: Family Medicine

## 2020-08-26 VITALS — BP 118/71 | HR 96 | Temp 98.0°F | Ht 62.0 in | Wt 160.6 lb

## 2020-08-26 DIAGNOSIS — E785 Hyperlipidemia, unspecified: Secondary | ICD-10-CM | POA: Diagnosis not present

## 2020-08-26 DIAGNOSIS — E1169 Type 2 diabetes mellitus with other specified complication: Secondary | ICD-10-CM | POA: Diagnosis not present

## 2020-08-26 DIAGNOSIS — Z780 Asymptomatic menopausal state: Secondary | ICD-10-CM | POA: Diagnosis not present

## 2020-08-26 DIAGNOSIS — I152 Hypertension secondary to endocrine disorders: Secondary | ICD-10-CM

## 2020-08-26 DIAGNOSIS — E1165 Type 2 diabetes mellitus with hyperglycemia: Secondary | ICD-10-CM | POA: Diagnosis not present

## 2020-08-26 DIAGNOSIS — E1159 Type 2 diabetes mellitus with other circulatory complications: Secondary | ICD-10-CM

## 2020-08-26 DIAGNOSIS — K219 Gastro-esophageal reflux disease without esophagitis: Secondary | ICD-10-CM | POA: Diagnosis not present

## 2020-08-26 DIAGNOSIS — Z1211 Encounter for screening for malignant neoplasm of colon: Secondary | ICD-10-CM | POA: Diagnosis not present

## 2020-08-26 DIAGNOSIS — Z1159 Encounter for screening for other viral diseases: Secondary | ICD-10-CM

## 2020-08-26 MED ORDER — TRIAMTERENE-HCTZ 37.5-25 MG PO TABS: 1.0000 | ORAL_TABLET | Freq: Every day | ORAL | 3 refills | Status: DC

## 2020-08-26 MED ORDER — DOXAZOSIN MESYLATE 2 MG PO TABS: 2.0000 mg | ORAL_TABLET | Freq: Every day | ORAL | 3 refills | Status: DC

## 2020-08-26 MED ORDER — GLIMEPIRIDE 4 MG PO TABS: 4.0000 mg | ORAL_TABLET | Freq: Two times a day (BID) | ORAL | 3 refills | Status: DC

## 2020-08-26 NOTE — Patient Instructions (Addendum)
Please drop off your cholesterol numbers- I did not order cholesterol with next labs  Perhaps sign release for records from last 3 years  Please schedule labs for 4 weeks  at desk before you go  Health Maintenance Due  Topic Date Due   OPHTHALMOLOGY EXAM   Please get this scheduled and have them send me a copy Never done   TETANUS/TDAP - if you get a cut or scrape you are due for this- please get this Never done   Zoster Vaccines- Shingrix (1 of 2)- declines Never done   COLONOSCOPY  Cologuard ordered today- please let us know if you dont receive within 3 weeks Never done   DEXA SCAN   Schedule your bone density test at check out desk. You may also call directly to X-ray at [phone] to schedule an appointment that is convenient for you.  - located [address] across the [address] from [place] - in the basement - you do need an appointment for the bone density tests.   Never done

## 2020-08-26 NOTE — Progress Notes (Signed)
Phone: [phone]   Subjective:  Patient presents today to establish care.  Prior patient of Dr. Kenton Kingfisher with [facility].  Chief Complaint  Patient presents with   New Patient (Initial Visit)    Establishing care , No concerns.     See problem oriented charting  The following were reviewed and entered/updated in epic: Past Medical History:  Diagnosis Date   Anemia    hx    Anxiety    no meds   Arthritis    knees   Cancer ([place])    endometrial cancer   Diabetes mellitus without complication ([place])    on oral medication   GERD (gastroesophageal reflux disease)    pepcid now/famotidine   Hypertension    SVD (spontaneous vaginal delivery)    x 1   UTI (lower urinary tract infection) 11/22/2011   started abx on 11/24/11   Patient Active Problem List   Diagnosis Date Noted   Diabetes mellitus, type 2 ([place]) 02/12/2015    Priority: High   Acid reflux 02/12/2015    Priority: Medium   Hyperlipidemia associated with type 2 diabetes mellitus ([place]) 02/12/2015    Priority: Medium   Hypertension associated with diabetes ([place]) 12/08/2011    Priority: Medium   Dermatitis, eczematoid 02/12/2015    Priority: Low   History of endometrial cancer 12/08/2011    Priority: Low   Past Surgical History:  Procedure Laterality Date   CHOLECYSTECTOMY     DILATATION & CURRETTAGE/HYSTEROSCOPY WITH RESECTOCOPE  11/30/2011   Procedure: DILATATION & CURETTAGE/HYSTEROSCOPY WITH RESECTOCOPE;  Surgeon: Peri Maris, MD;  Location: [facility];  Service: Gynecology;  Laterality: N/A;   DILATION AND CURETTAGE OF UTERUS     LYMPH NODE DISSECTION  12/14/2011   Procedure: LYMPH NODE DISSECTION;  Surgeon: Imagene Gurney A. Alycia Rossetti, MD;  Location: [facility];  Service: Gynecology;  Laterality: N/A;   ROBOTIC ASSISTED TOTAL HYSTERECTOMY WITH BILATERAL SALPINGO OOPHERECTOMY  12/14/2011   Procedure: ROBOTIC ASSISTED TOTAL HYSTERECTOMY WITH BILATERAL SALPINGO OOPHORECTOMY;  Surgeon: Imagene Gurney A. Alycia Rossetti, MD;  Location: [facility];   Service: Gynecology;  Laterality: N/A;   WISDOM TOOTH EXTRACTION      Family History  Problem Relation Age of Onset   Heart disease Mother    Heart disease Father    Diabetes Sister        decline after fall   Other Sister        old age, delcined after husband loss   Diabetes Sister        later had fall   Cancer Paternal Aunt        started in arm reportedly    Medications- reviewed and updated Current Outpatient Medications  Medication Sig Dispense Refill   famotidine (PEPCID) 20 MG tablet      mometasone (ELOCON) 0.1 % lotion      doxazosin (CARDURA) 2 MG tablet Take 1 tablet (2 mg total) by mouth at bedtime. 90 tablet 3   glimepiride (AMARYL) 4 MG tablet Take 1 tablet (4 mg total) by mouth 2 (two) times daily. 180 tablet 3   triamterene-hydrochlorothiazide (MAXZIDE-25) 37.5-25 MG tablet Take 1 tablet by mouth at bedtime. 90 tablet 3   Current Facility-Administered Medications  Medication Dose Route Frequency Provider Last Rate Last Admin   conjugated estrogens (PREMARIN) vaginal cream 0.5 Applicatorful  1 g Vaginal Q3 days Nancy Marus, MD        Allergies-reviewed and updated Allergies  Allergen Reactions   Penicillin G Anaphylaxis   Doxycycline Hyclate  Shrimp [Shellfish Allergy] Nausea And Vomiting   Sinutab Sinus Max St [Phenylephrine-Acetaminophen] Swelling   Valacyclovir Other (See Comments)    Confusion    Social History   Social History Narrative   Married august 1985. 1 son and 1 grandson.       Retired from [employer]      Hobbies: movies, going out to eat    Objective  Objective:  BP 118/71   Pulse 96   Temp 98 F (36.7 C) (Temporal)   Ht '5\' 2"'$  (1.575 m)   Wt 160 lb 9.6 oz (72.8 kg)   LMP 01/18/2001   SpO2 97%   BMI 29.37 kg/m  Gen: NAD, resting comfortably HEENT: Mucous membranes are moist. Oropharynx normal. TM not examined- sees ENT tomorrow- ear drum burst on left per report and on right hearing aid- declines  removing Eyes: sclera and lids normal, PERRLA Neck: no thyromegaly, no cervical lymphadenopathy CV: RRR no murmurs rubs or gallops Lungs: CTAB no crackles, wheeze, rhonchi Abdomen: soft/nontender/nondistended/normal bowel sounds. No rebound or guarding.  Ext: no edema Skin: warm, dry Neuro: 5/5 strength in upper and lower extremities, normal gait, normal reflexes  Diabetic Foot Exam - Simple   Simple Foot Form Diabetic Foot exam was performed with the following findings: Yes 08/26/2020 11:28 AM  Visual Inspection No deformities, no ulcerations, no other skin breakdown bilaterally: Yes Sensation Testing Intact to touch and monofilament testing bilaterally: Yes Pulse Check Posterior Tibialis and Dorsalis pulse intact bilaterally: Yes Comments Healing blister on top of foot        Assessment and Plan:   # Diabetes- on last check in may S: Medication: Glimepiride 4 mg twice daily -last a1c was poorly controlled at 10.5.  - did not tolerate metformin, does not want to try XR CBGs- 190s - trending down Exercise and diet- not exercising. Has improved food choices lately- trying to do veggie, carb- such as potatoes, meat. Trying to avoid sweets and breads  A/P: Diabetes has been poorly controlled-she reports some improvement recently with dietary changes but is not exercising yet-strongly encourage exercise. - She declines metformin - She declines Januvia - She declines jardiance - she declines ozempic and rybelsus - declines referral to diabetes educator -she agrees to come back in 3 weeks to recheck bloodwork   #hypertension S: medication: Triamterene hydrochlorothiazide 37.5-25 mg, cardura 2 mg. Does not check home BP A/P: Stable. Continue current medications.  -reports issues with kidney function in past- does better with staying hydrated.   #hyperlipidemia- noted on problem list S: Medication:none   A/P: Patient denies history of hyperlipidemia-we discussed with diabetes  that even if LDL is very well controlled the recommendation is to take once weekly cholesterol medicine at a minimum-she is not interested in this  # GERD S:Medication: pepid 20 mg once daily A/P: reasonable control- continue current rx   #scap rash- sees dermatology on mometasole solution for scalp   #Patient developed a vesicle on her left foot Sunday- not sure if rubbed against something but she was out in the heat. Seems to be improving gradually- she will let us know if worsens and discussed signs of infection    Recommended follow up: Return in about 4 months (around 12/26/2020) for follow up- or sooner if needed. next visit needs to be 3 months and 1 day after a1c check with labs.  Meds ordered this encounter  Medications   triamterene-hydrochlorothiazide (MAXZIDE-25) 37.5-25 MG tablet    Sig: Take 1 tablet  by mouth at bedtime.    Dispense:  90 tablet    Refill:  3   glimepiride (AMARYL) 4 MG tablet    Sig: Take 1 tablet (4 mg total) by mouth 2 (two) times daily.    Dispense:  180 tablet    Refill:  3   doxazosin (CARDURA) 2 MG tablet    Sig: Take 1 tablet (2 mg total) by mouth at bedtime.    Dispense:  90 tablet    Refill:  3     Return precautions advised. Garret Reddish, MD

## 2020-08-27 DIAGNOSIS — H608X1 Other otitis externa, right ear: Secondary | ICD-10-CM | POA: Diagnosis not present

## 2020-08-27 DIAGNOSIS — H7292 Unspecified perforation of tympanic membrane, left ear: Secondary | ICD-10-CM | POA: Diagnosis not present

## 2020-08-27 DIAGNOSIS — H9212 Otorrhea, left ear: Secondary | ICD-10-CM | POA: Diagnosis not present

## 2020-08-27 DIAGNOSIS — H938X1 Other specified disorders of right ear: Secondary | ICD-10-CM | POA: Diagnosis not present

## 2020-08-28 ENCOUNTER — Encounter: Payer: Self-pay | Admitting: Family Medicine

## 2020-08-29 ENCOUNTER — Other Ambulatory Visit: Payer: Self-pay

## 2020-08-29 MED ORDER — TRIAMTERENE-HCTZ 37.5-25 MG PO TABS: ORAL_TABLET | ORAL | 3 refills | Status: DC

## 2020-09-15 ENCOUNTER — Encounter: Payer: Self-pay | Admitting: Family Medicine

## 2020-09-16 ENCOUNTER — Encounter: Payer: Self-pay | Admitting: Family Medicine

## 2020-09-18 DIAGNOSIS — H7292 Unspecified perforation of tympanic membrane, left ear: Secondary | ICD-10-CM | POA: Diagnosis not present

## 2020-09-18 DIAGNOSIS — H61893 Other specified disorders of external ear, bilateral: Secondary | ICD-10-CM | POA: Diagnosis not present

## 2020-09-18 DIAGNOSIS — Z8709 Personal history of other diseases of the respiratory system: Secondary | ICD-10-CM | POA: Diagnosis not present

## 2020-09-25 ENCOUNTER — Other Ambulatory Visit (INDEPENDENT_AMBULATORY_CARE_PROVIDER_SITE_OTHER): Payer: Medicare HMO

## 2020-09-25 ENCOUNTER — Other Ambulatory Visit: Payer: Self-pay

## 2020-09-25 DIAGNOSIS — Z1159 Encounter for screening for other viral diseases: Secondary | ICD-10-CM

## 2020-09-25 DIAGNOSIS — E1165 Type 2 diabetes mellitus with hyperglycemia: Secondary | ICD-10-CM

## 2020-09-25 DIAGNOSIS — Z780 Asymptomatic menopausal state: Secondary | ICD-10-CM

## 2020-09-25 LAB — HEMOGLOBIN A1C: Hgb A1c MFr Bld: 8.4 % — ABNORMAL HIGH (ref 4.6–6.5)

## 2020-09-25 LAB — MICROALBUMIN / CREATININE URINE RATIO
Creatinine,U: 110.5 mg/dL
Microalb Creat Ratio: 3.5 mg/g (ref 0.0–30.0)
Microalb, Ur: 3.9 mg/dL — ABNORMAL HIGH (ref 0.0–1.9)

## 2020-09-25 LAB — CBC WITH DIFFERENTIAL/PLATELET
Basophils Absolute: 0.1 10*3/uL (ref 0.0–0.1)
Basophils Relative: 0.8 % (ref 0.0–3.0)
Eosinophils Absolute: 0.3 10*3/uL (ref 0.0–0.7)
Eosinophils Relative: 2.8 % (ref 0.0–5.0)
HCT: 41.1 % (ref 36.0–46.0)
Hemoglobin: 13.5 g/dL (ref 12.0–15.0)
Lymphocytes Relative: 16.7 % (ref 12.0–46.0)
Lymphs Abs: 1.9 10*3/uL (ref 0.7–4.0)
MCHC: 32.9 g/dL (ref 30.0–36.0)
MCV: 82.2 fl (ref 78.0–100.0)
Monocytes Absolute: 0.7 10*3/uL (ref 0.1–1.0)
Monocytes Relative: 6.4 % (ref 3.0–12.0)
Neutro Abs: 8.5 10*3/uL — ABNORMAL HIGH (ref 1.4–7.7)
Neutrophils Relative %: 73.3 % (ref 43.0–77.0)
Platelets: 266 10*3/uL (ref 150.0–400.0)
RBC: 5.01 Mil/uL (ref 3.87–5.11)
RDW: 14.2 % (ref 11.5–15.5)
WBC: 11.6 10*3/uL — ABNORMAL HIGH (ref 4.0–10.5)

## 2020-09-25 LAB — COMPREHENSIVE METABOLIC PANEL
ALT: 24 U/L (ref 0–35)
AST: 16 U/L (ref 0–37)
Albumin: 3.7 g/dL (ref 3.5–5.2)
Alkaline Phosphatase: 84 U/L (ref 39–117)
BUN: 39 mg/dL — ABNORMAL HIGH (ref 6–23)
CO2: 25 mEq/L (ref 19–32)
Calcium: 9.5 mg/dL (ref 8.4–10.5)
Chloride: 101 mEq/L (ref 96–112)
Creatinine, Ser: 1.22 mg/dL — ABNORMAL HIGH (ref 0.40–1.20)
GFR: 43.9 mL/min — ABNORMAL LOW (ref >60.00)
Glucose, Bld: 113 mg/dL — ABNORMAL HIGH (ref 70–99)
Potassium: 3.6 mEq/L (ref 3.5–5.1)
Sodium: 136 mEq/L (ref 135–145)
Total Bilirubin: 0.5 mg/dL (ref 0.2–1.2)
Total Protein: 7.9 g/dL (ref 6.0–8.3)

## 2020-09-26 LAB — HEPATITIS C ANTIBODY
Hepatitis C Ab: NONREACTIVE
SIGNAL TO CUT-OFF: 0.09 (ref <1.00)

## 2020-12-26 ENCOUNTER — Other Ambulatory Visit: Payer: Self-pay

## 2020-12-26 ENCOUNTER — Telehealth: Payer: Medicare HMO | Admitting: Family Medicine

## 2020-12-26 ENCOUNTER — Encounter: Payer: Self-pay | Admitting: Family Medicine

## 2020-12-28 NOTE — Progress Notes (Signed)
Patient not seen. Plan was in person then virtual but she did not have virtual access and for complaint like pink eye recommended in person- she was not able to get transport to clinic

## 2020-12-29 ENCOUNTER — Telehealth (INDEPENDENT_AMBULATORY_CARE_PROVIDER_SITE_OTHER): Payer: Medicare HMO | Admitting: Family Medicine

## 2020-12-29 ENCOUNTER — Telehealth: Payer: Self-pay

## 2020-12-29 ENCOUNTER — Encounter: Payer: Self-pay | Admitting: Family Medicine

## 2020-12-29 DIAGNOSIS — R0982 Postnasal drip: Secondary | ICD-10-CM | POA: Diagnosis not present

## 2020-12-29 DIAGNOSIS — J029 Acute pharyngitis, unspecified: Secondary | ICD-10-CM

## 2020-12-29 NOTE — Progress Notes (Signed)
Virtual Visit via Video Note  I connected with Meagan [name] on 12/29/20 at  4:30 PM EST by a video enabled telemedicine application 2/2 [id number] pandemic and verified that I am speaking with the correct person using two identifiers.  Location patient: home Location provider:work or home office Persons participating in the virtual visit: patient, provider  I discussed the limitations of evaluation and management by telemedicine and the availability of in person appointments. The patient expressed understanding and agreed to proceed. Chief Complaint  Patient presents with   Cough    Pt has cough and sore throat for 10 day. Has taken tylenol for pain and drinking warm tea with lemon     HPI: Pt states 10 days ago she developed pink eye, sore throat, and cough.  Pink eye has since resolved.  Has rhinorrhea after coughing and occasional post tussive emesis.  Denies fever, chills, HAs, ear pain/pressure, facial pain/ pressure, diarrhea, n/v.  Pt tried Nyquil, tylenol, gargling with warm salt water.  Pt's grandson had pink eye.  Pt tested negative for COVID last wk.    ROS: See pertinent positives and negatives per HPI.  Past Medical History:  Diagnosis Date   Anemia    hx    Anxiety    no meds   Arthritis    knees   Cancer ([place])    endometrial cancer   Diabetes mellitus without complication ([place])    on oral medication   GERD (gastroesophageal reflux disease)    pepcid now/famotidine   Hypertension    SVD (spontaneous vaginal delivery)    x 1   UTI (lower urinary tract infection) 11/22/2011   started abx on 11/24/11    Past Surgical History:  Procedure Laterality Date   CHOLECYSTECTOMY     DILATATION & CURRETTAGE/HYSTEROSCOPY WITH RESECTOCOPE  11/30/2011   Procedure: [place];  Surgeon: Peri Maris, MD;  Location: [facility];  Service: Gynecology;  Laterality: N/A;   DILATION AND CURETTAGE OF UTERUS     LYMPH NODE DISSECTION   12/14/2011   Procedure: LYMPH NODE DISSECTION;  Surgeon: Imagene Gurney A. Alycia Rossetti, MD;  Location: [facility];  Service: Gynecology;  Laterality: N/A;   ROBOTIC ASSISTED TOTAL HYSTERECTOMY WITH BILATERAL SALPINGO OOPHERECTOMY  12/14/2011   Procedure: ROBOTIC ASSISTED TOTAL HYSTERECTOMY WITH BILATERAL SALPINGO OOPHORECTOMY;  Surgeon: Imagene Gurney A. Alycia Rossetti, MD;  Location: [facility];  Service: Gynecology;  Laterality: N/A;   WISDOM TOOTH EXTRACTION      Family History  Problem Relation Age of Onset   Heart disease Mother    Heart disease Father    Diabetes Sister        decline after fall   Other Sister        old age, delcined after husband loss   Diabetes Sister        later had fall   Cancer Paternal Aunt        started in arm reportedly   Current Outpatient Medications:    doxazosin (CARDURA) 2 MG tablet, Take 1 tablet (2 mg total) by mouth at bedtime., Disp: 90 tablet, Rfl: 3   famotidine (PEPCID) 20 MG tablet, , Disp: , Rfl:    glimepiride (AMARYL) 4 MG tablet, Take 1 tablet (4 mg total) by mouth 2 (two) times daily., Disp: 180 tablet, Rfl: 3   mometasone (ELOCON) 0.1 % lotion, , Disp: , Rfl:    triamterene-hydrochlorothiazide (MAXZIDE-25) 37.5-25 MG tablet, Take one tablet daily in the morning., Disp: 90 tablet, Rfl: 3  Current Facility-Administered Medications:    conjugated estrogens (PREMARIN) vaginal cream 0.5 Applicatorful, 1 g, Vaginal, Q3 days, Nancy Marus, MD  EXAM:  VITALS per patient if applicable:  RR between 12-20 bpm  GENERAL: alert, oriented, appears well and in no acute distress  HEENT: atraumatic, conjunctiva clear, no obvious abnormalities on inspection of external nose and ears  NECK: normal movements of the head and neck  LUNGS: on inspection no signs of respiratory distress, breathing rate appears normal, no obvious gross SOB, gasping or wheezing  CV: no obvious cyanosis  MS: moves all visible extremities without noticeable abnormality  PSYCH/NEURO: pleasant and  cooperative, no obvious depression or anxiety, speech and thought processing grossly intact  ASSESSMENT AND PLAN:  Discussed the following assessment and plan:  Sore throat  Post-nasal drainage  Discussed possible causes including viral etiology, strep pharyngitis, post nasal drainage, GERD.  Discussed POC strep testing.  Pt declines 2/2 transportation and financial concerns.  Discussed OTC antihistamine such as Allegra or nasal spray such as flonase.  Pt again declines.  States unable to go to pharmacy.  Pt upset an abx is not being called in for her.  Discussed abx resistance and need for strep testing.  Pt to continue pepcid and supportive care.  F/u with pcp   I discussed the assessment and treatment plan with the patient. The patient was provided an opportunity to ask questions and all were answered. The patient agreed with the plan and demonstrated an understanding of the instructions.   The patient was advised to call back or seek an in-person evaluation if the symptoms worsen or if the condition fails to improve as anticipated.  Billie Ruddy, MD

## 2020-12-29 NOTE — Telephone Encounter (Signed)
Pt has ov scheduled for 01/01/21.

## 2020-12-29 NOTE — Telephone Encounter (Signed)
Nurse Assessment Nurse: Burnadette Peter, RN, Elmo Putt Date/Time Meagan [name] Time): 12/27/2020 1:33:44 PM Confirm and document reason for call. If symptomatic, describe symptoms. ---Caller states she has a sore throat and can barely swallow. Caller states she has a cough as well. Caller states she's had pink eye for 8 days. Caller states she used eye drops for the pink eye that was left over from her grandson. Caller states the eye is better than it was. Caller states her sore throat and cough is what's really bothering her right now. Caller states she was tested negative for COVID this week. Caller denies any other symptoms.  Does the patient have any new or worsening symptoms? ---Yes Will a triage be completed? ---Yes Related visit to physician within the last 2 weeks? ---No Does the PT have any chronic conditions? (i.e. diabetes, asthma, this includes High risk factors for pregnancy, etc.) ---No Is this a behavioral health or substance abuse call? ---No  Influenza - Seasonal Patient is HIGH RISK (e.g., age > 38 years, pregnant, HIV Burnadette Peter, RN, Elmo Putt 17/51/0258 5:27:78 PM  Disp. Time Meagan [name] Time) Disposition Final User 12/27/2020 1:46:21 PM Paged On Call back to [facility], [place], Elmo Putt 24/23/5361 4:43:15 PM Paged On Call back to [facility], [place], Elmo Putt 40/08/6759 9:50:93 PM Paged On Call back to [facility], [place], Elmo Putt 26/71/2458 0:99:83 PM Paged On Call back to [facility], RN, Elmo Putt 38/25/0539 7:67:34 PM Paged On Call back to [facility], RN, Elmo Putt 19/37/9024 0:97:35 PM Paged On Call back to [facility], [place], Elmo Putt 32/99/2426 8:34:19 PM Call PCP within 24 Hours Yes Burnadette Peter, RN, Nena Jordan caller of Dr. Rometta Emery orders. At this time, she recommends the patient to go to an UC to get tested and evaluated for strep and FLU. She also explained that the patient can take Ibuprofen 800 mg TID for the sore throat and if she doesn't feel better by Monday to  call the office first thing in the morning. Dr. Diona Browner also advised the patient can use saline drops for pink he instead of her grandson's drops. No other new orders at this time. Caller states she refuses to go to the UC or to the doctor's office since she can't afford to go. Caller was very upset but recommendations from doctor were given to the caller.

## 2021-01-01 ENCOUNTER — Ambulatory Visit: Payer: Medicare HMO | Admitting: Family Medicine

## 2021-01-05 ENCOUNTER — Telehealth: Payer: Self-pay | Admitting: Family Medicine

## 2021-01-05 NOTE — Telephone Encounter (Signed)
Copied from [place] [phone]. Topic: Medicare AWV >> Jan 05, 2021 10:36 AM Harris-Coley, Hannah Beat wrote: Reason for CRM: Left message for patient to schedule Annual Wellness Visit.  Please schedule with Nurse Health Advisor Charlott Rakes, RN at [facility].  Please call [phone] ask for [facility]

## 2021-01-26 ENCOUNTER — Telehealth: Payer: Self-pay

## 2021-01-26 NOTE — Telephone Encounter (Signed)
Called and lm for pt tcb regarding the status of her Cologuard, has she done it?

## 2021-01-26 NOTE — Telephone Encounter (Signed)
-----   Message from Marin Olp, MD sent at 01/25/2021  7:26 PM EST ----- Team can you update me on the status of this? ----- Message ----- From: SYSTEM Sent: 11/24/2020  12:14 AM EST To: Marin Olp, MD

## 2021-02-16 NOTE — Progress Notes (Signed)
Phone [phone] In person visit   Subjective:   Meagan [name] is a 76 y.o. year old very pleasant female patient who presents for/with See problem oriented charting Chief Complaint  Patient presents with   Follow-up   Diabetes   Hypertension   Hyperlipidemia   Gastroesophageal Reflux    This visit occurred during the SARS-CoV-2 public health emergency.  Safety protocols were in place, including screening questions prior to the visit, additional usage of staff PPE, and extensive cleaning of exam room while observing appropriate contact time as indicated for disinfecting solutions.   Past Medical History-  Patient Active Problem List   Diagnosis Date Noted   Diabetes mellitus, type 2 ([place]) 02/12/2015    Priority: High   Acid reflux 02/12/2015    Priority: Medium    Hyperlipidemia associated with type 2 diabetes mellitus ([place]) 02/12/2015    Priority: Medium    Hypertension associated with diabetes ([place]) 12/08/2011    Priority: Medium    Dermatitis, eczematoid 02/12/2015    Priority: Low   History of endometrial cancer 12/08/2011    Priority: Low    Medications- reviewed and updated Current Outpatient Medications  Medication Sig Dispense Refill   doxazosin (CARDURA) 2 MG tablet Take 1 tablet (2 mg total) by mouth at bedtime. 90 tablet 3   famotidine (PEPCID) 20 MG tablet      glimepiride (AMARYL) 4 MG tablet Take 1 tablet (4 mg total) by mouth 2 (two) times daily. 180 tablet 3   mometasone (ELOCON) 0.1 % lotion      triamterene-hydrochlorothiazide (MAXZIDE-25) 37.5-25 MG tablet Take one tablet daily in the morning. 90 tablet 3   Current Facility-Administered Medications  Medication Dose Route Frequency Provider Last Rate Last Admin   conjugated estrogens (PREMARIN) vaginal cream 0.5 Applicatorful  1 g Vaginal Q3 days Nancy Marus, MD         Objective:  BP 120/64    Pulse 96    Temp (!) 97.1 F (36.2 C)    Ht 5\' 2"  (1.575 m)    Wt 162 lb 6.4 oz (73.7 kg)     LMP 01/18/2001    SpO2 98%    BMI 29.70 kg/m  Gen: NAD, resting comfortably CV: RRR no murmurs rubs or gallops Lungs: CTAB no crackles, wheeze, rhonchi Ext: no edema Skin: warm, dry    Assessment and Plan   # Diabetes S: Medication: Glimepiride 4 mg twice daily CBGs- tries her best to keep under 200 Exercise and diet- not exercising, weight trending up with holidays Lab Results  Component Value Date   HGBA1C 8.4 (H) 09/25/2020   HGBA1C 10.5 05/27/2020   A/P: Very poor control anticipated.  Patient firmly declines medication changes-discussed multiple options.  Declines endocrinology or nutrition referral.  Only wants to work on lifestyle.  #hypertension S: medication: Triamterene hydrochlorothiazide 37.5-25 mg daily, cardura 2 mg at bedtime BP Readings from Last 3 Encounters:  02/17/21 120/64  08/26/20 118/71  01/03/20 122/63  A/P: Controlled. Continue current medications.  Needs to push fluids particularly with BUN/cr ratio high and GFR in 40s  #hyperlipidemia S: Medication:none, patient not interested Lab Results  Component Value Date   CHOL 142 05/27/2020   HDL 50 05/27/2020   LDLCALC 73 05/27/2020   TRIG 107 05/27/2020   A/P: Poor control-not interested in meds.  Recommended statin to get LDL under 70-she declines  Health Maintenance Due  Topic Date Due   OPHTHALMOLOGY EXAM-asked her to get this done and  send [place] a copy Never done   COLONOSCOPY declines colonoscopy or Cologuard despite risk of colon cancer Never done   Recommended Follow up: Return in about 4 months (around 06/17/2021) for follow up- or sooner if needed.  Lab/Order associations:   ICD-10-CM   1. Poorly controlled diabetes mellitus (HCC)  E11.65 Comprehensive metabolic panel    Hemoglobin A1c    POCT Urinalysis Dipstick (Automated)    CBC with Differential/Platelet    2. Hyperlipidemia associated with type 2 diabetes mellitus (HCC)  E11.69 Comprehensive metabolic panel   R48.5 POCT Urinalysis  Dipstick (Automated)    CBC with Differential/Platelet    3. Primary hypertension  I10 Comprehensive metabolic panel    POCT Urinalysis Dipstick (Automated)    CBC with Differential/Platelet      No orders of the defined types were placed in this encounter.   I,Jada Bradford,acting as a scribe for Garret Reddish, MD.,have documented all relevant documentation on the behalf of Garret Reddish, MD,as directed by  Garret Reddish, MD while in the presence of Garret Reddish, MD.  I, Garret Reddish, MD, have reviewed all documentation for this visit. The documentation on 02/17/21 for the exam, diagnosis, procedures, and orders are all accurate and complete.  Return precautions advised.  Garret Reddish, MD

## 2021-02-17 ENCOUNTER — Encounter: Payer: Self-pay | Admitting: Family Medicine

## 2021-02-17 ENCOUNTER — Other Ambulatory Visit: Payer: Self-pay

## 2021-02-17 ENCOUNTER — Ambulatory Visit (INDEPENDENT_AMBULATORY_CARE_PROVIDER_SITE_OTHER): Payer: Medicare HMO | Admitting: Family Medicine

## 2021-02-17 VITALS — BP 120/64 | HR 96 | Temp 97.1°F | Ht 62.0 in | Wt 162.4 lb

## 2021-02-17 DIAGNOSIS — E1165 Type 2 diabetes mellitus with hyperglycemia: Secondary | ICD-10-CM

## 2021-02-17 DIAGNOSIS — I10 Essential (primary) hypertension: Secondary | ICD-10-CM | POA: Diagnosis not present

## 2021-02-17 DIAGNOSIS — E785 Hyperlipidemia, unspecified: Secondary | ICD-10-CM

## 2021-02-17 DIAGNOSIS — E1169 Type 2 diabetes mellitus with other specified complication: Secondary | ICD-10-CM

## 2021-02-17 NOTE — Patient Instructions (Addendum)
Schedule eye exam . You declined colonoscopy  Schedule a lab visit at the check out desk within 2 weeks. Return for future fasting labs meaning nothing but water after midnight please. Ok to take your medications with water.   No changes today

## 2021-02-26 ENCOUNTER — Other Ambulatory Visit (INDEPENDENT_AMBULATORY_CARE_PROVIDER_SITE_OTHER): Payer: Medicare HMO

## 2021-02-26 ENCOUNTER — Ambulatory Visit: Payer: Medicare HMO | Admitting: Family Medicine

## 2021-02-26 ENCOUNTER — Other Ambulatory Visit: Payer: Self-pay

## 2021-02-26 DIAGNOSIS — E785 Hyperlipidemia, unspecified: Secondary | ICD-10-CM

## 2021-02-26 DIAGNOSIS — E1169 Type 2 diabetes mellitus with other specified complication: Secondary | ICD-10-CM

## 2021-02-26 DIAGNOSIS — I10 Essential (primary) hypertension: Secondary | ICD-10-CM

## 2021-02-26 DIAGNOSIS — E1165 Type 2 diabetes mellitus with hyperglycemia: Secondary | ICD-10-CM

## 2021-02-26 LAB — COMPREHENSIVE METABOLIC PANEL
ALT: 17 U/L (ref 0–35)
AST: 14 U/L (ref 0–37)
Albumin: 3.6 g/dL (ref 3.5–5.2)
Alkaline Phosphatase: 107 U/L (ref 39–117)
BUN: 36 mg/dL — ABNORMAL HIGH (ref 6–23)
CO2: 29 mEq/L (ref 19–32)
Calcium: 9.6 mg/dL (ref 8.4–10.5)
Chloride: 100 mEq/L (ref 96–112)
Creatinine, Ser: 1.26 mg/dL — ABNORMAL HIGH (ref 0.40–1.20)
GFR: 42.1 mL/min — ABNORMAL LOW (ref >60.00)
Glucose, Bld: 235 mg/dL — ABNORMAL HIGH (ref 70–99)
Potassium: 4 mEq/L (ref 3.5–5.1)
Sodium: 138 mEq/L (ref 135–145)
Total Bilirubin: 0.4 mg/dL (ref 0.2–1.2)
Total Protein: 8.2 g/dL (ref 6.0–8.3)

## 2021-02-26 LAB — CBC WITH DIFFERENTIAL/PLATELET
Basophils Absolute: 0.1 10*3/uL (ref 0.0–0.1)
Basophils Relative: 0.5 % (ref 0.0–3.0)
Eosinophils Absolute: 0.4 10*3/uL (ref 0.0–0.7)
Eosinophils Relative: 3.8 % (ref 0.0–5.0)
HCT: 40.3 % (ref 36.0–46.0)
Hemoglobin: 13.2 g/dL (ref 12.0–15.0)
Lymphocytes Relative: 23 % (ref 12.0–46.0)
Lymphs Abs: 2.4 10*3/uL (ref 0.7–4.0)
MCHC: 32.9 g/dL (ref 30.0–36.0)
MCV: 81.2 fl (ref 78.0–100.0)
Monocytes Absolute: 0.8 10*3/uL (ref 0.1–1.0)
Monocytes Relative: 7.4 % (ref 3.0–12.0)
Neutro Abs: 6.7 10*3/uL (ref 1.4–7.7)
Neutrophils Relative %: 65.3 % (ref 43.0–77.0)
Platelets: 292 10*3/uL (ref 150.0–400.0)
RBC: 4.96 Mil/uL (ref 3.87–5.11)
RDW: 13.7 % (ref 11.5–15.5)
WBC: 10.3 10*3/uL (ref 4.0–10.5)

## 2021-02-26 LAB — POC URINALSYSI DIPSTICK (AUTOMATED)
Bilirubin, UA: NEGATIVE
Blood, UA: POSITIVE
Glucose, UA: NEGATIVE
Ketones, UA: NEGATIVE
Nitrite, UA: NEGATIVE
Protein, UA: NEGATIVE
Spec Grav, UA: 1.015 (ref 1.010–1.025)
Urobilinogen, UA: 0.2 E.U./dL
pH, UA: 6 (ref 5.0–8.0)

## 2021-02-26 LAB — HEMOGLOBIN A1C: Hgb A1c MFr Bld: 9.4 % — ABNORMAL HIGH (ref 4.6–6.5)

## 2021-02-27 ENCOUNTER — Other Ambulatory Visit: Payer: Self-pay

## 2021-02-27 DIAGNOSIS — R319 Hematuria, unspecified: Secondary | ICD-10-CM

## 2021-03-02 ENCOUNTER — Observation Stay (HOSPITAL_COMMUNITY)
Admission: EM | Admit: 2021-03-02 | Discharge: 2021-03-03 | Disposition: A | Discharge status: Home / Self Care | Payer: Medicare HMO | Attending: Internal Medicine | Admitting: Internal Medicine

## 2021-03-02 ENCOUNTER — Other Ambulatory Visit: Payer: Self-pay

## 2021-03-02 ENCOUNTER — Encounter (HOSPITAL_COMMUNITY): Payer: Self-pay | Admitting: *Deleted

## 2021-03-02 ENCOUNTER — Other Ambulatory Visit (INDEPENDENT_AMBULATORY_CARE_PROVIDER_SITE_OTHER): Payer: Medicare HMO

## 2021-03-02 ENCOUNTER — Emergency Department (HOSPITAL_COMMUNITY): Payer: Medicare HMO

## 2021-03-02 DIAGNOSIS — K219 Gastro-esophageal reflux disease without esophagitis: Secondary | ICD-10-CM

## 2021-03-02 DIAGNOSIS — E1159 Type 2 diabetes mellitus with other circulatory complications: Secondary | ICD-10-CM | POA: Diagnosis not present

## 2021-03-02 DIAGNOSIS — M6281 Muscle weakness (generalized): Secondary | ICD-10-CM | POA: Diagnosis not present

## 2021-03-02 DIAGNOSIS — R319 Hematuria, unspecified: Secondary | ICD-10-CM | POA: Diagnosis not present

## 2021-03-02 DIAGNOSIS — N1831 Chronic kidney disease, stage 3a: Secondary | ICD-10-CM | POA: Diagnosis not present

## 2021-03-02 DIAGNOSIS — I10 Essential (primary) hypertension: Secondary | ICD-10-CM | POA: Diagnosis present

## 2021-03-02 DIAGNOSIS — R410 Disorientation, unspecified: Secondary | ICD-10-CM | POA: Insufficient documentation

## 2021-03-02 DIAGNOSIS — G459 Transient cerebral ischemic attack, unspecified: Secondary | ICD-10-CM | POA: Diagnosis not present

## 2021-03-02 DIAGNOSIS — Z79899 Other long term (current) drug therapy: Secondary | ICD-10-CM | POA: Diagnosis not present

## 2021-03-02 DIAGNOSIS — Z7984 Long term (current) use of oral hypoglycemic drugs: Secondary | ICD-10-CM | POA: Insufficient documentation

## 2021-03-02 DIAGNOSIS — R4701 Aphasia: Secondary | ICD-10-CM | POA: Diagnosis not present

## 2021-03-02 DIAGNOSIS — Z859 Personal history of malignant neoplasm, unspecified: Secondary | ICD-10-CM | POA: Diagnosis not present

## 2021-03-02 DIAGNOSIS — E1165 Type 2 diabetes mellitus with hyperglycemia: Secondary | ICD-10-CM | POA: Insufficient documentation

## 2021-03-02 DIAGNOSIS — Z20822 Contact with and (suspected) exposure to covid-19: Secondary | ICD-10-CM | POA: Diagnosis not present

## 2021-03-02 DIAGNOSIS — I129 Hypertensive chronic kidney disease with stage 1 through stage 4 chronic kidney disease, or unspecified chronic kidney disease: Secondary | ICD-10-CM | POA: Insufficient documentation

## 2021-03-02 DIAGNOSIS — I152 Hypertension secondary to endocrine disorders: Secondary | ICD-10-CM | POA: Diagnosis not present

## 2021-03-02 DIAGNOSIS — R000 Tachycardia, unspecified: Secondary | ICD-10-CM | POA: Diagnosis not present

## 2021-03-02 DIAGNOSIS — E119 Type 2 diabetes mellitus without complications: Secondary | ICD-10-CM

## 2021-03-02 LAB — DIFFERENTIAL
Abs Immature Granulocytes: 0.12 10*3/uL — ABNORMAL HIGH (ref 0.00–0.07)
Basophils Absolute: 0.1 10*3/uL (ref 0.0–0.1)
Basophils Relative: 1 %
Eosinophils Absolute: 0.4 10*3/uL (ref 0.0–0.5)
Eosinophils Relative: 3 %
Immature Granulocytes: 1 %
Lymphocytes Relative: 17 %
Lymphs Abs: 2.2 10*3/uL (ref 0.7–4.0)
Monocytes Absolute: 0.7 10*3/uL (ref 0.1–1.0)
Monocytes Relative: 6 %
Neutro Abs: 9.1 10*3/uL — ABNORMAL HIGH (ref 1.7–7.7)
Neutrophils Relative %: 72 %

## 2021-03-02 LAB — COMPREHENSIVE METABOLIC PANEL
ALT: 21 U/L (ref 0–44)
AST: 19 U/L (ref 15–41)
Albumin: 3.3 g/dL — ABNORMAL LOW (ref 3.5–5.0)
Alkaline Phosphatase: 102 U/L (ref 38–126)
Anion gap: 12 (ref 5–15)
BUN: 22 mg/dL (ref 8–23)
CO2: 23 mmol/L (ref 22–32)
Calcium: 9.2 mg/dL (ref 8.9–10.3)
Chloride: 98 mmol/L (ref 98–111)
Creatinine, Ser: 1.22 mg/dL — ABNORMAL HIGH (ref 0.44–1.00)
GFR, Estimated: 47 mL/min — ABNORMAL LOW (ref >60)
Glucose, Bld: 292 mg/dL — ABNORMAL HIGH (ref 70–99)
Potassium: 4 mmol/L (ref 3.5–5.1)
Sodium: 133 mmol/L — ABNORMAL LOW (ref 135–145)
Total Bilirubin: 0.3 mg/dL (ref 0.3–1.2)
Total Protein: 7.9 g/dL (ref 6.5–8.1)

## 2021-03-02 LAB — CBG MONITORING, ED
Glucose-Capillary: 298 mg/dL — ABNORMAL HIGH (ref 70–99)
Glucose-Capillary: 284 mg/dL — ABNORMAL HIGH (ref 70–99)

## 2021-03-02 LAB — RESP PANEL BY RT-PCR (FLU A&B, COVID) ARPGX2
Influenza A by PCR: NEGATIVE
Influenza B by PCR: NEGATIVE
SARS Coronavirus 2 by RT PCR: NEGATIVE

## 2021-03-02 LAB — I-STAT CHEM 8, ED
BUN: 25 mg/dL — ABNORMAL HIGH (ref 8–23)
Calcium, Ion: 1.11 mmol/L — ABNORMAL LOW (ref 1.15–1.40)
Chloride: 99 mmol/L (ref 98–111)
Creatinine, Ser: 1.1 mg/dL — ABNORMAL HIGH (ref 0.44–1.00)
Glucose, Bld: 287 mg/dL — ABNORMAL HIGH (ref 70–99)
HCT: 43 % (ref 36.0–46.0)
Hemoglobin: 14.6 g/dL (ref 12.0–15.0)
Potassium: 3.7 mmol/L (ref 3.5–5.1)
Sodium: 136 mmol/L (ref 135–145)
TCO2: 26 mmol/L (ref 22–32)

## 2021-03-02 LAB — PROTIME-INR
INR: 1 (ref 0.8–1.2)
Prothrombin Time: 12.9 seconds (ref 11.4–15.2)

## 2021-03-02 LAB — CBC
HCT: 42.1 % (ref 36.0–46.0)
Hemoglobin: 13.6 g/dL (ref 12.0–15.0)
MCH: 26.7 pg (ref 26.0–34.0)
MCHC: 32.3 g/dL (ref 30.0–36.0)
MCV: 82.5 fL (ref 80.0–100.0)
Platelets: 316 10*3/uL (ref 150–400)
RBC: 5.1 MIL/uL (ref 3.87–5.11)
RDW: 13 % (ref 11.5–15.5)
WBC: 12.7 10*3/uL — ABNORMAL HIGH (ref 4.0–10.5)
nRBC: 0 % (ref 0.0–0.2)

## 2021-03-02 LAB — APTT: aPTT: 28 seconds (ref 24–36)

## 2021-03-02 LAB — I-STAT BETA HCG BLOOD, ED (MC, WL, AP ONLY): I-stat hCG, quantitative: 6.7 m[IU]/mL — ABNORMAL HIGH (ref <5)

## 2021-03-02 IMAGING — CT CT HEAD W/O CM
4 series · 16 of 47 positions shown, 18 images · non-contrast
Comparison: None.

CLINICAL DATA: Transient ischemic attack.



[Series 3: head bone · axial · 0.48mm/px · z∈[-170,-140]mm · 3 of 77 slices shown]
[im 8/77  bone]
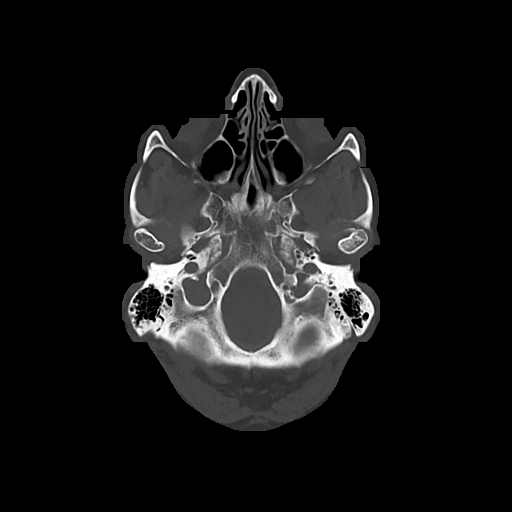
[im 16/77  bone]
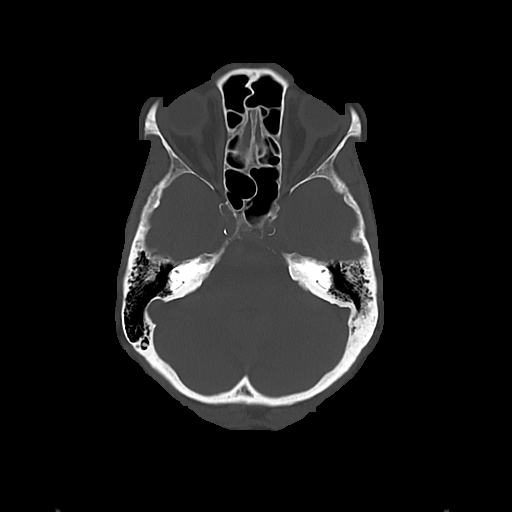
[im 23/77  bone]
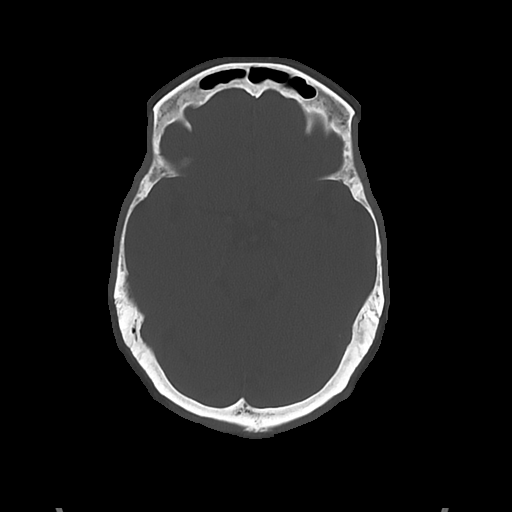

[Series 4: head wo · axial · 0.48mm/px · z∈[-170,-54]mm · 7 of 31 slices shown, 9 images]
[im 4/31  brain]
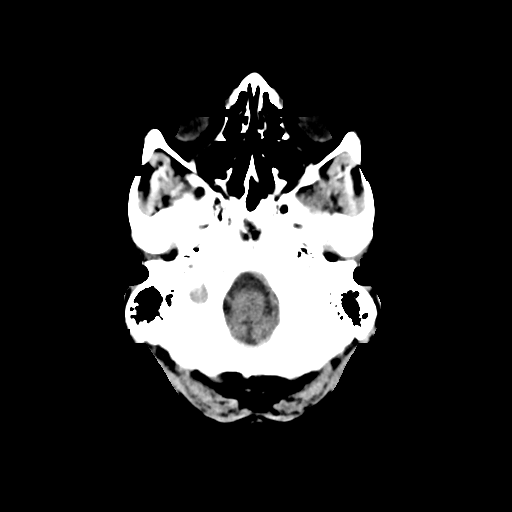
[im 4/31  bone]
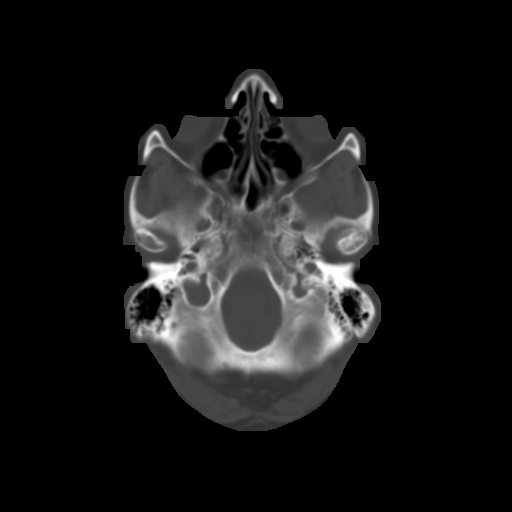
[im 8/31  brain]
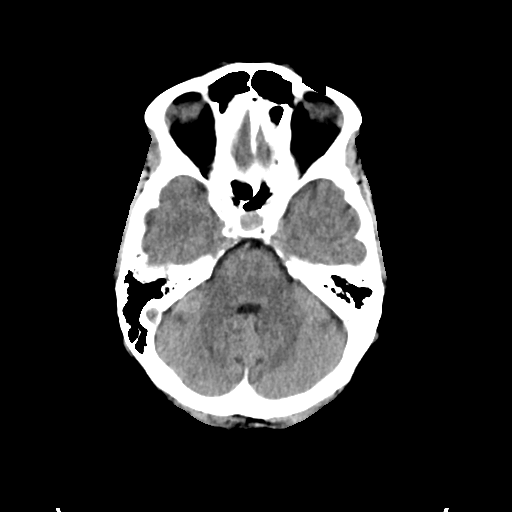
[im 12/31  brain]
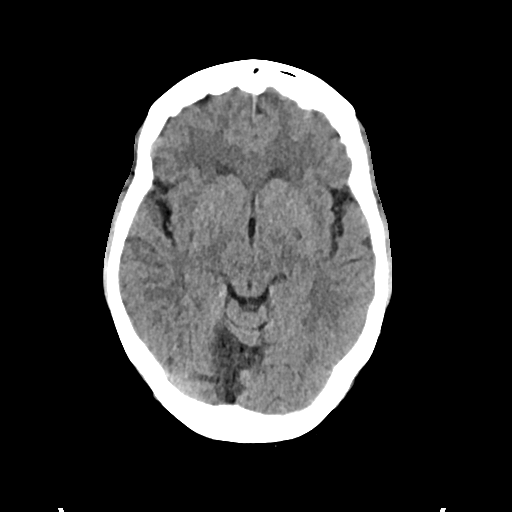
[im 16/31  brain]
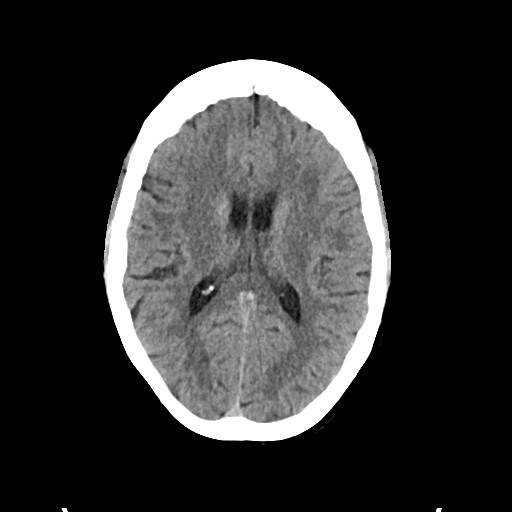
[im 19/31  brain]
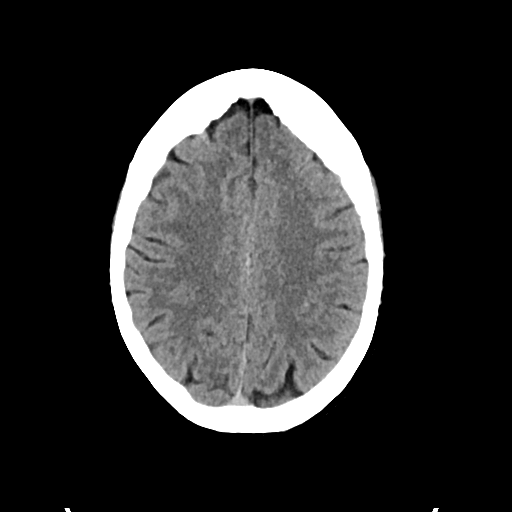
[im 19/31  bone]
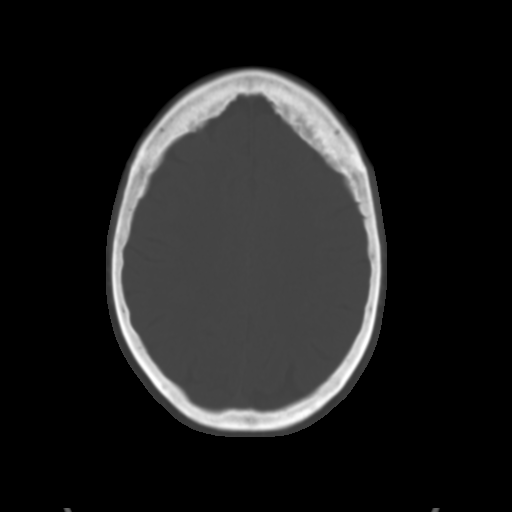
[im 23/31  brain]
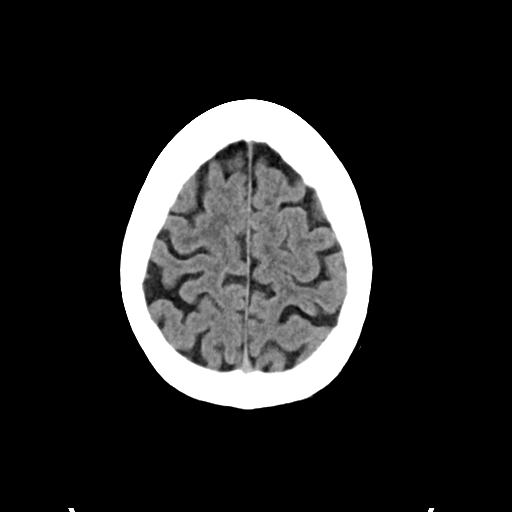
[im 27/31  brain]
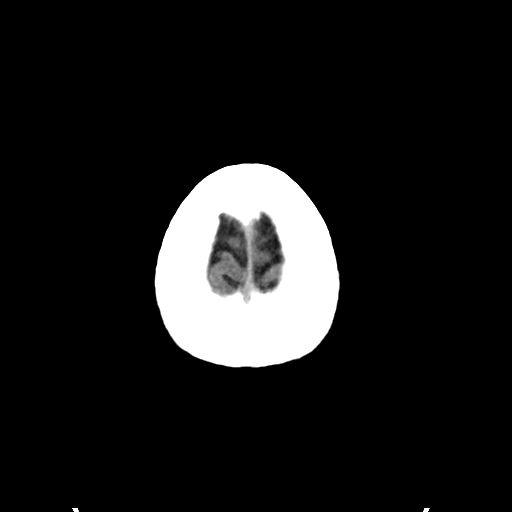

[Series 5: cor soft · coronal · 0.32mm/px · 3 of 67 slices shown]
[im 23/67  brain]
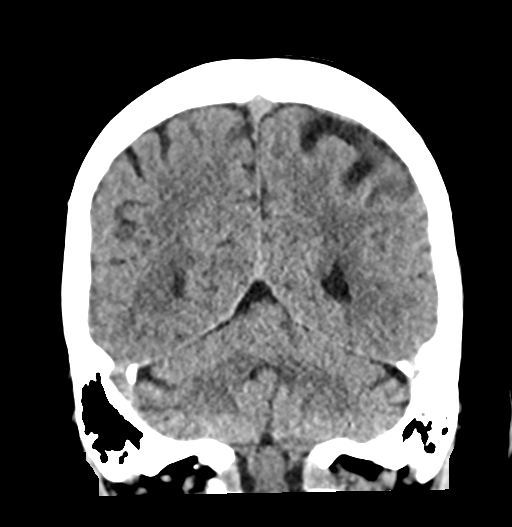
[im 30/67  brain]
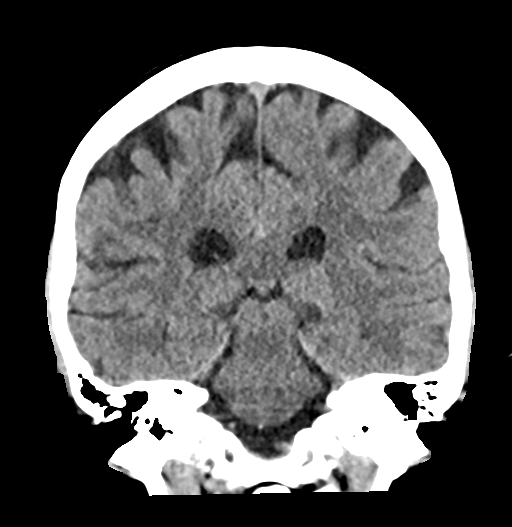
[im 37/67  brain]
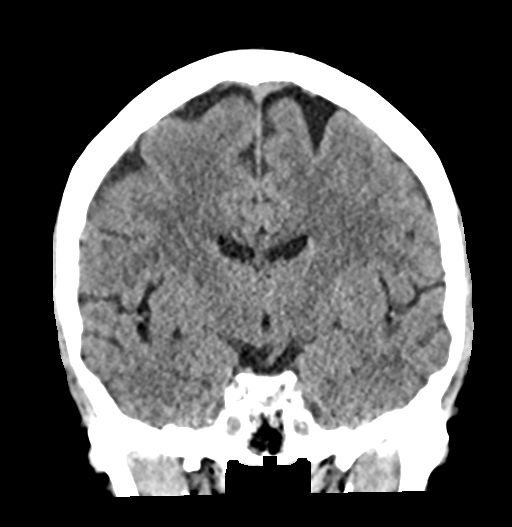

[Series 6: sag soft · sagittal · 0.33mm/px · 3 of 54 slices shown]
[im 18/54  brain]
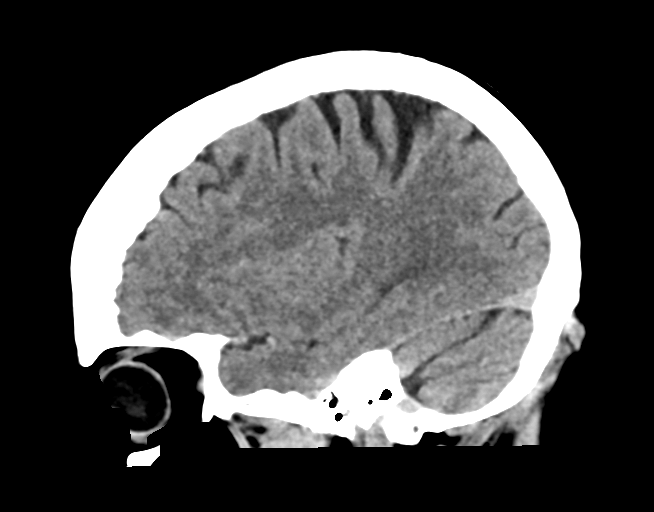
[im 27/54  brain]
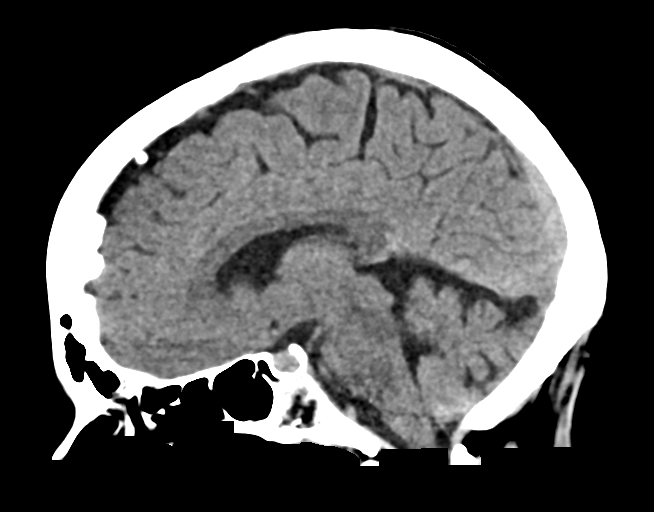
[im 36/54  brain]
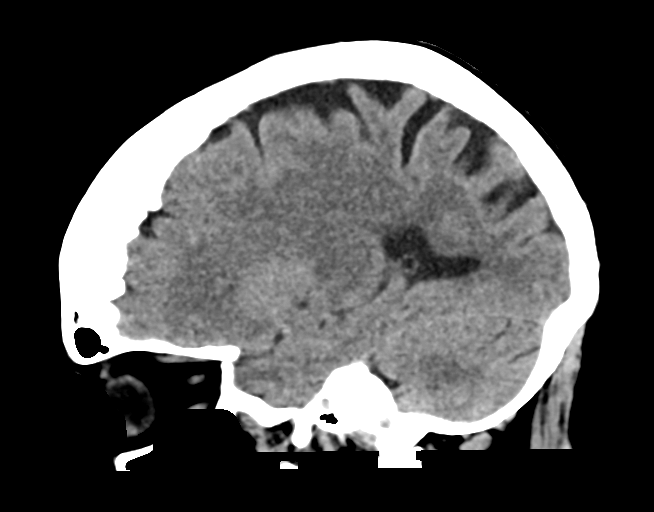

[16 of 47 positions shown; findings below may reference images not displayed]

FINDINGS: Brain: No acute intracranial hemorrhage, midline shift or mass
effect. No extra-axial fluid collection. Gray-white matter
differentiation is within normal limits. No hydrocephalus. Minimal
periventricular white matter hypodensities are noted bilaterally.

Vascular: No hyperdense vessel or unexpected calcification.

Skull: Normal. Negative for fracture or focal lesion.

Sinuses/Orbits: Mild mucosal thickening in the left maxillary sinus.
No acute orbital abnormality.

Other: None.
IMPRESSION: 1. No acute intracranial hemorrhage.
2. Mild chronic microvascular ischemic changes.

## 2021-03-02 MED ORDER — STROKE: EARLY STAGES OF RECOVERY BOOK
Freq: Once | Status: AC
  Filled 2021-03-02: qty 1

## 2021-03-02 MED ORDER — LORAZEPAM 2 MG/ML IJ SOLN
0.5000 mg | Freq: Once | INTRAMUSCULAR | Status: AC
  Administered 2021-03-03: 0.5 mg via INTRAVENOUS
  Filled 2021-03-02: qty 1

## 2021-03-02 MED ORDER — ENOXAPARIN SODIUM 40 MG/0.4ML IJ SOSY
40.0000 mg | PREFILLED_SYRINGE | Freq: Every day | INTRAMUSCULAR | Status: DC
  Filled 2021-03-02: qty 0.4

## 2021-03-02 MED ORDER — SODIUM CHLORIDE 0.9% FLUSH: 3.0000 mL | Freq: Once | INTRAVENOUS | Status: DC

## 2021-03-02 MED ORDER — ASPIRIN EC 81 MG PO TBEC
81.0000 mg | DELAYED_RELEASE_TABLET | Freq: Every day | ORAL | Status: DC
  Administered 2021-03-03: 81 mg via ORAL
  Filled 2021-03-02: qty 1

## 2021-03-02 NOTE — Consult Note (Signed)
NEUROLOGY CONSULTATION NOTE   Date of service: March 02, 2021 Patient Name: Meagan [name] MRN:  [id number] DOB:  05/17/1945 Reason for consult: "episode of speech trouble that self resolved" Requesting Provider: Orene Desanctis, DO _ _ _   _ __   _ __ _ _  __ __   _ __   __ _  History of Present Illness  Meagan [name] is a 76 y.o. female with PMH significant for anemia, GERD, DM2, HTN who presents with episode of difficulty finding words.  She was with her husband in the afternoon when she had sudden onset difficulty with words. She was unable to name objects, speech was much lower than her baseline and she had to think really hard to find the appropriate words. Lasted 10-15 mins and resolved.  Has a hx of Diabetes, HTN, no hx of HLD. Reports HTN has been out of control over the last few days. Most recent HbA1c of 9.4.  LKW: 4627 on 03/02/21 mRS: 0 tNKASE/Thrombectomy: not offered 2/2 resolution of symptoms. NIHSS components Score: Comment  1a Level of Conscious 0[x]  1[]  2[]  3[]      1b LOC Questions 0[x]  1[]  2[]       1c LOC Commands 0[x]  1[]  2[]       2 Best Gaze 0[x]  1[]  2[]       3 Visual 0[x]  1[]  2[]  3[]      4 Facial Palsy 0[x]  1[]  2[]  3[]      5a Motor Arm - left 0[x]  1[]  2[]  3[]  4[]  UN[]    5b Motor Arm - Right 0[x]  1[]  2[]  3[]  4[]  UN[]    6a Motor Leg - Left 0[x]  1[]  2[]  3[]  4[]  UN[]    6b Motor Leg - Right 0[x]  1[]  2[]  3[]  4[]  UN[]    7 Limb Ataxia 0[x]  1[]  2[]  3[]  UN[]     8 Sensory 0[x]  1[]  2[]  UN[]      9 Best Language 0[x]  1[]  2[]  3[]      10 Dysarthria 0[x]  1[]  2[]  UN[]      11 Extinct. and Inattention 0[x]  1[]  2[]       TOTAL: 0     ROS   Constitutional Denies weight loss, fever and chills.   HEENT Denies changes in vision and hearing.   Respiratory Denies SOB and cough.   CV Denies palpitations and CP   GI Denies abdominal pain, nausea, vomiting and diarrhea.   GU Denies dysuria and urinary frequency.   MSK Denies myalgia and joint pain.   Skin Denies rash and  pruritus.   Neurological Denies headache and syncope.   Psychiatric Denies recent changes in mood. Denies anxiety and depression.    Past History   Past Medical History:  Diagnosis Date   Anemia    hx    Anxiety    no meds   Arthritis    knees   Cancer ([place])    endometrial cancer   Diabetes mellitus without complication ([place])    on oral medication   GERD (gastroesophageal reflux disease)    pepcid now/famotidine   Hypertension    SVD (spontaneous vaginal delivery)    x 1   UTI (lower urinary tract infection) 11/22/2011   started abx on 11/24/11   Past Surgical History:  Procedure Laterality Date   CHOLECYSTECTOMY     DILATATION & CURRETTAGE/HYSTEROSCOPY WITH RESECTOCOPE  11/30/2011   Procedure: [place];  Surgeon: Peri Maris, MD;  Location: [facility];  Service: Gynecology;  Laterality: N/A;   DILATION AND CURETTAGE  OF UTERUS     LYMPH NODE DISSECTION  12/14/2011   Procedure: LYMPH NODE DISSECTION;  Surgeon: Imagene Gurney A. Alycia Rossetti, MD;  Location: [facility];  Service: Gynecology;  Laterality: N/A;   ROBOTIC ASSISTED TOTAL HYSTERECTOMY WITH BILATERAL SALPINGO OOPHERECTOMY  12/14/2011   Procedure: ROBOTIC ASSISTED TOTAL HYSTERECTOMY WITH BILATERAL SALPINGO OOPHORECTOMY;  Surgeon: Imagene Gurney A. Alycia Rossetti, MD;  Location: [facility];  Service: Gynecology;  Laterality: N/A;   WISDOM TOOTH EXTRACTION     Family History  Problem Relation Age of Onset   Heart disease Mother    Heart disease Father    Diabetes Sister        decline after fall   Other Sister        old age, delcined after husband loss   Diabetes Sister        later had fall   Cancer Paternal Aunt        started in arm reportedly   Social History   Socioeconomic History   Marital status: Married    Spouse name: Not on file   Number of children: Not on file   Years of education: Not on file   Highest education level: Not on file  Occupational History   Not on file  Tobacco Use    Smoking status: Never   Smokeless tobacco: Never  Vaping Use   Vaping Use: Never used  Substance and Sexual Activity   Alcohol use: No    Alcohol/week: 0.0 standard drinks   Drug use: No   Sexual activity: Yes    Partners: Male    Birth control/protection: None    Comment: husband had a vasectomy, pt with hysterectomy  Other Topics Concern   Not on file  Social History Narrative   Married august 1985. 1 son and 1 grandson.       Retired from [employer] paper      Hobbies: movies, going out to eat   Social Determinants of Radio broadcast assistant Strain: Not on file  Food Insecurity: Not on file  Transportation Needs: Not on file  Physical Activity: Not on file  Stress: Not on file  Social Connections: Not on file   Allergies  Allergen Reactions   Penicillin G Anaphylaxis   Doxycycline Hyclate    Shrimp [Shellfish Allergy] Nausea And Vomiting   Sinutab Sinus Max St [Phenylephrine-Acetaminophen] Swelling   Valacyclovir Other (See Comments)    Confusion    Medications  (Not in a hospital admission)    Vitals   Vitals:   03/02/21 1942 03/02/21 2136 03/02/21 2300  BP: (!) 158/69 (!) 144/65 (!) 125/53  Pulse: (!) 107 98 84  Resp: 16 16 20   Temp: 98.6 F (37 C) 98 F (36.7 C)   TempSrc: Oral Oral   SpO2: 97% 98% 96%     There is no height or weight on file to calculate BMI.  Physical Exam   General: Laying comfortably in bed; in no acute distress.  HENT: Normal oropharynx and mucosa. Normal external appearance of ears and nose.  Neck: Supple, no pain or tenderness  CV: No JVD. No peripheral edema.  Pulmonary: Symmetric Chest rise. Normal respiratory effort.  Abdomen: Soft to touch, non-tender.  Ext: No cyanosis, edema, or deformity  Skin: No rash. Normal palpation of skin.   Musculoskeletal: Normal digits and nails by inspection. No clubbing.   Neurologic Examination  Mental status/Cognition: Alert, oriented to self, place, month and  year, good attention.  Speech/language: Fluent, comprehension  intact, object naming intact, repetition intact.  Cranial nerves:   CN II Pupils equal and reactive to light, no VF deficits    CN III,IV,VI EOM intact, no gaze preference or deviation, no nystagmus    CN V normal sensation in V1, V2, and V3 segments bilaterally    CN VII no asymmetry, no nasolabial fold flattening    CN VIII normal hearing to speech    CN IX & X normal palatal elevation, no uvular deviation    CN XI 5/5 head turn and 5/5 shoulder shrug bilaterally    CN XII midline tongue protrusion    Motor:  Muscle bulk: normal, tone normal, pronator drift none tremor none Mvmt Root Nerve  Muscle Right Left Comments  SA C5/6 Ax Deltoid 5 5   EF C5/6 Mc Biceps 5 5   EE C6/7/8 Rad Triceps 5 5   WF C6/7 Med FCR     WE C7/8 PIN ECU     F Ab C8/T1 U ADM/FDI 5 5   HF L1/2/3 Fem Illopsoas 4+ 4+   KE L2/3/4 Fem Quad 5 5   DF L4/5 D Peron Tib Ant 5 5   PF S1/2 Tibial Grc/Sol 5 5    Reflexes:  Right Left Comments  Pectoralis      Biceps (C5/6) 2 2   Brachioradialis (C5/6) 2 2    Triceps (C6/7) 2 2    Patellar (L3/4) 2 2    Achilles (S1)      Hoffman      Plantar     Jaw jerk    Sensation:  Light touch Intact throughout   Pin prick    Temperature    Vibration   Proprioception    Coordination/Complex Motor:  - Finger to Nose intact BL - Heel to shin intact BL - Rapid alternating movement are slowed throughout - Gait: Deferred.  Labs   CBC:  Recent Labs  Lab 02/26/21 1016 03/02/21 2003 03/02/21 2037  WBC 10.3 12.7*  --   NEUTROABS 6.7 9.1*  --   HGB 13.2 13.6 14.6  HCT 40.3 42.1 43.0  MCV 81.2 82.5  --   PLT 292.0 316  --     Basic Metabolic Panel:  Lab Results  Component Value Date   NA 136 03/02/2021   K 3.7 03/02/2021   CO2 23 03/02/2021   GLUCOSE 287 (H) 03/02/2021   BUN 25 (H) 03/02/2021   CREATININE 1.10 (H) 03/02/2021   CALCIUM 9.2 03/02/2021   GFRNONAA 47 (L) 03/02/2021   GFRAA 65  (L) 12/15/2011   Lipid Panel:  Lab Results  Component Value Date   LDLCALC 73 05/27/2020   HgbA1c:  Lab Results  Component Value Date   HGBA1C 9.4 (H) 02/26/2021   Urine Drug Screen: No results found for: LABOPIA, COCAINSCRNUR, LABBENZ, AMPHETMU, THCU, LABBARB  Alcohol Level No results found for: [place]  CT Head without contrast(Personally reviewed): CTH was negative for a large hypodensity concerning for a large territory infarct or hyperdensity concerning for an ICH  CT angio Head and Neck with contrast: pending  MRI Brain: pending  Impression   Meagan [name] is a 76 y.o. female with PMH significant for anemia, GERD, DM2, HTN who presents with episode of difficulty finding words lasting 10-15 mins and spontaneously resolved. Her neurologic examination is notable for no focal deficit.  Episode is concerning for a TIA, unlikely for her to have a first time seizure at her age.  Primary Diagnosis:  Cerebral infarction, unspecified.  Secondary Diagnosis: Essential (primary) hypertension and Type 2 diabetes mellitus w/o complications  Recommendations  Plan:  - Frequent Neuro checks per stroke unit protocol - Recommend brain imaging with MRI Brain without contrast - Recommend Vascular imaging with CT angio head and neck. - Recommend obtaining TTE - Recommend obtaining Lipid panel with LDL - Please start statin if LDL > 70 - Recommend HbA1c - Antithrombotic - Aspirin 81mg  daily. - Recommend DVT ppx - SBP goal - permissive hypertension first 24 h < 220/110. Held home meds.  - Recommend Telemetry monitoring for arrythmia - Recommend bedside swallow screen prior to PO intake. - Stroke education booklet - Recommend PT/OT/SLP consult  ______________________________________________________________________  Plan discussed with Dr. Flossie Buffy with the hospitalist team.  Thank you for the opportunity to take part in the care of this patient. If you have any further questions,  please contact the neurology consultation attending.  Signed,  [place] Pager Number [phone] _ _ _   _ __   _ __ _ _  __ __   _ __   __ _

## 2021-03-02 NOTE — ED Notes (Signed)
Patient transported to CT scan . 

## 2021-03-02 NOTE — H&P (Addendum)
History and Physical    Patient: Meagan [name] QAS:[id number] DOB: [birth year] DOA: 03/02/2021 DOS: the patient was seen and examined on 03/02/2021 PCP: Marin Olp, MD  Patient coming from: Home  Chief Complaint:  Chief Complaint  Patient presents with   Aphasia    HPI: Meagan [name] is a 76 y.o. female with medical history significant of HTN, Type 2 DM, CKD stage 3a, HLD, and GERD who presents with trouble word findings.   Around 6:30 this evening, she had sudden onset trouble of naming objects. Could not tell her husband what a water bottle was. Lasted about 10-15 mins.  Symptoms have not completely resolved denies any facial drooping or extremity weakness.  No headache or vision change.  No chest pain or palpitation. Has noticed for the past few days double for blood pressure and glucose has been high in the 200s. She denies any tobacco use.  In the ED, she was afebrile normotensive on room air.  Mild leukocytosis of 12.7, normal hemoglobin.Sodium of 136, K of 3.7, creatinine of 1.1 with previous around 1.2.  Glucose of 287.  CT head was negative.EKG with sinus tachycardia and occasional PVCs with baseline artifact.  Review of Systems: As mentioned in the history of present illness. All other systems reviewed and are negative. Past Medical History:  Diagnosis Date   Anemia    hx    Anxiety    no meds   Arthritis    knees   Cancer ([place])    endometrial cancer   Diabetes mellitus without complication ([place])    on oral medication   GERD (gastroesophageal reflux disease)    pepcid now/famotidine   Hypertension    SVD (spontaneous vaginal delivery)    x 1   UTI (lower urinary tract infection) 11/22/2011   started abx on 11/24/11   Past Surgical History:  Procedure Laterality Date   CHOLECYSTECTOMY     DILATATION & CURRETTAGE/HYSTEROSCOPY WITH RESECTOCOPE  11/30/2011   Procedure: [place];  Surgeon: Peri Maris,  MD;  Location: [facility];  Service: Gynecology;  Laterality: N/A;   DILATION AND CURETTAGE OF UTERUS     LYMPH NODE DISSECTION  12/14/2011   Procedure: LYMPH NODE DISSECTION;  Surgeon: Imagene Gurney A. Alycia Rossetti, MD;  Location: [facility];  Service: Gynecology;  Laterality: N/A;   ROBOTIC ASSISTED TOTAL HYSTERECTOMY WITH BILATERAL SALPINGO OOPHERECTOMY  12/14/2011   Procedure: ROBOTIC ASSISTED TOTAL HYSTERECTOMY WITH BILATERAL SALPINGO OOPHORECTOMY;  Surgeon: Imagene Gurney A. Alycia Rossetti, MD;  Location: [facility];  Service: Gynecology;  Laterality: N/A;   WISDOM TOOTH EXTRACTION     Social History:  reports that she has never smoked. She has never used smokeless tobacco. She reports that she does not drink alcohol and does not use drugs.  Allergies  Allergen Reactions   Penicillin G Anaphylaxis   Doxycycline Hyclate    Shrimp [Shellfish Allergy] Nausea And Vomiting   Sinutab Sinus Max St [Phenylephrine-Acetaminophen] Swelling   Valacyclovir Other (See Comments)    Confusion    Family History  Problem Relation Age of Onset   Heart disease Mother    Heart disease Father    Diabetes Sister        decline after fall   Other Sister        old age, delcined after husband loss   Diabetes Sister        later had fall   Cancer Paternal Aunt        started in  arm reportedly    Prior to Admission medications   Medication Sig Start Date End Date Taking? Authorizing Provider  acetaminophen (TYLENOL) 500 MG tablet Take 500 mg by mouth every 6 (six) hours as needed for mild pain or moderate pain.   Yes [provider]  doxazosin (CARDURA) 2 MG tablet Take 1 tablet (2 mg total) by mouth at bedtime. 08/26/20  Yes Marin Olp, MD  famotidine (PEPCID) 20 MG tablet Take 20 mg by mouth daily. 07/01/20  Yes [provider]  glimepiride (AMARYL) 4 MG tablet Take 1 tablet (4 mg total) by mouth 2 (two) times daily. 08/26/20  Yes Marin Olp, MD  mometasone (ELOCON) 0.1 % lotion Apply 1 application topically  daily. 05/28/20  Yes [provider]  triamterene-hydrochlorothiazide (MAXZIDE-25) 37.5-25 MG tablet Take one tablet daily in the morning. 08/29/20  Yes Marin Olp, MD    Physical Exam: Vitals:   03/02/21 1942 03/02/21 2136 03/02/21 2300  BP: (!) 158/69 (!) 144/65 (!) 125/53  Pulse: (!) 107 98 84  Resp: 16 16 20   Temp: 98.6 F (37 C) 98 F (36.7 C)   TempSrc: Oral Oral   SpO2: 97% 98% 96%   Constitutional: NAD, calm, comfortable, elderly female appearing younger than stated age sitting upright in bed appearing anxious Eyes: PERRL, lids and conjunctivae normal ENMT: Mucous membranes are moist. Neck: normal, supple Respiratory: clear to auscultation bilaterally, no wheezing, no crackles. Normal respiratory effort.  Cardiovascular: Regular rate and rhythm, no murmurs / rubs / gallops. No extremity edema.   Abdomen: no tenderness, no masses palpated. No hepatosplenomegaly. Bowel sounds positive.  Musculoskeletal: no clubbing / cyanosis. No joint deformity upper and lower extremities. Good ROM, no contractures. Normal muscle tone.  Skin: no rashes, lesions, ulcers.  Neurologic: CN 2-12 grossly intact. Sensation intact,  Strength 5/5 in all 4. Speech is coherent. Able to name simple objects.  Psychiatric: Normal judgment and insight. Alert and oriented x 3. Normal mood.  Data Reviewed:  EKG on my review with sinus tachycardia and occasional PVCs with baseline artifact  Assessment and Plan:  Aphasia/difficulty word finding concerning for TIA - MRI brain -IV ativan before for anxiety - CTA head and neck - Pt later refused test for fear of IV contrast allergy which she does not currently have.  - obtain echocardiogram  - start daily aspirin  - start atorvastatin if LDL > 70 -Obtain A1c and lipids- Recent HbA1C on 2/9 of 9.4 -PT/OT/SLT -Frequent neuro checks and keep on telemetry -Allow for permissive hypertension with blood pressure treatment as needed only if systolic  goes above 762/831  HTN -hold home meds to allow for permissive hypertension  Type 2 diabetes with hyperglycemia Uncontrolled.  Most recent hemoglobin A1c on 2/9 of 9.4  Start sensitive SSI  GERD Continue famotidine   Advance Care Planning:   Code Status: Full Code   Consults: Neurology  Family Communication: Discussed with patient and husband at bedside.  All questions and concerns were addressed.  Severity of Illness: The appropriate patient status for this patient is OBSERVATION. Observation status is judged to be reasonable and necessary in order to provide the required intensity of service to ensure the patient's safety. The patient's presenting symptoms, physical exam findings, and initial radiographic and laboratory data in the context of their medical condition is felt to place them at decreased risk for further clinical deterioration. Furthermore, it is anticipated that the patient will be medically stable for discharge from the  hospital within 2 midnights of admission.   Author: Orene Desanctis, DO 03/02/2021 11:51 PM  For on call review [email or web address].

## 2021-03-02 NOTE — ED Notes (Signed)
Triage Provider at bedside.

## 2021-03-02 NOTE — ED Triage Notes (Signed)
Pt spouse states that the patient was having a hard time remembering what things were -such as water bottle or vacuum. "Little bit" of slurred speech. Occurred around 1830. Lasted about 10 minutes.   Speech clear, no pain, mae x 4, no numbness or tingling in extremities.

## 2021-03-02 NOTE — ED Provider Triage Note (Signed)
Emergency Medicine Provider Triage Evaluation Note  AWA Meagan [name] , a 76 y.o. female  was evaluated in triage.  Pt complains of episode of difficulty finding her words, difficulty speaking that occurred at 630 and lasted about 10 minutes.  Patient during this episode denies facial droop, weakness, chest pain, shortness of breath, or any other complaints.  She does have history of hypertension, diabetes.  Husband is at bedside who assists in providing history.  States that she is back to her baseline.  Review of Systems  Positive: As above Negative: As above  Physical Exam  BP (!) 158/69 (BP Location: Left Arm)    Pulse (!) 107    Temp 98.6 F (37 C) (Oral)    Resp 16    LMP 01/18/2001    SpO2 97%  Gen:   Awake, no distress   Resp:  Normal effort  MSK:   Moves extremities without difficulty  Other:  Bilateral upper extremities with 5/5 strength.  Without pronator drift.  Bilateral lower extremities with 5/5 strength.  Sensation intact in upper and lower extremities.  Cranial nerves II through XII intact.   Medical Decision Making  Medically screening exam initiated at 7:59 PM.  Appropriate orders placed.  CLAY MENSER was informed that the remainder of the evaluation will be completed by another provider, this initial triage assessment does not replace that evaluation, and the importance of remaining in the ED until their evaluation is complete.     Evlyn Courier, PA-C 03/02/21 2000

## 2021-03-02 NOTE — ED Provider Notes (Signed)
[facility] EMERGENCY DEPARTMENT Provider Note   CSN: [id number] Arrival date & time: 03/02/21  1938     History  Chief Complaint  Patient presents with   Aphasia    Meagan [name] is a 76 y.o. female.  The history is provided by the patient and a relative.  Neurologic Problem This is a new problem. The current episode started 3 to 5 hours ago. The problem occurs constantly. The problem has been resolved. Pertinent negatives include no chest pain, no abdominal pain, no headaches and no shortness of breath. Nothing aggravates the symptoms. Nothing relieves the symptoms. She has tried nothing for the symptoms.  Patient w/history of diabetes, hypertension presents with concern for stroke. At approximately 6:30 PM she began having difficulty speaking.  She had difficulty with word finding and family thought that she appeared confused.  There was no focal weakness.  It lasted for least 10 minutes.  Son reports that when he arrived she still appeared mildly confused but that has resolved. She has never had the symptoms previously   Past Medical History:  Diagnosis Date   Anemia    hx    Anxiety    no meds   Arthritis    knees   Cancer ([place])    endometrial cancer   Diabetes mellitus without complication ([place])    on oral medication   GERD (gastroesophageal reflux disease)    pepcid now/famotidine   Hypertension    SVD (spontaneous vaginal delivery)    x 1   UTI (lower urinary tract infection) 11/22/2011   started abx on 11/24/11    Home Medications Prior to Admission medications   Medication Sig Start Date End Date Taking? Authorizing Provider  doxazosin (CARDURA) 2 MG tablet Take 1 tablet (2 mg total) by mouth at bedtime. 08/26/20   Marin Olp, MD  famotidine (PEPCID) 20 MG tablet  07/01/20   [provider]  glimepiride (AMARYL) 4 MG tablet Take 1 tablet (4 mg total) by mouth 2 (two) times daily. 08/26/20   Marin Olp, MD  mometasone  (ELOCON) 0.1 % lotion  05/28/20   [provider]  triamterene-hydrochlorothiazide (MAXZIDE-25) 37.5-25 MG tablet Take one tablet daily in the morning. 08/29/20   Marin Olp, MD      Allergies    Penicillin g, Doxycycline hyclate, Shrimp [shellfish allergy], Sinutab sinus max st [phenylephrine-acetaminophen], and Valacyclovir    Review of Systems   Review of Systems  Constitutional:  Negative for fever.  Eyes:  Negative for visual disturbance.  Respiratory:  Negative for shortness of breath.   Cardiovascular:  Negative for chest pain.  Gastrointestinal:  Negative for abdominal pain.  Neurological:  Positive for speech difficulty. Negative for weakness, numbness and headaches.  Psychiatric/Behavioral:  Positive for confusion.   All other systems reviewed and are negative.  Physical Exam Updated Vital Signs BP (!) 158/69 (BP Location: Left Arm)    Pulse (!) 107    Temp 98.6 F (37 C) (Oral)    Resp 16    LMP 01/18/2001    SpO2 97%  Physical Exam CONSTITUTIONAL: elderly/no distress HEAD: Normocephalic/atraumatic EYES: EOMI/PERRL, no nystagmus, no visual field deficit  no ptosis ENMT: Mucous membranes moist NECK: supple no meningeal signs, no bruits CV: S1/S2 noted, no murmurs/rubs/gallops noted LUNGS: Lungs are clear to auscultation bilaterally, no apparent distress ABDOMEN: soft, nontender, no rebound or guarding GU:no cva tenderness NEURO:Awake/alert, face symmetric, no arm or leg drift is noted No slurred  speech, no dysarthria, no aphasia noted Equal 5/5 strength with shoulder abduction, elbow flex/extension, wrist flex/extension in upper extremities and equal hand grips bilaterally Equal 5/5 strength with hip flexion,knee flex/extension, foot dorsi/plantar flexion Cranial nerves 3/4/5/6/07/26/08/11/12 tested and intact Sensation to light touch intact in all extremities EXTREMITIES: pulses normal, full ROM SKIN: warm, color normal PSYCH: no abnormalities of mood  noted  ED Results / Procedures / Treatments   Labs (all labs ordered are listed, but only abnormal results are displayed) Labs Reviewed  CBC - Abnormal; Notable for the following components:      Result Value   WBC 12.7 (*)    All other components within normal limits  DIFFERENTIAL - Abnormal; Notable for the following components:   Neutro Abs 9.1 (*)    Abs Immature Granulocytes 0.12 (*)    All other components within normal limits  COMPREHENSIVE METABOLIC PANEL - Abnormal; Notable for the following components:   Sodium 133 (*)    Glucose, Bld 292 (*)    Creatinine, Ser 1.22 (*)    Albumin 3.3 (*)    GFR, Estimated 47 (*)    All other components within normal limits  CBG MONITORING, ED - Abnormal; Notable for the following components:   Glucose-Capillary 298 (*)    All other components within normal limits  I-STAT CHEM 8, ED - Abnormal; Notable for the following components:   BUN 25 (*)    Creatinine, Ser 1.10 (*)    Glucose, Bld 287 (*)    Calcium, Ion 1.11 (*)    All other components within normal limits  I-STAT BETA HCG BLOOD, ED ([facility], [facility], [facility] ONLY) - Abnormal; Notable for the following components:   I-stat hCG, quantitative 6.7 (*)    All other components within normal limits  RESP PANEL BY RT-PCR (FLU A&B, COVID) ARPGX2  PROTIME-INR  APTT  CBG MONITORING, ED    EKG EKG Interpretation  Date/Time:  Monday March 02 2021 19:35:39 EST Ventricular Rate:  107 PR Interval:  188 QRS Duration: 78 QT Interval:  320 QTC Calculation: 427 R Axis:   27 Text Interpretation: Sinus tachycardia with occasional Premature ventricular complexes Possible Inferior infarct , age undetermined Anterior infarct , age undetermined Abnormal ECG Interpretation limited secondary to artifact Confirmed by Ripley Fraise ([id number]) on 03/02/2021 8:58:44 PM  Radiology CT HEAD WO CONTRAST  Result Date: 03/02/2021 CLINICAL DATA:  Transient ischemic attack. EXAM: CT HEAD WITHOUT CONTRAST  TECHNIQUE: Contiguous axial images were obtained from the base of the skull through the vertex without intravenous contrast. RADIATION DOSE REDUCTION: This exam was performed according to the departmental dose-optimization program which includes automated exposure control, adjustment of the mA and/or kV according to patient size and/or use of iterative reconstruction technique. COMPARISON:  None. FINDINGS: Brain: No acute intracranial hemorrhage, midline shift or mass effect. No extra-axial fluid collection. Gray-white matter differentiation is within normal limits. No hydrocephalus. Minimal periventricular white matter hypodensities are noted bilaterally. Vascular: No hyperdense vessel or unexpected calcification. Skull: Normal. Negative for fracture or focal lesion. Sinuses/Orbits: Mild mucosal thickening in the left maxillary sinus. No acute orbital abnormality. Other: None. IMPRESSION: 1. No acute intracranial hemorrhage. 2. Mild chronic microvascular ischemic changes. Electronically Signed   By: Brett Fairy M.D.   On: 03/02/2021 21:26    Procedures Procedures    Medications Ordered in ED Medications  sodium chloride flush (NS) 0.9 % injection 3 mL (has no administration in time range)    ED Course/ Medical Decision Making/ A&P  Clinical Course as of 03/02/21 2239  Mon Mar 02, 2021  2059 Glucose(!): 287 Hyperglycemia [DW]  2059 WBC(!): 12.7 Mild leukocytosis [DW]  2239 Discussed with Dr. Flossie Buffy for admission. [DW]    Clinical Course User Index [DW] Ripley Fraise, MD     ABCD2 Score: 5                     Medical Decision Making Amount and/or Complexity of Data Reviewed Labs:  Decision-making details documented in ED Course.  Risk Decision regarding hospitalization.   This patient presents to the ED for concern of aphasia and confusion, this involves an extensive number of treatment options, and is a complaint that carries with it a high risk of complications and morbidity.  The  differential diagnosis includes intracranial hemorrhage, CVA, brain tumor, electrolyte abnormality  Comorbidities that complicate the patient evaluation: Patients presentation is complicated by their history of diabetes  Additional history obtained: Additional history obtained from family-son Records reviewed Primary Care Documents  Lab Tests: I Ordered, and personally interpreted labs.  The pertinent results include: Hyperglycemia  Imaging Studies ordered: I ordered imaging studies including CT scan head I independently visualized and interpreted imaging which showed no acute findings I agree with the radiologist interpretation  Cardiac Monitoring: The patient was maintained on a cardiac monitor.  I personally viewed and interpreted the cardiac monitor which showed an underlying rhythm of:  sinus rhythm   Test Considered: Patient is high risk for stroke given medical conditions  Critical Interventions:       Neurology consultation  Consultations Obtained: I requested consultation with the admitting physician Dr. Flossie Buffy and consultant Dr. Lorrin Goodell, and discussed  findings as well as pertinent plan - they recommend: admission  Reevaluation: After the interventions noted above, I reevaluated the patient and found that they have :stayed the same  Complexity of problems addressed: Patients presentation is most consistent with  acute presentation with potential threat to life or bodily function      Disposition: After consideration of the diagnostic results and the patients response to treatment,  I feel that the patent would benefit from admission  .    10:40 PM Patient last known well around 6:30 PM but all symptoms have resolved. However given medical history and elevated ABCD 2 score recommend admission for TIA workup        Final Clinical Impression(s) / ED Diagnoses Final diagnoses:  TIA (transient ischemic attack)    Rx / DC Orders ED Discharge Orders      None         Ripley Fraise, MD 03/02/21 2241

## 2021-03-03 ENCOUNTER — Observation Stay (HOSPITAL_COMMUNITY): Payer: Medicare HMO

## 2021-03-03 ENCOUNTER — Observation Stay (HOSPITAL_BASED_OUTPATIENT_CLINIC_OR_DEPARTMENT_OTHER): Payer: Medicare HMO

## 2021-03-03 ENCOUNTER — Other Ambulatory Visit (HOSPITAL_COMMUNITY): Payer: Medicare HMO

## 2021-03-03 ENCOUNTER — Encounter (HOSPITAL_COMMUNITY): Payer: Self-pay | Admitting: Family Medicine

## 2021-03-03 DIAGNOSIS — G459 Transient cerebral ischemic attack, unspecified: Secondary | ICD-10-CM

## 2021-03-03 DIAGNOSIS — I619 Nontraumatic intracerebral hemorrhage, unspecified: Secondary | ICD-10-CM | POA: Diagnosis not present

## 2021-03-03 DIAGNOSIS — Z8673 Personal history of transient ischemic attack (TIA), and cerebral infarction without residual deficits: Secondary | ICD-10-CM | POA: Diagnosis not present

## 2021-03-03 DIAGNOSIS — R4701 Aphasia: Secondary | ICD-10-CM

## 2021-03-03 DIAGNOSIS — K219 Gastro-esophageal reflux disease without esophagitis: Secondary | ICD-10-CM

## 2021-03-03 LAB — LIPID PANEL
Cholesterol: 151 mg/dL (ref 0–200)
HDL: 44 mg/dL (ref >40)
LDL Cholesterol: 79 mg/dL (ref 0–99)
Total CHOL/HDL Ratio: 3.4 RATIO
Triglycerides: 140 mg/dL (ref <150)
VLDL: 28 mg/dL (ref 0–40)

## 2021-03-03 LAB — HEMOGLOBIN A1C
Hgb A1c MFr Bld: 8.9 % — ABNORMAL HIGH (ref 4.8–5.6)
Mean Plasma Glucose: 208.73 mg/dL

## 2021-03-03 LAB — URINALYSIS, MICROSCOPIC ONLY

## 2021-03-03 LAB — CBG MONITORING, ED
Glucose-Capillary: 241 mg/dL — ABNORMAL HIGH (ref 70–99)
Glucose-Capillary: 197 mg/dL — ABNORMAL HIGH (ref 70–99)

## 2021-03-03 LAB — ECHOCARDIOGRAM COMPLETE
AR max vel: 2.51 cm2
AV Peak grad: 6.1 mmHg
Ao pk vel: 1.24 m/s
Area-P 1/2: 3.45 cm2
S' Lateral: 2.3 cm

## 2021-03-03 IMAGING — MR MR HEAD W/O CM
1 series · 28 of 28 positions shown · non-contrast
Comparison: None.

CLINICAL DATA: Transient ischemic attack

EXAM:
MRI HEAD WITHOUT CONTRAST
TECHNIQUE: Multiplanar, multiecho pulse sequences of the brain and surrounding
structures were obtained without intravenous contrast.

[Series 5: T2 · axial · 5.0mm · 0.75mm/px · z∈[-81,+80]mm · 28 of 28 slices shown]
[im 1/28]
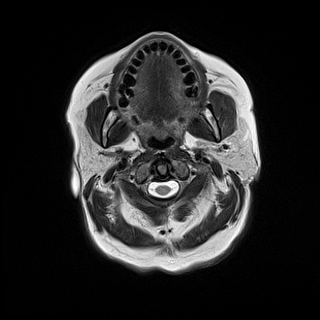
[im 2/28]
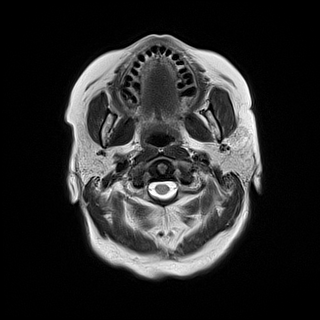
[im 3/28]
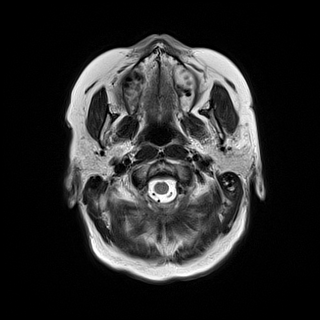
[im 4/28]
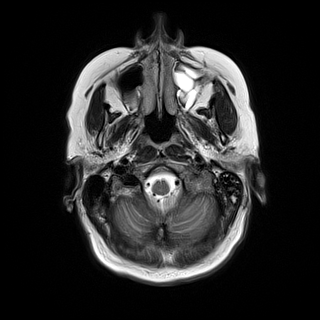
[im 5/28]
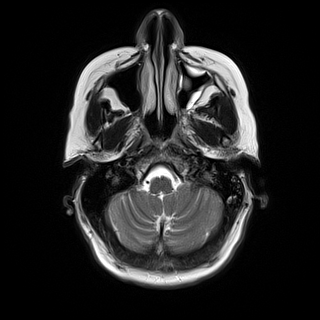
[im 6/28]
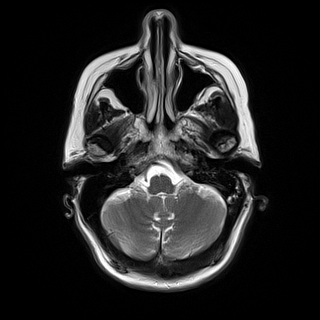
[im 7/28]
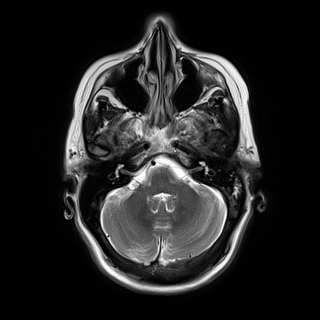
[im 8/28]
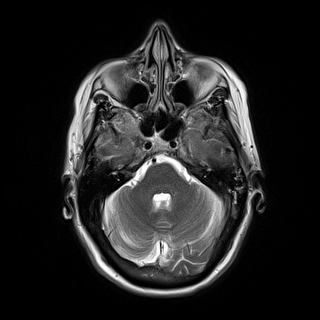
[im 9/28]
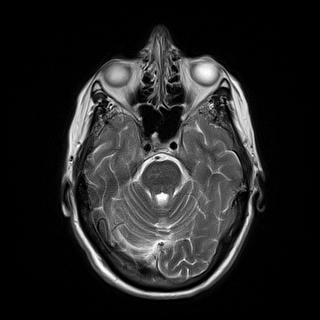
[im 10/28]
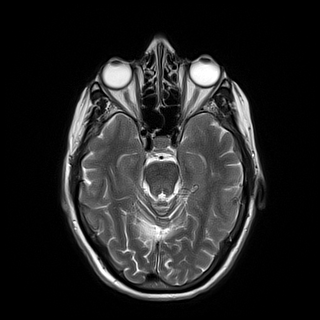
[im 11/28]
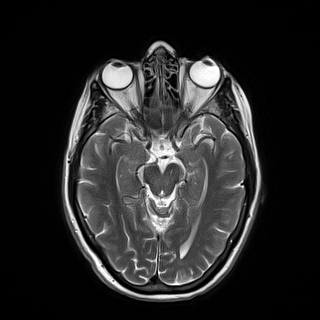
[im 12/28]
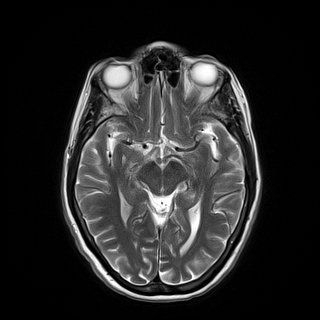
[im 13/28]
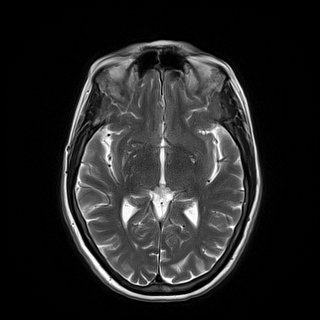
[im 14/28]
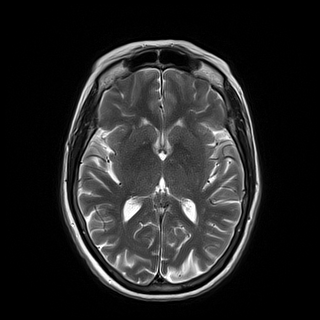
[im 15/28]
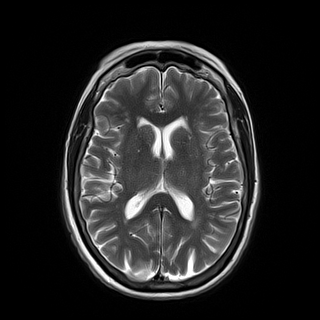
[im 16/28]
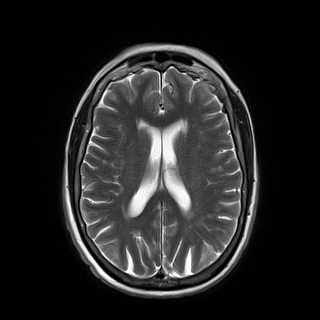
[im 17/28]
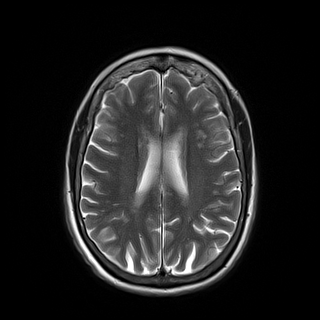
[im 18/28]
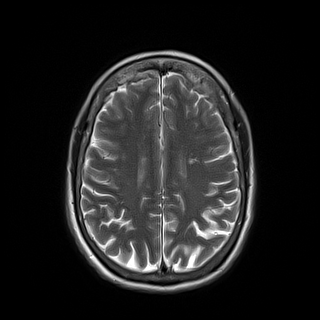
[im 19/28]
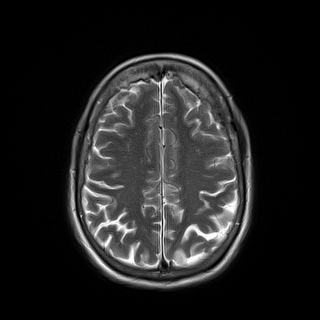
[im 20/28]
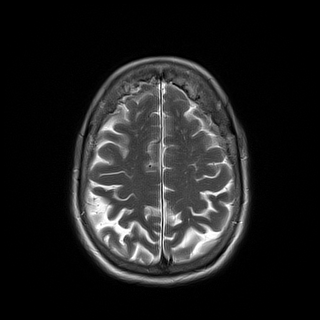
[im 21/28]
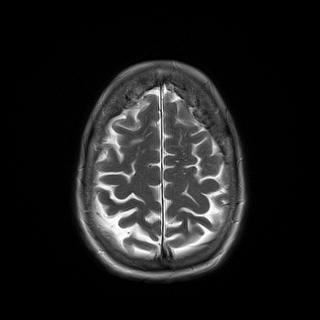
[im 22/28]
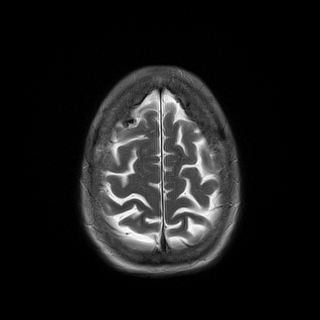
[im 23/28]
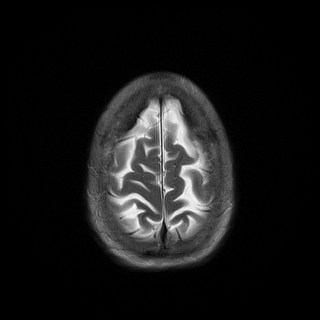
[im 24/28]
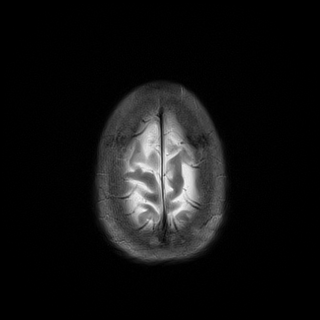
[im 25/28]
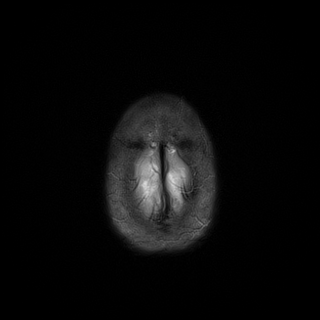
[im 26/28]
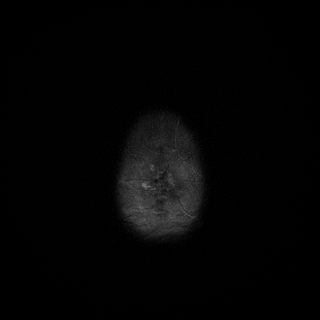
[im 27/28]
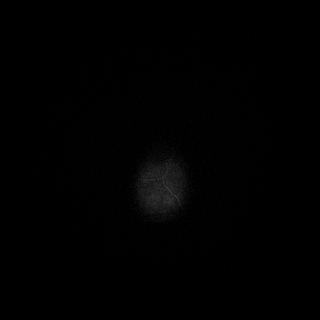
[im 28/28]
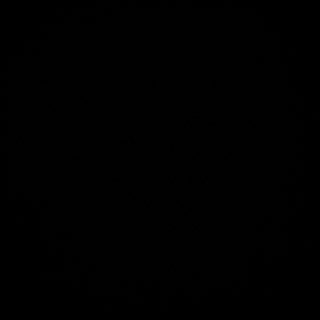

[28 of 28 positions shown; findings below may reference images not displayed]

FINDINGS: Brain: No acute infarct, mass effect or extra-axial collection. No
acute or chronic hemorrhage. There is multifocal hyperintense
T2-weighted signal within the white matter. Parenchymal volume and
CSF spaces are normal. The midline structures are normal.

Vascular: Major flow voids are preserved.

Skull and upper cervical spine: Multilevel mild-to-moderate cervical
spinal canal stenosis, incompletely visualized.

Sinuses/Orbits:No paranasal sinus fluid levels or advanced mucosal
thickening. No mastoid or middle ear effusion. Normal orbits.
IMPRESSION: 1. No acute intracranial abnormality.
2. Findings of chronic small vessel ischemia.
3. Multilevel mild-to-moderate cervical spinal canal stenosis,
incompletely visualized. Recommend correlation with cervical spine
MRI.

## 2021-03-03 IMAGING — MR MR HEAD W/O CM
11 of 16 series · 35 of 48 positions shown · non-contrast
Comparison: None.

CLINICAL DATA: Transient ischemic attack

EXAM:
MRI HEAD WITHOUT CONTRAST
TECHNIQUE: Multiplanar, multiecho pulse sequences of the brain and surrounding
structures were obtained without intravenous contrast.

[Series 5: DWI · coronal · 4.0mm · 0.88mm/px · 4 of 84 slices shown (1 of 4)]
[im 1/84]
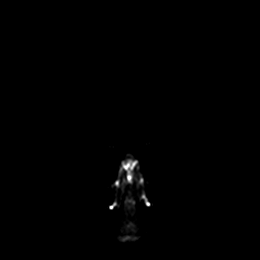
[im 28/84]
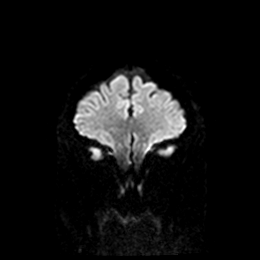
[im 56/84]
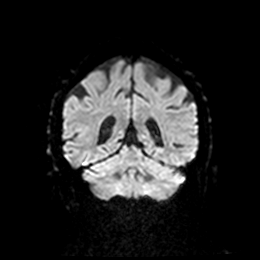
[im 84/84]
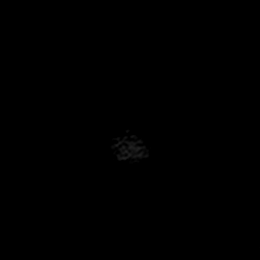

[Series 6: DWI · coronal · 4.0mm · 0.88mm/px · 3 of 42 slices shown (2 of 4)]
[im 1/42]
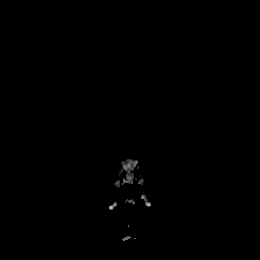
[im 21/42]
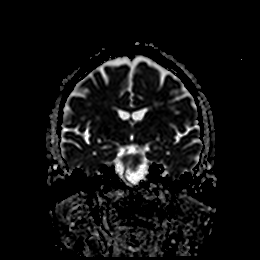
[im 42/42]
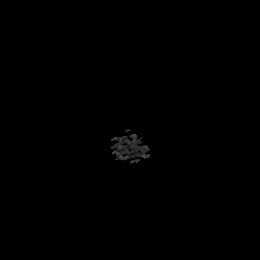

[Series 7: DWI · axial · 3.0mm · 0.92mm/px · z∈[-79,+82]mm · 7 of 110 slices shown (3 of 4)]
[im 1/110]
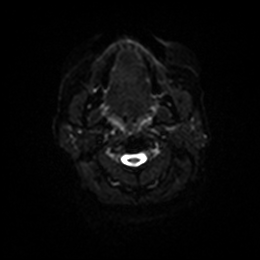
[im 19/110]
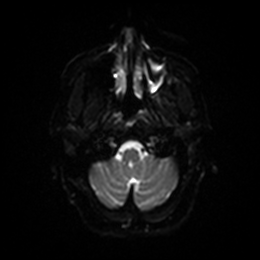
[im 37/110]
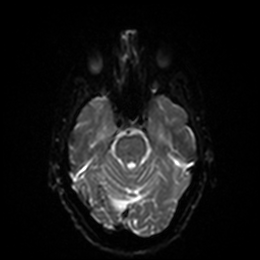
[im 55/110]
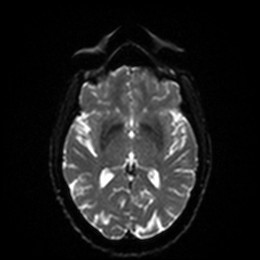
[im 73/110]
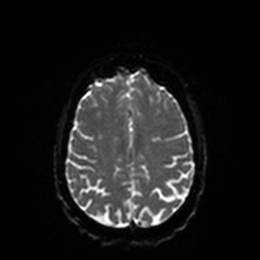
[im 91/110]
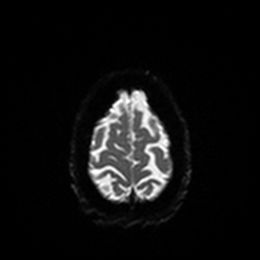
[im 110/110]
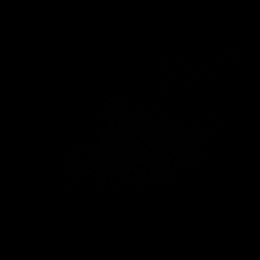

[Series 8: DWI · axial · 3.0mm · 0.92mm/px · z∈[-79,+79]mm · 3 of 54 slices shown (4 of 4)]
[im 1/54]
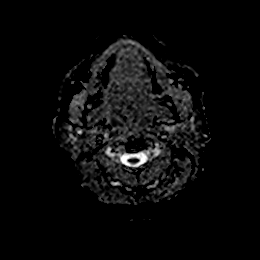
[im 27/54]
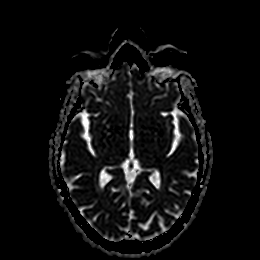
[im 54/54]
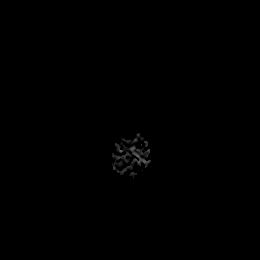

[Series 9: T1 · sagittal · 5.0mm · 0.78mm/px · 2 of 28 slices shown]
[im 1/28]
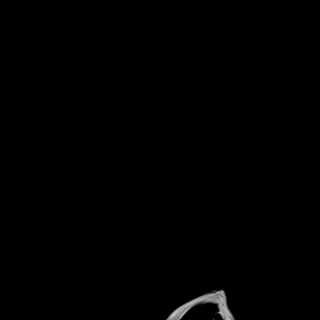
[im 28/28]
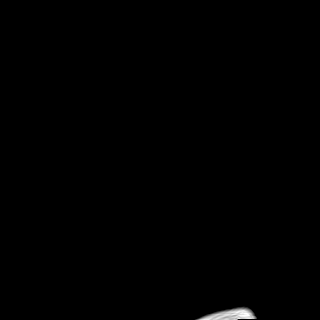

[Series 10: FLAIR · axial · 5.0mm · 0.47mm/px · z∈[-84,+83]mm · 2 of 29 slices shown]
[im 1/29]
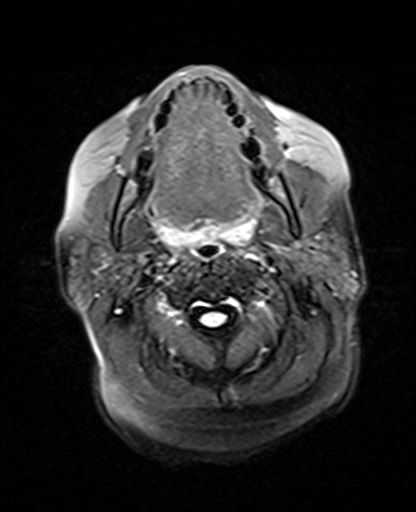
[im 29/29]
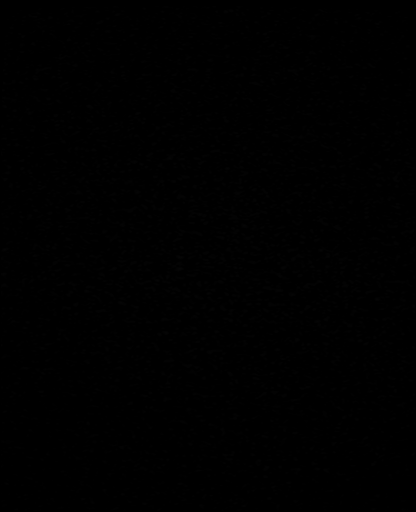

[Series 11: mag_images · axial · 3.0mm · 0.94mm/px · z∈[-82,+82]mm · 3 of 56 slices shown]
[im 1/56]
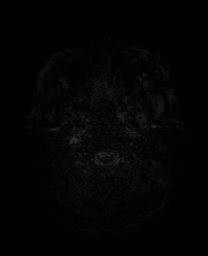
[im 28/56]
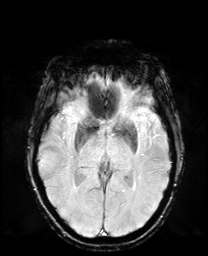
[im 56/56]
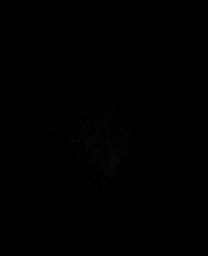

[Series 12: pha_images · axial · 3.0mm · 0.94mm/px · z∈[-82,+82]mm · 3 of 55 slices shown]
[im 1/55]
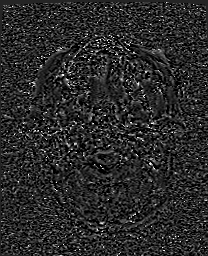
[im 28/55]
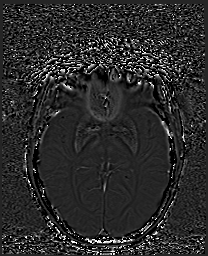
[im 55/55]
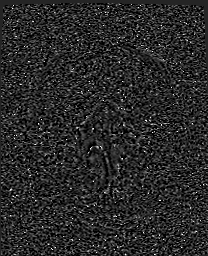

[Series 13: swi_images · axial · 3.0mm · 0.94mm/px · z∈[-82,+82]mm · 3 of 56 slices shown]
[im 1/56]
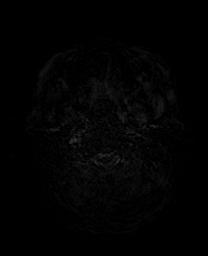
[im 28/56]
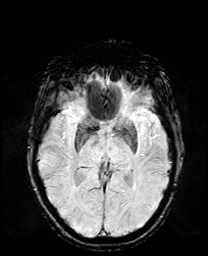
[im 56/56]
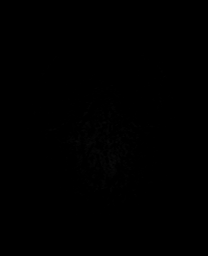

[Series 14: mip_images(sw) · axial · 24.0mm · 0.94mm/px · z∈[-72,+71]mm · 3 of 49 slices shown]
[im 1/49]
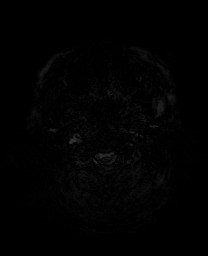
[im 25/49]
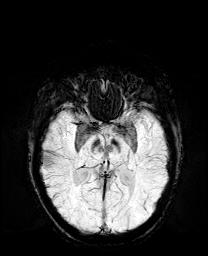
[im 49/49]
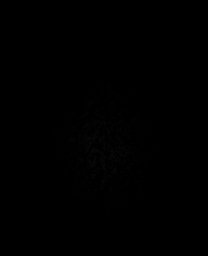

[Series 16: T2 · coronal · 5.0mm · 0.34mm/px · 2 of 35 slices shown]
[im 1/35]
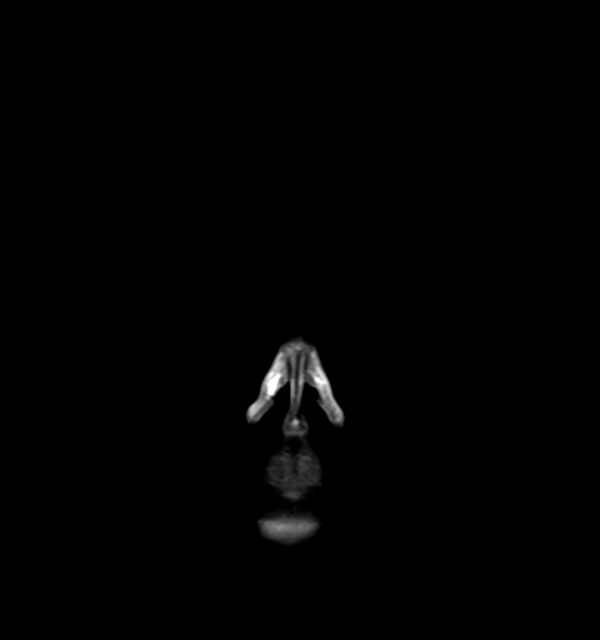
[im 35/35]
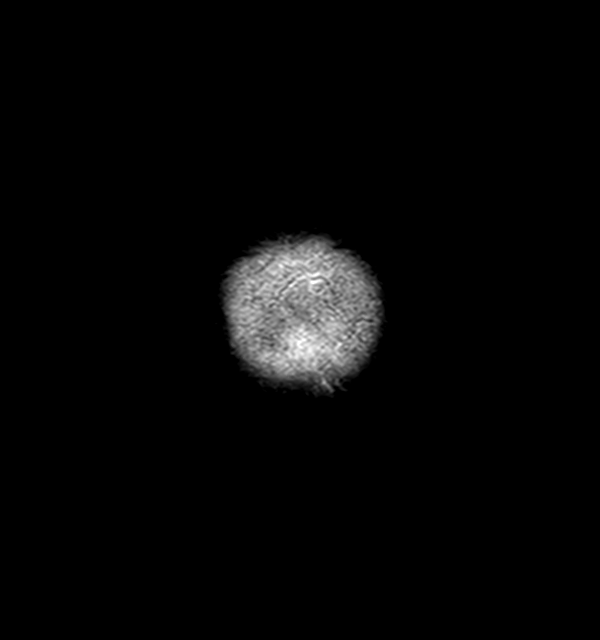

[35 of 48 positions shown; findings below may reference images not displayed]

FINDINGS: Brain: No acute infarct, mass effect or extra-axial collection. No
acute or chronic hemorrhage. There is multifocal hyperintense
T2-weighted signal within the white matter. Parenchymal volume and
CSF spaces are normal. The midline structures are normal.

Vascular: Major flow voids are preserved.

Skull and upper cervical spine: Multilevel mild-to-moderate cervical
spinal canal stenosis, incompletely visualized.

Sinuses/Orbits:No paranasal sinus fluid levels or advanced mucosal
thickening. No mastoid or middle ear effusion. Normal orbits.
IMPRESSION: 1. No acute intracranial abnormality.
2. Findings of chronic small vessel ischemia.
3. Multilevel mild-to-moderate cervical spinal canal stenosis,
incompletely visualized. Recommend correlation with cervical spine
MRI.

## 2021-03-03 MED ORDER — BLOOD PRESSURE KIT: 1.0000 | PACK | Freq: Once | 0 refills | Status: AC

## 2021-03-03 MED ORDER — INSULIN ASPART 100 UNIT/ML IJ SOLN
0.0000 [IU] | Freq: Three times a day (TID) | INTRAMUSCULAR | Status: DC
  Administered 2021-03-03: 3 [IU] via SUBCUTANEOUS

## 2021-03-03 MED ORDER — FAMOTIDINE 20 MG PO TABS
20.0000 mg | ORAL_TABLET | Freq: Every day | ORAL | Status: DC
  Administered 2021-03-03: 20 mg via ORAL
  Filled 2021-03-03: qty 1

## 2021-03-03 MED ORDER — ACETAMINOPHEN 500 MG PO TABS: 500.0000 mg | ORAL_TABLET | Freq: Four times a day (QID) | ORAL | Status: DC | PRN

## 2021-03-03 NOTE — Procedures (Signed)
History: 76 yo being evaluated for aphasia  Sedation: none  Technique: This EEG was acquired with electrodes placed according to the International 10-20 electrode system (including Fp1, Fp2, F3, F4, C3, C4, P3, P4, O1, O2, T3, T4, T5, T6, A1, A2, Fz, Cz, Pz). There is high impedance and electode artifact at times.  The following electrodes were missing or displaced: none.   Background: The background consists of intermixed alpha and beta activities. There is a well defined posterior dominant rhythm of 8-9 Hz that attenuates with eye opening. With drowsiness, there is increased slow activity.   Photic stimulation: Physiologic driving is not perofrmed  EEG Abnormalities: None  Clinical Interpretation: This normal EEG is recorded in the waking and drowsy state. There was no seizure or seizure predisposition recorded on this study. Please note that lack of epileptiform activity on EEG does not preclude the possibility of epilepsy.   Roland Rack, MD [facility] [phone]  If 7pm- 7am, please page neurology on call as listed in [place].

## 2021-03-03 NOTE — Progress Notes (Signed)
TCD and Carotid artery duplex completed. Refer to "CV Proc" under chart review to view preliminary results.  03/03/2021 12:40 PM Kelby Aline., MHA, RVT, RDCS, RDMS

## 2021-03-03 NOTE — Progress Notes (Signed)
Echocardiogram 2D Echocardiogram has been performed.  Meagan [name] 03/03/2021, 8:38 AM

## 2021-03-03 NOTE — ED Notes (Signed)
Admitting at bedside 

## 2021-03-03 NOTE — Evaluation (Signed)
Physical Therapy Evaluation Patient Details Name: Meagan [name] MRN: [id number] DOB: 1945/02/09 Today's Date: 03/03/2021  History of Present Illness  Patient is a 76 y/o female presenting on 03/02/21 with bout of aphasia. Imaging is negative. PMH includes DM2, GERD, UTI, endometrial CA, anemia, anxiety, and arthritis.  Clinical Impression  Patient presents with the problem above and impairments listed below. Patient's BP in supine was 88/77, however, pt asymptomatic. BP taken in sitting and was 110/72. Patient was modified independent with bed mobility and needed  supervision to min guard for transfers and gait. Pt performed DGI tasks and noted one very mild LOB with vertical head turns, however, patient able to regain her balance with min guard assist for safety. Patient has adequate family support and reports at baseline. Patient no longer has word finding difficulties and was able to recognize objects when asked to name them in room. No further PT needs at this time. PT will sign off. If pt needs change, please reconsult.       Recommendations for follow up therapy are one component of a multi-disciplinary discharge planning process, led by the attending physician.  Recommendations may be updated based on patient status, additional functional criteria and insurance authorization.  Follow Up Recommendations No PT follow up    Assistance Recommended at Discharge PRN  Patient can return home with the following  Assist for transportation    Equipment Recommendations None recommended by PT  Recommendations for Other Services       Functional Status Assessment Patient has not had a recent decline in their functional status     Precautions / Restrictions Precautions Precautions: Fall Restrictions Weight Bearing Restrictions: No      Mobility  Bed Mobility Overal bed mobility: Modified Independent             General bed mobility comments: able to sit on EOB with increased  time    Transfers Overall transfer level: Needs assistance Equipment used: None Transfers: Sit to/from Stand Sit to Stand: Supervision           General transfer comment: Pt stated she felt stiff and had 'sea legs' upon standing just from laying down for such a long time.    Ambulation/Gait Ambulation/Gait assistance: Supervision, Min guard (for safety) Gait Distance (Feet): 150 Feet Assistive device: None Gait Pattern/deviations: WFL(Within Functional Limits) Gait velocity: community ambulator Gait velocity interpretation: >2.62 ft/sec, indicative of community ambulatory Pre-gait activities: marching in place General Gait Details: Patient walked with supervision in the hallway during DGI tasks. Needed min guard assist for safety when she had one minor LOB with vertical head turns.  Stairs            Wheelchair Mobility    Modified Rankin (Stroke Patients Only)       Balance Overall balance assessment: Needs assistance Sitting-balance support: Feet supported, No upper extremity supported Sitting balance-Leahy Scale: Good Sitting balance - Comments: needed supervision while patient put her boots on before standing   Standing balance support: No upper extremity supported, During functional activity Standing balance-Leahy Scale: Good Standing balance comment: needed increased time upon standing to gain her bearings with supervision for safety. Slight sway noted, however, decreased the longer she stood.                 Standardized Balance Assessment Standardized Balance Assessment : Dynamic Gait Index   Dynamic Gait Index Level Surface: Normal Change in Gait Speed: Normal Gait with Horizontal Head Turns: Normal Gait with Vertical  Head Turns: Mild Impairment (1 mild LOB however patient able to recover with min guard assist for safety) Gait and Pivot Turn: Normal Step Over Obstacle: Normal Step Around Obstacles: Normal       Pertinent Vitals/Pain  Pain Assessment Pain Assessment: No/denies pain    Home Living Family/patient expects to be discharged to:: Private residence Living Arrangements: Spouse/significant other Available Help at Discharge: Available 24 hours/day Type of Home: House Home Access: Stairs to enter Entrance Stairs-Rails: None Entrance Stairs-Number of Steps: 1 (threshold step) Alternate Level Stairs-Number of Steps: flight Home Layout: Multi-level Home Equipment: None      Prior Function Prior Level of Function : Independent/Modified Independent             Mobility Comments: no ADs or assist required prior to ED stay ADLs Comments: able to perform all ADLs independently     Hand Dominance        Extremity/Trunk Assessment   Upper Extremity Assessment Upper Extremity Assessment: Overall WFL for tasks assessed    Lower Extremity Assessment Lower Extremity Assessment: Generalized weakness (from hospital stay)    Cervical / Trunk Assessment Cervical / Trunk Assessment: Normal  Communication   Communication: No difficulties (HOH without hearing aides)  Cognition Arousal/Alertness: Awake/alert Behavior During Therapy: WFL for tasks assessed/performed Overall Cognitive Status: Within Functional Limits for tasks assessed                                          General Comments General comments (skin integrity, edema, etc.): Supine BP 88/77. BP taken in sitting and raised to 110/72. Patient never complained of dizziness.    Exercises     Assessment/Plan    PT Assessment Patient does not need any further PT services  PT Problem List         PT Treatment Interventions      PT Goals (Current goals can be found in the Care Plan section)  Acute Rehab PT Goals Patient Stated Goal: to go home PT Goal Formulation: With patient/family Time For Goal Achievement: 03/03/21 Potential to Achieve Goals: Good    Frequency       Co-evaluation               AM-PAC  PT "6 Clicks" Mobility  Outcome Measure Help needed turning from your back to your side while in a flat bed without using bedrails?: None Help needed moving from lying on your back to sitting on the side of a flat bed without using bedrails?: None Help needed moving to and from a bed to a chair (including a wheelchair)?: None Help needed standing up from a chair using your arms (e.g., wheelchair or bedside chair)?: None Help needed to walk in hospital room?: None Help needed climbing 3-5 steps with a railing? : A Little 6 Click Score: 23    End of Session Equipment Utilized During Treatment: Gait belt Activity Tolerance: Patient tolerated treatment well Patient left: in bed;with call bell/phone within reach;with family/visitor present (on stretcher in ED) Nurse Communication: Mobility status PT Visit Diagnosis: Muscle weakness (generalized) (M62.81)    Time: 0076-2263 PT Time Calculation (min) (ACUTE ONLY): 16 min   Charges:   PT Evaluation $PT Eval Low Complexity: 1 Low          Jonne Ply, SPT  [place] 03/03/2021, 11:41 AM

## 2021-03-03 NOTE — Progress Notes (Signed)
EEG complete - results pending. Due to patient having scalp psoriasis impedances were all very high but balanced. Dr. Leonel Ramsay (reader) made aware

## 2021-03-03 NOTE — ED Notes (Signed)
Patient transported to MRI 

## 2021-03-03 NOTE — Progress Notes (Addendum)
STROKE TEAM PROGRESS NOTE   SUBJECTIVE (INTERVAL HISTORY) Her husband is at the bedside.  Overall her condition is completely resolved.  The patient reports word-finding difficulties, but no dysarthria, weakness, or facial droop.  She reports that this episode lasted approximately 10 minutes and resolved.  She has had 1 other similar episode in the past.  Denies history of seizures, prior strokes and anticoagulation use.  CT head MRI with no acute abnormality.  Mild chronic small vessel disease.    OBJECTIVE CBC:  Recent Labs  Lab 02/26/21 1016 03/02/21 2003 03/02/21 2037  WBC 10.3 12.7*  --   NEUTROABS 6.7 9.1*  --   HGB 13.2 13.6 14.6  HCT 40.3 42.1 43.0  MCV 81.2 82.5  --   PLT 292.0 316  --    Basic Metabolic Panel:  Recent Labs  Lab 02/26/21 1016 03/02/21 2003 03/02/21 2037  NA 138 133* 136  K 4.0 4.0 3.7  CL 100 98 99  CO2 29 23  --   GLUCOSE 235* 292* 287*  BUN 36* 22 25*  CREATININE 1.26* 1.22* 1.10*  CALCIUM 9.6 9.2  --    Lipid Panel:  Recent Labs  Lab 03/03/21 0209  CHOL 151  TRIG 140  HDL 44  CHOLHDL 3.4  VLDL 28  LDLCALC 79   HgbA1c:  Recent Labs  Lab 03/03/21 0209  HGBA1C 8.9*   Urine Drug Screen: No results for input(s): LABOPIA, COCAINSCRNUR, LABBENZ, AMPHETMU, THCU, LABBARB in the last 168 hours.  Alcohol Level No results for input(s): ETH in the last 168 hours.  IMAGING past 24 hours CT HEAD WO CONTRAST  Result Date: 03/02/2021 CLINICAL DATA:  Transient ischemic attack. EXAM: CT HEAD WITHOUT CONTRAST TECHNIQUE: Contiguous axial images were obtained from the base of the skull through the vertex without intravenous contrast. RADIATION DOSE REDUCTION: This exam was performed according to the departmental dose-optimization program which includes automated exposure control, adjustment of the mA and/or kV according to patient size and/or use of iterative reconstruction technique. COMPARISON:  None. FINDINGS: Brain: No acute intracranial  hemorrhage, midline shift or mass effect. No extra-axial fluid collection. Gray-white matter differentiation is within normal limits. No hydrocephalus. Minimal periventricular white matter hypodensities are noted bilaterally. Vascular: No hyperdense vessel or unexpected calcification. Skull: Normal. Negative for fracture or focal lesion. Sinuses/Orbits: Mild mucosal thickening in the left maxillary sinus. No acute orbital abnormality. Other: None. IMPRESSION: 1. No acute intracranial hemorrhage. 2. Mild chronic microvascular ischemic changes. Electronically Signed   By: Brett Fairy M.D.   On: 03/02/2021 21:26   MR BRAIN WO CONTRAST  Result Date: 03/03/2021 CLINICAL DATA:  Transient ischemic attack EXAM: MRI HEAD WITHOUT CONTRAST TECHNIQUE: Multiplanar, multiecho pulse sequences of the brain and surrounding structures were obtained without intravenous contrast. COMPARISON:  None. FINDINGS: Brain: No acute infarct, mass effect or extra-axial collection. No acute or chronic hemorrhage. There is multifocal hyperintense T2-weighted signal within the white matter. Parenchymal volume and CSF spaces are normal. The midline structures are normal. Vascular: Major flow voids are preserved. Skull and upper cervical spine: Multilevel mild-to-moderate cervical spinal canal stenosis, incompletely visualized. Sinuses/Orbits:No paranasal sinus fluid levels or advanced mucosal thickening. No mastoid or middle ear effusion. Normal orbits. IMPRESSION: 1. No acute intracranial abnormality. 2. Findings of chronic small vessel ischemia. 3. Multilevel mild-to-moderate cervical spinal canal stenosis, incompletely visualized. Recommend correlation with cervical spine MRI. Electronically Signed   By: Ulyses Jarred M.D.   On: 03/03/2021 02:47   ECHOCARDIOGRAM COMPLETE  Result  Date: 03/03/2021    ECHOCARDIOGRAM REPORT   Patient Name:   Meagan [name] Date of Exam: 03/03/2021 Medical Rec #:  [id number]        Height:       62.0 in  Accession #:    [id number]       Weight:       162.4 lb Date of Birth:  Feb 21, 1945        BSA:          1.750 m Patient Age:    76 years         BP:           124/58 mmHg Patient Gender: F                HR:           83 bpm. Exam Location:  Inpatient Procedure: 2D Echo Indications:    TIA  History:        Patient has no prior history of Echocardiogram examinations.                 Risk Factors:Hypertension and Diabetes.  Sonographer:    Jefferey Pica Referring Phys: [id number] [place]  1. Left ventricular ejection fraction, by estimation, is 60 to 65%. The left ventricle has normal function. The left ventricle has no regional wall motion abnormalities. Left ventricular diastolic parameters were normal.  2. Right ventricular systolic function is normal. The right ventricular size is normal. Tricuspid regurgitation signal is inadequate for assessing PA pressure.  3. The mitral valve is grossly normal. Trivial mitral valve regurgitation. No evidence of mitral stenosis.  4. The aortic valve is tricuspid. Aortic valve regurgitation is not visualized. No aortic stenosis is present.  5. The inferior vena cava is normal in size with greater than 50% respiratory variability, suggesting right atrial pressure of 3 mmHg. Conclusion(s)/Recommendation(s): Normal biventricular function without evidence of hemodynamically significant valvular heart disease. No intracardiac source of embolism detected on this transthoracic study. Consider a transesophageal echocardiogram to exclude cardiac source of embolism if clinically indicated. FINDINGS  Left Ventricle: Left ventricular ejection fraction, by estimation, is 60 to 65%. The left ventricle has normal function. The left ventricle has no regional wall motion abnormalities. The left ventricular internal cavity size was normal in size. There is  no left ventricular hypertrophy. Left ventricular diastolic parameters were normal. Right Ventricle: The right ventricular  size is normal. No increase in right ventricular wall thickness. Right ventricular systolic function is normal. Tricuspid regurgitation signal is inadequate for assessing PA pressure. Left Atrium: Left atrial size was normal in size. Right Atrium: Right atrial size was normal in size. Pericardium: Trivial pericardial effusion is present. Mitral Valve: The mitral valve is grossly normal. Trivial mitral valve regurgitation. No evidence of mitral valve stenosis. Tricuspid Valve: The tricuspid valve is grossly normal. Tricuspid valve regurgitation is trivial. No evidence of tricuspid stenosis. Aortic Valve: The aortic valve is tricuspid. Aortic valve regurgitation is not visualized. No aortic stenosis is present. Aortic valve peak gradient measures 6.1 mmHg. Pulmonic Valve: The pulmonic valve was grossly normal. Pulmonic valve regurgitation is trivial. No evidence of pulmonic stenosis. Aorta: The aortic root and ascending aorta are structurally normal, with no evidence of dilitation. Venous: The right lower pulmonary vein is normal. The inferior vena cava is normal in size with greater than 50% respiratory variability, suggesting right atrial pressure of 3 mmHg. IAS/Shunts: The atrial septum is grossly normal.  LEFT VENTRICLE PLAX 2D  LVIDd:         4.00 cm   Diastology LVIDs:         2.30 cm   LV e' medial:    4.58 cm/s LV PW:         1.10 cm   LV E/e' medial:  16.6 LV IVS:        1.10 cm   LV e' lateral:   6.45 cm/s LVOT diam:     2.00 cm   LV E/e' lateral: 11.8 LV SV:         68 LV SV Index:   39 LVOT Area:     3.14 cm  RIGHT VENTRICLE             IVC RV S prime:     13.80 cm/s  IVC diam: 1.80 cm TAPSE (M-mode): 2.5 cm LEFT ATRIUM             Index        RIGHT ATRIUM           Index LA diam:        3.50 cm 2.00 cm/m   RA Area:     10.10 cm LA Vol (A2C):   39.6 ml 22.63 ml/m  RA Volume:   19.70 ml  11.26 ml/m LA Vol (A4C):   46.9 ml 26.80 ml/m LA Biplane Vol: 44.1 ml 25.20 ml/m  AORTIC VALVE                  PULMONIC VALVE AV Area (Vmax): 2.51 cm     PV Vmax:       0.87 m/s AV Vmax:        123.67 cm/s  PV Peak grad:  3.0 mmHg AV Peak Grad:   6.1 mmHg LVOT Vmax:      98.90 cm/s LVOT Vmean:     60.200 cm/s LVOT VTI:       0.217 m  AORTA Ao Root diam: 3.30 cm Ao Asc diam:  3.50 cm MITRAL VALVE MV Area (PHT): 3.45 cm    SHUNTS MV Decel Time: 220 msec    Systemic VTI:  0.22 m MV E velocity: 75.80 cm/s  Systemic Diam: 2.00 cm MV A velocity: 98.10 cm/s MV E/A ratio:  0.77 Eleonore Chiquito MD Electronically signed by Eleonore Chiquito MD Signature Date/Time: 03/03/2021/9:34:03 AM    Final      PHYSICAL EXAM Temp:  [98 F (36.7 C)-98.6 F (37 C)] 98 F (36.7 C) (02/13 2136) Pulse Rate:  [77-107] 83 (02/14 0925) Resp:  [15-22] 15 (02/14 0925) BP: (110-158)/(50-72) 110/72 (02/14 0925) SpO2:  [91 %-99 %] 98 % (02/14 0925) Weight:  [74 kg] 74 kg (02/14 0800)  General - Well nourished, well developed, elderly Caucasian woman in no apparent distress.  Cardiovascular - Regular rhythm and rate on telemetry.  Mental Status -  Level of arousal and orientation to time, place, and person were intact. Language including expression, naming, repetition, comprehension was assessed and found intact. Attention span and concentration were normal. Recent and remote memory were with some deficits.  Recall 1/3 Fund of Knowledge was assessed and was with some deficits.  Able to name 5 animals that walk on four legs  Cranial Nerves II - XII - II - Visual field intact OU. III, IV, VI - Extraocular movements intact. VII - Facial movement intact bilaterally. VIII - Hearing & vestibular intact bilaterally.  Motor Strength - The patients strength was normal in all extremities and pronator  drift was absent.  Bulk was normal and fasciculations were absent.    Coordination - The patient had normal movements in the hands and feet with no ataxia or dysmetria.  Tremor was absent.  Gait and Station -  deferred.    ASSESSMENT/PLAN Meagan [name] is a 76 y.o. female with history of  anemia, GERD, DM2, HTN presenting with an episode of word-finding difficulty.   TIA versus  MCI  related mild word finding difficulties Code Stroke CT head No acute abnormality.  Mild chronic small vessel disease. MRI no acute intracranial abnormalities; chronic small vessel ischemia 2D Echo LVEF 60 to 65%; trivial MVR; tricuspid aortic valve EEG with no epileptiform activity/no seizures LDL 79 HgbA1c 8.9 VTE prophylaxis -none    Diet   Diet Heart Room service appropriate? Yes; Fluid consistency: Thin   No antithrombotic prior to admission, now on aspirin 81 mg daily.  Therapy recommendations: None Disposition: Home  Hypertension Home meds:  Triamterene-HCTZ 37.5-25 mg Stable Goal SBP <160 but gradually normalize in 5-7 days Long-term BP goal normotensive  Hyperlipidemia Home meds:  N/A LDL 79, goal < 70 High intensity statin not indicated   Diabetes type II Uncontrolled Home meds:  Glimepiride 4mg  BID HgbA1c 8.9, goal < 7.0 CBGs Recent Labs    03/02/21 1958 03/02/21 2151 03/03/21 0805  GLUCAP 298* 284* 241*    SSI  Other Stroke Risk Factors Advanced Age >/= 73   Other Active Problems N/A  Hospital day # 0    Rosezetta Schlatter, MD Stroke Neurology- Neuro Psych Resident 03/03/2021 9:59 AM  STROKE MD NOTE : I have personally obtained history,examined this patient, reviewed notes, independently viewed imaging studies, participated in medical decision making and plan of care.ROS completed by me personally and pertinent positives fully documented  I have made any additions or clarifications directly to the above note. Agree with note above.  Patient presented with transient word finding difficulties which appear to have resolved.  She denies history of cognitive impairment or memory loss but on exam is positive from mild cognitive impairment.  Brain imaging and neurovascular  studies unremarkable.  Recommend follow-up as an outpatient for further testing follow-up cognitive impairment if interested.  Greater than 50% time during this 50-minute visit was spent on counseling and coordination of care and discussion.  On husband and Dr. Tawanna Solo and answering questions.     Antony Contras, MD Medical Director [facility] Pager: [phone] 03/03/2021 5:42 PM    To contact Stroke Continuity provider, please refer to [email or web address] After hours, contact General Neurology

## 2021-03-03 NOTE — ED Notes (Signed)
Pt pretty upset. Pt stated "that doctor came in here and told me I could leave and now yall are telling me I can't and asking me to do more test. I am beginning to wonder about all of this." This RN messaged the attending doc. This RN will continue to monitor. At this time pt transported to Vascular.

## 2021-03-03 NOTE — Discharge Summary (Signed)
Physician Discharge Summary  [place] PPI:[id number] DOB: 05/18/1945 DOA: 03/02/2021  PCP: Marin Olp, MD  Admit date: 03/02/2021 Discharge date: 03/03/2021  Admitted From: Home Disposition:  Home  Discharge Condition:Stable CODE STATUS:FULL Diet recommendation: Carbohydrate consistenet  Brief/Interim Summary:  Meagan [name] is a 76 y.o. female with medical history significant of HTN, Type 2 DM, CKD stage 3a, HLD, and GERD who presents with trouble word findings.  No report of facial droop or extremity weakness.  Her mental status remained stable.  She was admitted for the suspicion of stroke.  Stroke work-up initiated and completed.  CT head, MRI of the brain did not show any acute intracranial abnormalities.  Echocardiogram showed EF of 60 to 65%, normal left ventricular diastolic parameters, no intracardiac source of emboli.  Carotid Doppler did not show any significant stenosis .PT/OT did not recommend any follow-up.  Neurology is following.  EEG did not show any seizure activity.  She is medically stable for discharge home today.  Following problems were addressed during her hospitalization:  Word finding difficulty/concern for TIA: She presents with trouble word findings.  No report of facial droop or extremity weakness.  Her mental status remained stable.  She was admitted for the suspicion of stroke.  Stroke work-up initiated and completed.  CT head, MRI of the brain did not show any acute intracranial abnormalities.  Echocardiogram showed EF of 60 to 65%, normal left ventricular diastolic parameters, no intracardiac source of emboli.  Carotid Doppler did not show any significant stenosis .PT/OT did not recommend any follow-up.  Neurology is following.  EEG did not show any seizure activity.  Hemoglobin A1c of 8.9, LDL of 79  Uncontrolled diabetes type 2: Patient reports that she has been significantly stressed recently due to her family issues.  She takes glimepiride 4  mg twice a day at home.  We tried to put her on metformin but she says she cannot tolerate it and she wants to follow-up with her primary care physician for the management of her diabetes as an outpatient.  Hypertension: Takes Cardura, Maxide-25 at home.  I have recommended her to monitor her blood pressure at home and follow-up with her primary care physician.  She remained normotensive during this hospitalization  GERD: Famotidine  Discharge Diagnoses:  Principal Problem:   Aphasia Active Problems:   Hypertension associated with diabetes ([place])   Diabetes mellitus, type 2 (HCC)   GERD (gastroesophageal reflux disease)    Discharge Instructions  Discharge Instructions     Diet - low sodium heart healthy   Complete by: As directed    Discharge instructions   Complete by: As directed    1)Please follow up with your primary care physician in  a week.You might need to add another medication for your diabetes. 2)Monitor your blood sugars and blood pressure at home.   Increase activity slowly   Complete by: As directed       Allergies as of 03/03/2021       Reactions   Penicillin G Anaphylaxis   Doxycycline Hyclate    Shrimp [shellfish Allergy] Nausea And Vomiting   Sinutab Sinus Max St [phenylephrine-acetaminophen] Swelling   Valacyclovir Other (See Comments)   Confusion        Medication List     TAKE these medications    acetaminophen 500 MG tablet Commonly known as: TYLENOL Take 500 mg by mouth every 6 (six) hours as needed for mild pain or moderate pain.   Blood Pressure  Kit 1 each by Does not apply route once for 1 dose.   doxazosin 2 MG tablet Commonly known as: CARDURA Take 1 tablet (2 mg total) by mouth at bedtime.   famotidine 20 MG tablet Commonly known as: PEPCID Take 20 mg by mouth daily.   glimepiride 4 MG tablet Commonly known as: AMARYL Take 1 tablet (4 mg total) by mouth 2 (two) times daily.   mometasone 0.1 % lotion Commonly known as:  ELOCON Apply 1 application topically daily.   triamterene-hydrochlorothiazide 37.5-25 MG tablet Commonly known as: MAXZIDE-25 Take one tablet daily in the morning.        Follow-up Information     Marin Olp, MD. Schedule an appointment as soon as possible for a visit in 1 week(s).   Specialty: Family Medicine Contact information: [place] [address] [phone]                Allergies  Allergen Reactions   Penicillin G Anaphylaxis   Doxycycline Hyclate    Shrimp [Shellfish Allergy] Nausea And Vomiting   Sinutab Sinus Max St [Phenylephrine-Acetaminophen] Swelling   Valacyclovir Other (See Comments)    Confusion    Consultations: Neurology   Procedures/Studies: CT HEAD WO CONTRAST  Result Date: 03/02/2021 CLINICAL DATA:  Transient ischemic attack. EXAM: CT HEAD WITHOUT CONTRAST TECHNIQUE: Contiguous axial images were obtained from the base of the skull through the vertex without intravenous contrast. RADIATION DOSE REDUCTION: This exam was performed according to the departmental dose-optimization program which includes automated exposure control, adjustment of the mA and/or kV according to patient size and/or use of iterative reconstruction technique. COMPARISON:  None. FINDINGS: Brain: No acute intracranial hemorrhage, midline shift or mass effect. No extra-axial fluid collection. Gray-white matter differentiation is within normal limits. No hydrocephalus. Minimal periventricular white matter hypodensities are noted bilaterally. Vascular: No hyperdense vessel or unexpected calcification. Skull: Normal. Negative for fracture or focal lesion. Sinuses/Orbits: Mild mucosal thickening in the left maxillary sinus. No acute orbital abnormality. Other: None. IMPRESSION: 1. No acute intracranial hemorrhage. 2. Mild chronic microvascular ischemic changes. Electronically Signed   By: Brett Fairy M.D.   On: 03/02/2021 21:26   MR BRAIN WO  CONTRAST  Result Date: 03/03/2021 CLINICAL DATA:  Transient ischemic attack EXAM: MRI HEAD WITHOUT CONTRAST TECHNIQUE: Multiplanar, multiecho pulse sequences of the brain and surrounding structures were obtained without intravenous contrast. COMPARISON:  None. FINDINGS: Brain: No acute infarct, mass effect or extra-axial collection. No acute or chronic hemorrhage. There is multifocal hyperintense T2-weighted signal within the white matter. Parenchymal volume and CSF spaces are normal. The midline structures are normal. Vascular: Major flow voids are preserved. Skull and upper cervical spine: Multilevel mild-to-moderate cervical spinal canal stenosis, incompletely visualized. Sinuses/Orbits:No paranasal sinus fluid levels or advanced mucosal thickening. No mastoid or middle ear effusion. Normal orbits. IMPRESSION: 1. No acute intracranial abnormality. 2. Findings of chronic small vessel ischemia. 3. Multilevel mild-to-moderate cervical spinal canal stenosis, incompletely visualized. Recommend correlation with cervical spine MRI. Electronically Signed   By: Ulyses Jarred M.D.   On: 03/03/2021 02:47   EEG adult  Result Date: 03/03/2021 Greta Doom, MD     03/03/2021  1:16 PM History: 76 yo being evaluated for aphasia Sedation: none Technique: This EEG was acquired with electrodes placed according to the International 10-20 electrode system (including Fp1, Fp2, F3, F4, C3, C4, P3, P4, O1, O2, T3, T4, T5, T6, A1, A2, Fz, Cz, Pz). There is high impedance and electode artifact  at times.  The following electrodes were missing or displaced: none. Background: The background consists of intermixed alpha and beta activities. There is a well defined posterior dominant rhythm of 8-9 Hz that attenuates with eye opening. With drowsiness, there is increased slow activity. Photic stimulation: Physiologic driving is not perofrmed EEG Abnormalities: None Clinical Interpretation: This normal EEG is recorded in the waking  and drowsy state. There was no seizure or seizure predisposition recorded on this study. Please note that lack of epileptiform activity on EEG does not preclude the possibility of epilepsy. Roland Rack, MD [facility] [phone] If 7pm- 7am, please page neurology on call as listed in [place].   ECHOCARDIOGRAM COMPLETE  Result Date: 03/03/2021    ECHOCARDIOGRAM REPORT   Patient Name:   Meagan [name] Date of Exam: 03/03/2021 Medical Rec #:  [id number]        Height:       62.0 in Accession #:    [id number]       Weight:       162.4 lb Date of Birth:  1945-03-25        BSA:          1.750 m Patient Age:    48 years         BP:           124/58 mmHg Patient Gender: F                HR:           83 bpm. Exam Location:  Inpatient Procedure: 2D Echo Indications:    TIA  History:        Patient has no prior history of Echocardiogram examinations.                 Risk Factors:Hypertension and Diabetes.  Sonographer:    Jefferey Pica Referring Phys: [id number] Capac  1. Left ventricular ejection fraction, by estimation, is 60 to 65%. The left ventricle has normal function. The left ventricle has no regional wall motion abnormalities. Left ventricular diastolic parameters were normal.  2. Right ventricular systolic function is normal. The right ventricular size is normal. Tricuspid regurgitation signal is inadequate for assessing PA pressure.  3. The mitral valve is grossly normal. Trivial mitral valve regurgitation. No evidence of mitral stenosis.  4. The aortic valve is tricuspid. Aortic valve regurgitation is not visualized. No aortic stenosis is present.  5. The inferior vena cava is normal in size with greater than 50% respiratory variability, suggesting right atrial pressure of 3 mmHg. Conclusion(s)/Recommendation(s): Normal biventricular function without evidence of hemodynamically significant valvular heart disease. No intracardiac source of embolism detected on this  transthoracic study. Consider a transesophageal echocardiogram to exclude cardiac source of embolism if clinically indicated. FINDINGS  Left Ventricle: Left ventricular ejection fraction, by estimation, is 60 to 65%. The left ventricle has normal function. The left ventricle has no regional wall motion abnormalities. The left ventricular internal cavity size was normal in size. There is  no left ventricular hypertrophy. Left ventricular diastolic parameters were normal. Right Ventricle: The right ventricular size is normal. No increase in right ventricular wall thickness. Right ventricular systolic function is normal. Tricuspid regurgitation signal is inadequate for assessing PA pressure. Left Atrium: Left atrial size was normal in size. Right Atrium: Right atrial size was normal in size. Pericardium: Trivial pericardial effusion is present. Mitral Valve: The mitral valve is grossly normal. Trivial mitral valve regurgitation. No evidence of mitral valve  stenosis. Tricuspid Valve: The tricuspid valve is grossly normal. Tricuspid valve regurgitation is trivial. No evidence of tricuspid stenosis. Aortic Valve: The aortic valve is tricuspid. Aortic valve regurgitation is not visualized. No aortic stenosis is present. Aortic valve peak gradient measures 6.1 mmHg. Pulmonic Valve: The pulmonic valve was grossly normal. Pulmonic valve regurgitation is trivial. No evidence of pulmonic stenosis. Aorta: The aortic root and ascending aorta are structurally normal, with no evidence of dilitation. Venous: The right lower pulmonary vein is normal. The inferior vena cava is normal in size with greater than 50% respiratory variability, suggesting right atrial pressure of 3 mmHg. IAS/Shunts: The atrial septum is grossly normal.  LEFT VENTRICLE PLAX 2D LVIDd:         4.00 cm   Diastology LVIDs:         2.30 cm   LV e' medial:    4.58 cm/s LV PW:         1.10 cm   LV E/e' medial:  16.6 LV IVS:        1.10 cm   LV e' lateral:   6.45  cm/s LVOT diam:     2.00 cm   LV E/e' lateral: 11.8 LV SV:         68 LV SV Index:   39 LVOT Area:     3.14 cm  RIGHT VENTRICLE             IVC RV S prime:     13.80 cm/s  IVC diam: 1.80 cm TAPSE (M-mode): 2.5 cm LEFT ATRIUM             Index        RIGHT ATRIUM           Index LA diam:        3.50 cm 2.00 cm/m   RA Area:     10.10 cm LA Vol (A2C):   39.6 ml 22.63 ml/m  RA Volume:   19.70 ml  11.26 ml/m LA Vol (A4C):   46.9 ml 26.80 ml/m LA Biplane Vol: 44.1 ml 25.20 ml/m  AORTIC VALVE                 PULMONIC VALVE AV Area (Vmax): 2.51 cm     PV Vmax:       0.87 m/s AV Vmax:        123.67 cm/s  PV Peak grad:  3.0 mmHg AV Peak Grad:   6.1 mmHg LVOT Vmax:      98.90 cm/s LVOT Vmean:     60.200 cm/s LVOT VTI:       0.217 m  AORTA Ao Root diam: 3.30 cm Ao Asc diam:  3.50 cm MITRAL VALVE MV Area (PHT): 3.45 cm    SHUNTS MV Decel Time: 220 msec    Systemic VTI:  0.22 m MV E velocity: 75.80 cm/s  Systemic Diam: 2.00 cm MV A velocity: 98.10 cm/s MV E/A ratio:  0.77 Eleonore Chiquito MD Electronically signed by Eleonore Chiquito MD Signature Date/Time: 03/03/2021/9:34:03 AM    Final    VAS US CAROTID  Result Date: 03/03/2021 Carotid Arterial Duplex Study Patient Name:  Meagan [name]  Date of Exam:   03/03/2021 Medical Rec #: [id number]         Accession #:    [id number] Date of Birth: Dec 12, 1945         Patient Gender: F Patient Age:   23 years Exam Location:  [facility] Procedure:  VAS US CAROTID Referring Phys: CORTNEY DE LA TORRE --------------------------------------------------------------------------------  Indications:       TIA. Risk Factors:      Hypertension, Diabetes. Comparison Study:  No prior study Performing Technologist: Maudry Mayhew MHA, RDMS, RVT, RDCS  Examination Guidelines: A complete evaluation includes B-mode imaging, spectral Doppler, color Doppler, and power Doppler as needed of all accessible portions of each vessel. Bilateral testing is considered an integral part of a  complete examination. Limited examinations for reoccurring indications may be performed as noted.  Right Carotid Findings: +----------+--------+--------+--------+-----------------------+--------+             PSV cm/s EDV cm/s Stenosis Plaque Description      Comments  +----------+--------+--------+--------+-----------------------+--------+  CCA Prox   101      15                                                  +----------+--------+--------+--------+-----------------------+--------+  CCA Distal 75       16                                                  +----------+--------+--------+--------+-----------------------+--------+  ICA Prox   61       14                smooth and heterogenous           +----------+--------+--------+--------+-----------------------+--------+  ICA Distal 85       21                                                  +----------+--------+--------+--------+-----------------------+--------+  ECA        89       7                                                   +----------+--------+--------+--------+-----------------------+--------+ +----------+--------+-------+----------------+-------------------+             PSV cm/s EDV cms Describe         Arm Pressure (mmHG)  +----------+--------+-------+----------------+-------------------+  Subclavian 139              Multiphasic, WNL                      +----------+--------+-------+----------------+-------------------+ +---------+--------+--+--------+-+---------+  Vertebral PSV cm/s 47 EDV cm/s 7 Antegrade  +---------+--------+--+--------+-+---------+  Left Carotid Findings: +----------+--------+--------+--------+------------------+------------------+             PSV cm/s EDV cm/s Stenosis Plaque Description Comments            +----------+--------+--------+--------+------------------+------------------+  CCA Prox   109      18                                                        +----------+--------+--------+--------+------------------+------------------+  CCA Distal 55       9                                    intimal thickening  +----------+--------+--------+--------+------------------+------------------+  ICA Prox   57       13                                   intimal thickening  +----------+--------+--------+--------+------------------+------------------+  ICA Distal 91       21                                                       +----------+--------+--------+--------+------------------+------------------+  ECA        108      7                                                        +----------+--------+--------+--------+------------------+------------------+ +----------+--------+--------+----------------+-------------------+             PSV cm/s EDV cm/s Describe         Arm Pressure (mmHG)  +----------+--------+--------+----------------+-------------------+  Subclavian 128               Multiphasic, WNL                      +----------+--------+--------+----------------+-------------------+ +---------+--------+--+--------+--+---------+  Vertebral PSV cm/s 59 EDV cm/s 13 Antegrade  +---------+--------+--+--------+--+---------+   Summary: Right Carotid: The extracranial vessels were near-normal with only minimal wall                thickening or plaque. Left Carotid: The extracranial vessels were near-normal with only minimal wall               thickening or plaque. Vertebrals:  Bilateral vertebral arteries demonstrate antegrade flow. Subclavians: Normal flow hemodynamics were seen in bilateral subclavian              arteries. *See table(s) above for measurements and observations.     Preliminary    VAS [place] TRANSCRANIAL DOPPLER  Result Date: 03/03/2021  Transcranial Doppler Patient Name:  Meagan [name]  Date of Exam:   03/03/2021 Medical Rec #: [id number]         Accession #:    [id number] Date of Birth: 08/08/1945         Patient Gender: F Patient Age:   68 years Exam Location:   [facility] Procedure:      VAS [place] TRANSCRANIAL DOPPLER Referring Phys: Elwin Sleight DE LA TORRE --------------------------------------------------------------------------------  Indications: TIA. Limitations for diagnostic windows: Unable to insonate right transtemporal window. Unable to insonate occipital window. Comparison Study: No prior study Performing Technologist: Maudry Mayhew MHA, RDMS, RVT, RDCS  Examination Guidelines: A complete evaluation includes B-mode imaging, spectral Doppler, color Doppler, and power Doppler as needed of all accessible portions of each vessel. Bilateral testing is considered an integral part of a complete examination. Limited examinations for reoccurring indications may be performed as noted.  +----------+-------------+----------+-----------+------------------+  RIGHT TCD  Right VM (  cm) Depth (cm) Pulsatility      Comment        +----------+-------------+----------+-----------+------------------+  MCA                                             Unable to insonate  +----------+-------------+----------+-----------+------------------+  ACA                                             Unable to insonate  +----------+-------------+----------+-----------+------------------+  Term ICA                                        Unable to insonate  +----------+-------------+----------+-----------+------------------+  PCA                                             Unable to insonate  +----------+-------------+----------+-----------+------------------+  Opthalmic      26.00                   1.42                         +----------+-------------+----------+-----------+------------------+  ICA siphon                                      Unable to insonate  +----------+-------------+----------+-----------+------------------+  Vertebral                                       Unable to insonate  +----------+-------------+----------+-----------+------------------+   +----------+------------+----------+-----------+------------------+  LEFT TCD   Left VM (cm) Depth (cm) Pulsatility      Comment        +----------+------------+----------+-----------+------------------+  MCA           63.00                   1.33                         +----------+------------+----------+-----------+------------------+  ACA           -26.00                  1.91                         +----------+------------+----------+-----------+------------------+  Term ICA      39.00                   1.55                         +----------+------------+----------+-----------+------------------+  PCA           24.00                   1.53                         +----------+------------+----------+-----------+------------------+  Opthalmic     16.00                   1.04                         +----------+------------+----------+-----------+------------------+  ICA siphon                                     Unable to insonate  +----------+------------+----------+-----------+------------------+  Vertebral                                      Unable to insonate  +----------+------------+----------+-----------+------------------+  +------------+-----+------------------+               VM cm      Comment        +------------+-----+------------------+  Prox Basilar       Unable to insonate  +------------+-----+------------------+    Preliminary       Subjective: Patient seen and examined the bedside this morning.  Hemodynamically stable for discharge today.  Discharge Exam: Vitals:   03/03/21 1130 03/03/21 1200  BP: (!) 120/49 (!) 118/56  Pulse: 72 81  Resp: 19 (!) 21  Temp:    SpO2: 95% 96%   Vitals:   03/03/21 0800 03/03/21 0925 03/03/21 1130 03/03/21 1200  BP: (!) 111/54 110/72 (!) 120/49 (!) 118/56  Pulse: 84 83 72 81  Resp: '20 15 19 ' (!) 21  Temp:      TempSrc:      SpO2: 97% 98% 95% 96%  Weight: 74 kg     Height: '5\' 2"'  (1.575 m)       General: Pt is alert, awake, not in acute  distress Cardiovascular: RRR, S1/S2 +, no rubs, no gallops Respiratory: CTA bilaterally, no wheezing, no rhonchi Abdominal: Soft, NT, ND, bowel sounds + Extremities: no edema, no cyanosis    The results of significant diagnostics from this hospitalization (including imaging, microbiology, ancillary and laboratory) are listed below for reference.     Microbiology: Recent Results (from the past 240 hour(s))  Resp Panel by RT-PCR (Flu A&B, Covid) Nasopharyngeal Swab     Status: None   Collection Time: 03/02/21  9:28 PM   Specimen: Nasopharyngeal Swab; Nasopharyngeal(NP) swabs in vial transport medium  Result Value Ref Range Status   SARS Coronavirus 2 by RT PCR NEGATIVE NEGATIVE Final    Comment: (NOTE) SARS-CoV-2 target nucleic acids are NOT DETECTED.  The SARS-CoV-2 RNA is generally detectable in upper respiratory specimens during the acute phase of infection. The lowest concentration of SARS-CoV-2 viral copies this assay can detect is 138 copies/mL. A negative result does not preclude SARS-Cov-2 infection and should not be used as the sole basis for treatment or other patient management decisions. A negative result may occur with  improper specimen collection/handling, submission of specimen other than nasopharyngeal swab, presence of viral mutation(s) within the areas targeted by this assay, and inadequate number of viral copies(<138 copies/mL). A negative result must be combined with clinical observations, patient history, and epidemiological information. The expected result is Negative.  Fact Sheet for Patients:  EntrepreneurPulse.com.au  Fact Sheet for Healthcare Providers:  IncredibleEmployment.be  This test is no t yet approved or cleared by the [place] FDA and  has been authorized for detection and/or diagnosis of SARS-CoV-2 by  FDA under an Emergency Use Authorization (EUA). This EUA will remain  in effect (meaning this test  can be used) for the duration of the COVID-19 declaration under Section 564(b)(1) of the Act, 21 U.S.C.section 360bbb-3(b)(1), unless the authorization is terminated  or revoked sooner.       Influenza A by PCR NEGATIVE NEGATIVE Final   Influenza B by PCR NEGATIVE NEGATIVE Final    Comment: (NOTE) The Xpert Xpress SARS-CoV-2/FLU/RSV plus assay is intended as an aid in the diagnosis of influenza from Nasopharyngeal swab specimens and should not be used as a sole basis for treatment. Nasal washings and aspirates are unacceptable for Xpert Xpress SARS-CoV-2/FLU/RSV testing.  Fact Sheet for Patients: EntrepreneurPulse.com.au  Fact Sheet for Healthcare Providers: IncredibleEmployment.be  This test is not yet approved or cleared by the [place] FDA and has been authorized for detection and/or diagnosis of SARS-CoV-2 by FDA under an Emergency Use Authorization (EUA). This EUA will remain in effect (meaning this test can be used) for the duration of the COVID-19 declaration under Section 564(b)(1) of the Act, 21 U.S.C. section 360bbb-3(b)(1), unless the authorization is terminated or revoked.  Performed at [facility], [place] [address], [place], [place] [address]      Labs: BNP (last 3 results) No results for input(s): BNP in the last 8760 hours. Basic Metabolic Panel: Recent Labs  Lab 02/26/21 1016 03/02/21 2003 03/02/21 2037  NA 138 133* 136  K 4.0 4.0 3.7  CL 100 98 99  CO2 29 23  --   GLUCOSE 235* 292* 287*  BUN 36* 22 25*  CREATININE 1.26* 1.22* 1.10*  CALCIUM 9.6 9.2  --    Liver Function Tests: Recent Labs  Lab 02/26/21 1016 03/02/21 2003  AST 14 19  ALT 17 21  ALKPHOS 107 102  BILITOT 0.4 0.3  PROT 8.2 7.9  ALBUMIN 3.6 3.3*   No results for input(s): LIPASE, AMYLASE in the last 168 hours. No results for input(s): AMMONIA in the last 168 hours. CBC: Recent Labs  Lab 02/26/21 1016 03/02/21 2003  03/02/21 2037  WBC 10.3 12.7*  --   NEUTROABS 6.7 9.1*  --   HGB 13.2 13.6 14.6  HCT 40.3 42.1 43.0  MCV 81.2 82.5  --   PLT 292.0 316  --    Cardiac Enzymes: No results for input(s): CKTOTAL, CKMB, CKMBINDEX, TROPONINI in the last 168 hours. BNP: Invalid input(s): POCBNP CBG: Recent Labs  Lab 03/02/21 1958 03/02/21 2151 03/03/21 0805 03/03/21 1258  GLUCAP 298* 284* 241* 197*   D-Dimer No results for input(s): DDIMER in the last 72 hours. Hgb A1c Recent Labs    03/03/21 0209  HGBA1C 8.9*   Lipid Profile Recent Labs    03/03/21 0209  CHOL 151  HDL 44  LDLCALC 79  TRIG 140  CHOLHDL 3.4   Thyroid function studies No results for input(s): TSH, T4TOTAL, T3FREE, THYROIDAB in the last 72 hours.  Invalid input(s): FREET3 Anemia work up No results for input(s): VITAMINB12, FOLATE, FERRITIN, TIBC, IRON, RETICCTPCT in the last 72 hours. Urinalysis    Component Value Date/Time   COLORURINE YELLOW 12/10/2011 0900   APPEARANCEUR CLEAR 12/10/2011 0900   LABSPEC 1.010 08/03/2016 1150   PHURINE 6.5 08/03/2016 1150   PHURINE 6.5 12/10/2011 0900   GLUCOSEU 2,000 08/03/2016 1150   HGBUR Trace 08/03/2016 1150   HGBUR NEGATIVE 12/10/2011 0900   BILIRUBINUR negative 02/26/2021 1156   BILIRUBINUR Negative 08/03/2016 1150   KETONESUR Negative 08/03/2016  [place] 12/10/2011 0900   PROTEINUR Negative 02/26/2021 1156   PROTEINUR Negative 08/03/2016 1150   PROTEINUR NEGATIVE 12/10/2011 0900   UROBILINOGEN 0.2 02/26/2021 1156   UROBILINOGEN 0.2 08/03/2016 1150   NITRITE negative 02/26/2021 1156   NITRITE Negative 08/03/2016 1150   NITRITE NEGATIVE 12/10/2011 0900   LEUKOCYTESUR Large (3+) (A) 02/26/2021 1156   LEUKOCYTESUR Moderate 08/03/2016 1150   Sepsis Labs Invalid input(s): PROCALCITONIN,  WBC,  LACTICIDVEN Microbiology Recent Results (from the past 240 hour(s))  Resp Panel by RT-PCR (Flu A&B, Covid) Nasopharyngeal Swab     Status: None    Collection Time: 03/02/21  9:28 PM   Specimen: Nasopharyngeal Swab; Nasopharyngeal(NP) swabs in vial transport medium  Result Value Ref Range Status   SARS Coronavirus 2 by RT PCR NEGATIVE NEGATIVE Final    Comment: (NOTE) SARS-CoV-2 target nucleic acids are NOT DETECTED.  The SARS-CoV-2 RNA is generally detectable in upper respiratory specimens during the acute phase of infection. The lowest concentration of SARS-CoV-2 viral copies this assay can detect is 138 copies/mL. A negative result does not preclude SARS-Cov-2 infection and should not be used as the sole basis for treatment or other patient management decisions. A negative result may occur with  improper specimen collection/handling, submission of specimen other than nasopharyngeal swab, presence of viral mutation(s) within the areas targeted by this assay, and inadequate number of viral copies(<138 copies/mL). A negative result must be combined with clinical observations, patient history, and epidemiological information. The expected result is Negative.  Fact Sheet for Patients:  EntrepreneurPulse.com.au  Fact Sheet for Healthcare Providers:  IncredibleEmployment.be  This test is no t yet approved or cleared by the [place] FDA and  has been authorized for detection and/or diagnosis of SARS-CoV-2 by FDA under an Emergency Use Authorization (EUA). This EUA will remain  in effect (meaning this test can be used) for the duration of the COVID-19 declaration under Section 564(b)(1) of the Act, 21 U.S.C.section 360bbb-3(b)(1), unless the authorization is terminated  or revoked sooner.       Influenza A by PCR NEGATIVE NEGATIVE Final   Influenza B by PCR NEGATIVE NEGATIVE Final    Comment: (NOTE) The Xpert Xpress SARS-CoV-2/FLU/RSV plus assay is intended as an aid in the diagnosis of influenza from Nasopharyngeal swab specimens and should not be used as a sole basis for treatment.  Nasal washings and aspirates are unacceptable for Xpert Xpress SARS-CoV-2/FLU/RSV testing.  Fact Sheet for Patients: EntrepreneurPulse.com.au  Fact Sheet for Healthcare Providers: IncredibleEmployment.be  This test is not yet approved or cleared by the [place] FDA and has been authorized for detection and/or diagnosis of SARS-CoV-2 by FDA under an Emergency Use Authorization (EUA). This EUA will remain in effect (meaning this test can be used) for the duration of the COVID-19 declaration under Section 564(b)(1) of the Act, 21 U.S.C. section 360bbb-3(b)(1), unless the authorization is terminated or revoked.  Performed at [facility], [place] [address] [place], [place] [address]     Please note: You were cared for by a hospitalist during your hospital stay. Once you are discharged, your primary care physician will handle any further medical issues. Please note that NO REFILLS for any discharge medications will be authorized once you are discharged, as it is imperative that you return to your primary care physician (or establish a relationship with a primary care physician if you do not have one) for your post hospital discharge needs so that they can reassess your  need for medications and monitor your lab values.    Time coordinating discharge: 40 minutes  SIGNED:   Shelly Coss, MD  [facility] 03/03/2021, 1:16 PM Pager [phone]  If 7PM-7AM, please contact night-coverage [email or web address] Password [email or web address]

## 2021-03-03 NOTE — Progress Notes (Signed)
Inpatient Diabetes Program Recommendations  AACE/ADA: New Consensus Statement on Inpatient Glycemic Control (2015)  Target Ranges:  Prepandial:   less than 140 mg/dL      Peak postprandial:   less than 180 mg/dL (1-2 hours)      Critically ill patients:  140 - 180 mg/dL   Lab Results  Component Value Date   GLUCAP 241 (H) 03/03/2021   HGBA1C 8.9 (H) 03/03/2021    Review of Glycemic Control  Latest Reference Range & Units 03/02/21 19:58 03/02/21 21:51 03/03/21 08:05  Glucose-Capillary 70 - 99 mg/dL 298 (H) 284 (H) 241 (H)   Diabetes history: DM 2 Outpatient Diabetes medications: Amaryl 4 mg bid Current orders for Inpatient glycemic control:  Novolog sensitive tid with meals  Inpatient Diabetes Program Recommendations:    May consider adding Semglee 14 units daily.   Thanks,  Adah Perl, RN, BC-ADM Inpatient Diabetes Coordinator Pager [phone]  (8a-5p)

## 2021-03-03 NOTE — Progress Notes (Signed)
OT Cancellation Note  Patient Details Name: Meagan [name] MRN: [id number] DOB: April 05, 1945   Cancelled Treatment:    Reason Eval/Treat Not Completed: OT screened, no needs identified, will sign off- per PT, patient back to baseline.  Independent with Adls.  OT will sign off.  Jolaine Artist, OT Acute Rehabilitation Services Pager [phone] Office [phone]   [name] 03/03/2021, 10:15 AM

## 2021-03-03 NOTE — ED Notes (Signed)
Checked patient blood at was 241 notified RN of blood sugar

## 2021-03-04 ENCOUNTER — Encounter: Payer: Self-pay | Admitting: Family Medicine

## 2021-03-05 ENCOUNTER — Telehealth: Payer: Self-pay | Admitting: Family Medicine

## 2021-03-05 ENCOUNTER — Other Ambulatory Visit: Payer: Self-pay

## 2021-03-05 LAB — URINE CULTURE
MICRO NUMBER:: 13000599
SPECIMEN QUALITY:: ADEQUATE

## 2021-03-05 MED ORDER — CEPHALEXIN 500 MG PO CAPS: 500.0000 mg | ORAL_CAPSULE | Freq: Three times a day (TID) | ORAL | 0 refills | Status: AC

## 2021-03-05 NOTE — Telephone Encounter (Signed)
Called and spoke with pt and made aware the antibiotic has already been sent and I notified her via responding to her question on mychart this morning. Pt aware.

## 2021-03-05 NOTE — Telephone Encounter (Signed)
Pt called very worried- husband stated hospital dropped the bomb on her UTI. Patient is worried her UTI will get worse. Patient aware Dr Yong Channel is out of the office. Patient has been triaged. Awaiting triage notes from [facility] pt coordinator.

## 2021-04-03 ENCOUNTER — Telehealth: Payer: Self-pay | Admitting: Family Medicine

## 2021-04-03 NOTE — Telephone Encounter (Signed)
Spoke with patient she stated her husband was having surgery and to call back middle of April 2023 ?

## 2021-04-15 ENCOUNTER — Emergency Department (HOSPITAL_COMMUNITY): Payer: Medicare HMO

## 2021-04-15 ENCOUNTER — Observation Stay (HOSPITAL_COMMUNITY): Payer: Medicare HMO

## 2021-04-15 ENCOUNTER — Inpatient Hospital Stay (HOSPITAL_COMMUNITY)
Admission: EM | Admit: 2021-04-15 | Discharge: 2021-04-17 | DRG: 872 | Disposition: A | Discharge status: Home / Self Care | Payer: Medicare HMO | Attending: Student | Admitting: Student

## 2021-04-15 ENCOUNTER — Other Ambulatory Visit: Payer: Self-pay

## 2021-04-15 ENCOUNTER — Encounter: Payer: Self-pay | Admitting: Family Medicine

## 2021-04-15 ENCOUNTER — Telehealth: Payer: Self-pay | Admitting: Family Medicine

## 2021-04-15 ENCOUNTER — Ambulatory Visit (INDEPENDENT_AMBULATORY_CARE_PROVIDER_SITE_OTHER): Payer: Medicare HMO | Admitting: Family Medicine

## 2021-04-15 ENCOUNTER — Encounter (HOSPITAL_COMMUNITY): Payer: Self-pay | Admitting: Emergency Medicine

## 2021-04-15 VITALS — BP 108/67 | HR 120 | Temp 98.1°F | Ht 62.0 in | Wt 163.2 lb

## 2021-04-15 DIAGNOSIS — A4151 Sepsis due to Escherichia coli [E. coli]: Secondary | ICD-10-CM | POA: Diagnosis not present

## 2021-04-15 DIAGNOSIS — N200 Calculus of kidney: Secondary | ICD-10-CM | POA: Diagnosis not present

## 2021-04-15 DIAGNOSIS — K760 Fatty (change of) liver, not elsewhere classified: Secondary | ICD-10-CM | POA: Diagnosis not present

## 2021-04-15 DIAGNOSIS — R509 Fever, unspecified: Secondary | ICD-10-CM | POA: Diagnosis not present

## 2021-04-15 DIAGNOSIS — D72825 Bandemia: Secondary | ICD-10-CM | POA: Diagnosis not present

## 2021-04-15 DIAGNOSIS — A419 Sepsis, unspecified organism: Secondary | ICD-10-CM | POA: Diagnosis not present

## 2021-04-15 DIAGNOSIS — Z880 Allergy status to penicillin: Secondary | ICD-10-CM

## 2021-04-15 DIAGNOSIS — D649 Anemia, unspecified: Secondary | ICD-10-CM | POA: Diagnosis not present

## 2021-04-15 DIAGNOSIS — N179 Acute kidney failure, unspecified: Secondary | ICD-10-CM | POA: Diagnosis not present

## 2021-04-15 DIAGNOSIS — N1831 Chronic kidney disease, stage 3a: Secondary | ICD-10-CM | POA: Diagnosis present

## 2021-04-15 DIAGNOSIS — R6520 Severe sepsis without septic shock: Secondary | ICD-10-CM | POA: Diagnosis present

## 2021-04-15 DIAGNOSIS — Z881 Allergy status to other antibiotic agents status: Secondary | ICD-10-CM

## 2021-04-15 DIAGNOSIS — E1159 Type 2 diabetes mellitus with other circulatory complications: Secondary | ICD-10-CM | POA: Diagnosis not present

## 2021-04-15 DIAGNOSIS — I129 Hypertensive chronic kidney disease with stage 1 through stage 4 chronic kidney disease, or unspecified chronic kidney disease: Secondary | ICD-10-CM | POA: Diagnosis present

## 2021-04-15 DIAGNOSIS — E1122 Type 2 diabetes mellitus with diabetic chronic kidney disease: Secondary | ICD-10-CM | POA: Diagnosis not present

## 2021-04-15 DIAGNOSIS — Z7984 Long term (current) use of oral hypoglycemic drugs: Secondary | ICD-10-CM | POA: Diagnosis not present

## 2021-04-15 DIAGNOSIS — N390 Urinary tract infection, site not specified: Secondary | ICD-10-CM | POA: Diagnosis not present

## 2021-04-15 DIAGNOSIS — E872 Acidosis, unspecified: Secondary | ICD-10-CM

## 2021-04-15 DIAGNOSIS — R7881 Bacteremia: Secondary | ICD-10-CM | POA: Diagnosis not present

## 2021-04-15 DIAGNOSIS — I152 Hypertension secondary to endocrine disorders: Secondary | ICD-10-CM | POA: Diagnosis present

## 2021-04-15 DIAGNOSIS — R7989 Other specified abnormal findings of blood chemistry: Secondary | ICD-10-CM

## 2021-04-15 DIAGNOSIS — Z79899 Other long term (current) drug therapy: Secondary | ICD-10-CM

## 2021-04-15 DIAGNOSIS — Z8673 Personal history of transient ischemic attack (TIA), and cerebral infarction without residual deficits: Secondary | ICD-10-CM | POA: Diagnosis not present

## 2021-04-15 DIAGNOSIS — Z91013 Allergy to seafood: Secondary | ICD-10-CM

## 2021-04-15 DIAGNOSIS — E1129 Type 2 diabetes mellitus with other diabetic kidney complication: Secondary | ICD-10-CM | POA: Diagnosis not present

## 2021-04-15 DIAGNOSIS — I10 Essential (primary) hypertension: Secondary | ICD-10-CM | POA: Diagnosis present

## 2021-04-15 DIAGNOSIS — E871 Hypo-osmolality and hyponatremia: Secondary | ICD-10-CM | POA: Diagnosis not present

## 2021-04-15 DIAGNOSIS — E1169 Type 2 diabetes mellitus with other specified complication: Secondary | ICD-10-CM | POA: Diagnosis present

## 2021-04-15 DIAGNOSIS — R000 Tachycardia, unspecified: Secondary | ICD-10-CM | POA: Diagnosis not present

## 2021-04-15 DIAGNOSIS — D72829 Elevated white blood cell count, unspecified: Secondary | ICD-10-CM

## 2021-04-15 DIAGNOSIS — E1165 Type 2 diabetes mellitus with hyperglycemia: Secondary | ICD-10-CM

## 2021-04-15 DIAGNOSIS — E785 Hyperlipidemia, unspecified: Secondary | ICD-10-CM | POA: Diagnosis not present

## 2021-04-15 DIAGNOSIS — E119 Type 2 diabetes mellitus without complications: Secondary | ICD-10-CM

## 2021-04-15 DIAGNOSIS — Z888 Allergy status to other drugs, medicaments and biological substances status: Secondary | ICD-10-CM

## 2021-04-15 DIAGNOSIS — K219 Gastro-esophageal reflux disease without esophagitis: Secondary | ICD-10-CM | POA: Diagnosis present

## 2021-04-15 DIAGNOSIS — E782 Mixed hyperlipidemia: Secondary | ICD-10-CM | POA: Diagnosis present

## 2021-04-15 DIAGNOSIS — N3001 Acute cystitis with hematuria: Secondary | ICD-10-CM | POA: Diagnosis not present

## 2021-04-15 LAB — COMPREHENSIVE METABOLIC PANEL
ALT: 24 U/L (ref 0–35)
ALT: 27 U/L (ref 0–44)
AST: 20 U/L (ref 0–37)
AST: 24 U/L (ref 15–41)
Albumin: 3.7 g/dL (ref 3.5–5.2)
Albumin: 3.2 g/dL — ABNORMAL LOW (ref 3.5–5.0)
Alkaline Phosphatase: 100 U/L (ref 39–117)
Alkaline Phosphatase: 83 U/L (ref 38–126)
Anion gap: 11 (ref 5–15)
BUN: 29 mg/dL — ABNORMAL HIGH (ref 6–23)
BUN: 34 mg/dL — ABNORMAL HIGH (ref 8–23)
CO2: 23 mEq/L (ref 19–32)
CO2: 23 mmol/L (ref 22–32)
Calcium: 9.5 mg/dL (ref 8.4–10.5)
Calcium: 8.7 mg/dL — ABNORMAL LOW (ref 8.9–10.3)
Chloride: 98 mEq/L (ref 96–112)
Chloride: 100 mmol/L (ref 98–111)
Creatinine, Ser: 1.33 mg/dL — ABNORMAL HIGH (ref 0.40–1.20)
Creatinine, Ser: 1.41 mg/dL — ABNORMAL HIGH (ref 0.44–1.00)
GFR, Estimated: 39 mL/min — ABNORMAL LOW (ref >60)
GFR: 39.42 mL/min — ABNORMAL LOW (ref >60.00)
Glucose, Bld: 225 mg/dL — ABNORMAL HIGH (ref 70–99)
Glucose, Bld: 203 mg/dL — ABNORMAL HIGH (ref 70–99)
Potassium: 3.6 mEq/L (ref 3.5–5.1)
Potassium: 3.8 mmol/L (ref 3.5–5.1)
Sodium: 134 mEq/L — ABNORMAL LOW (ref 135–145)
Sodium: 134 mmol/L — ABNORMAL LOW (ref 135–145)
Total Bilirubin: 0.9 mg/dL (ref 0.2–1.2)
Total Bilirubin: 0.9 mg/dL (ref 0.3–1.2)
Total Protein: 8 g/dL (ref 6.0–8.3)
Total Protein: 7.9 g/dL (ref 6.5–8.1)

## 2021-04-15 LAB — URINALYSIS, ROUTINE W REFLEX MICROSCOPIC
Bilirubin Urine: NEGATIVE
Ketones, ur: NEGATIVE
Nitrite: NEGATIVE
Specific Gravity, Urine: 1.02 (ref 1.000–1.030)
Urine Glucose: NEGATIVE
Urobilinogen, UA: 0.2 (ref 0.0–1.0)
pH: 5.5 (ref 5.0–8.0)

## 2021-04-15 LAB — CBC WITH DIFFERENTIAL/PLATELET
Abs Immature Granulocytes: 0.26 10*3/uL — ABNORMAL HIGH (ref 0.00–0.07)
Basophils Absolute: 0.1 10*3/uL (ref 0.0–0.1)
Basophils Relative: 0 %
Eosinophils Absolute: 0.1 10*3/uL (ref 0.0–0.5)
Eosinophils Relative: 0 %
HCT: 38.2 % (ref 36.0–46.0)
Hemoglobin: 12.5 g/dL (ref 12.0–15.0)
Immature Granulocytes: 1 %
Lymphocytes Relative: 4 %
Lymphs Abs: 1 10*3/uL (ref 0.7–4.0)
MCH: 26.7 pg (ref 26.0–34.0)
MCHC: 32.7 g/dL (ref 30.0–36.0)
MCV: 81.4 fL (ref 80.0–100.0)
Monocytes Absolute: 0.6 10*3/uL (ref 0.1–1.0)
Monocytes Relative: 3 %
Neutro Abs: 23.1 10*3/uL — ABNORMAL HIGH (ref 1.7–7.7)
Neutrophils Relative %: 92 %
Platelets: 292 10*3/uL (ref 150–400)
RBC: 4.69 MIL/uL (ref 3.87–5.11)
RDW: 13.8 % (ref 11.5–15.5)
WBC: 25.2 10*3/uL — ABNORMAL HIGH (ref 4.0–10.5)
nRBC: 0 % (ref 0.0–0.2)

## 2021-04-15 LAB — LACTIC ACID, PLASMA
Lactic Acid, Venous: 2.4 mmol/L (ref 0.5–1.9)
Lactic Acid, Venous: 1.7 mmol/L (ref 0.5–1.9)

## 2021-04-15 LAB — CBC
HCT: 40 % (ref 36.0–46.0)
Hemoglobin: 13.1 g/dL (ref 12.0–15.0)
MCHC: 32.8 g/dL (ref 30.0–36.0)
MCV: 80.5 fl (ref 78.0–100.0)
Platelets: 299 10*3/uL (ref 150.0–400.0)
RBC: 4.97 Mil/uL (ref 3.87–5.11)
RDW: 14.5 % (ref 11.5–15.5)
WBC: 30.9 10*3/uL (ref 4.0–10.5)

## 2021-04-15 IMAGING — CT CT ABD-PELV W/O CM
2 of 4 series · 16 of 46 positions shown, 18 images · non-contrast
Comparison: None.

CLINICAL DATA: Sepsis, rule out complicated UTI, abscess.



[Series 2: axial st · axial · 0.83mm/px · z∈[-464,-9]mm · 13 of 105 slices shown, 15 images]
[im 7/105  soft-tissue]
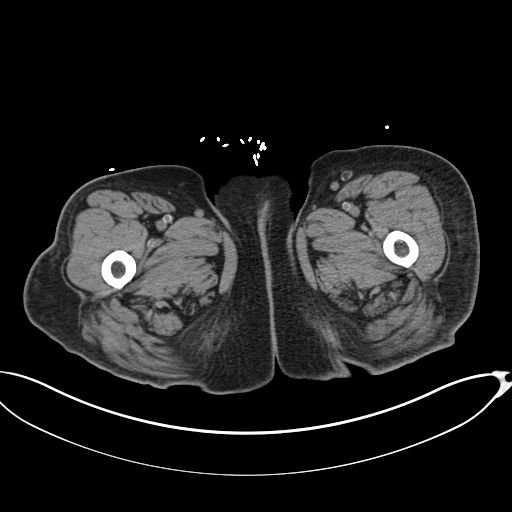
[im 7/105  bone]
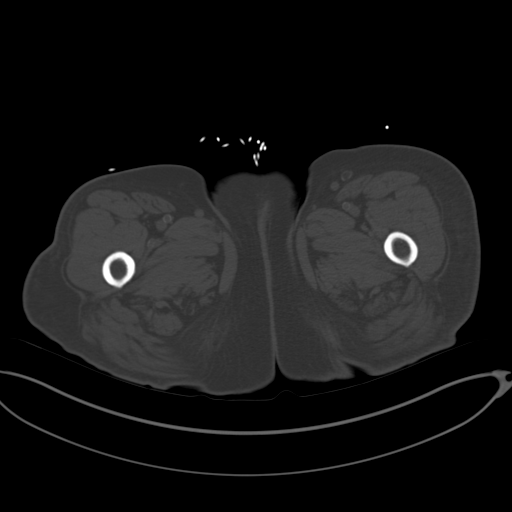
[im 14/105  soft-tissue]
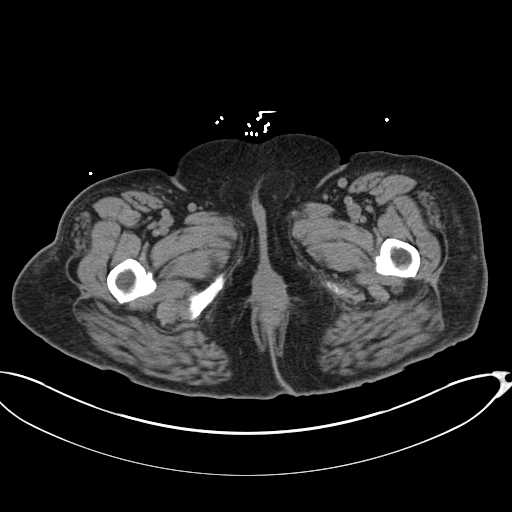
[im 20/105  soft-tissue]
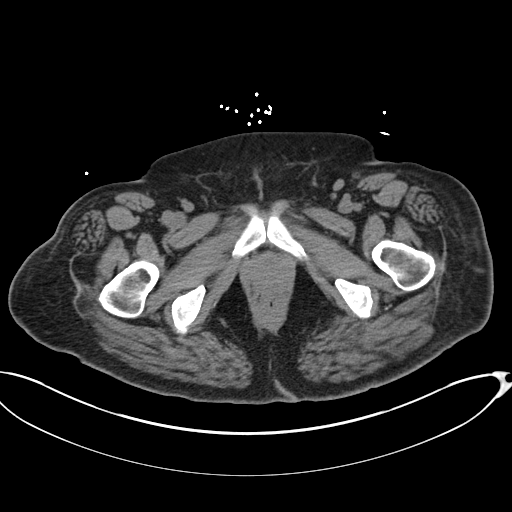
[im 33/105  soft-tissue]
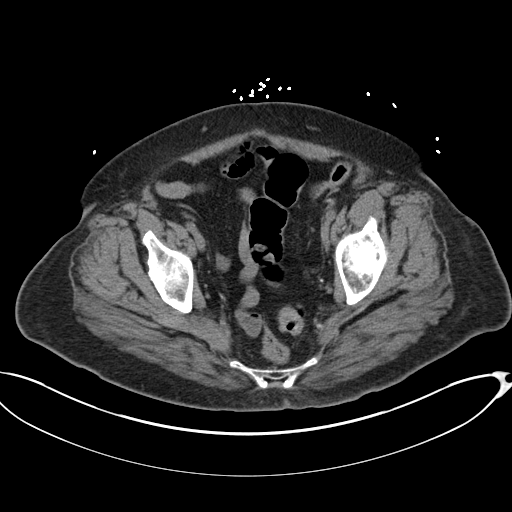
[im 40/105  soft-tissue]
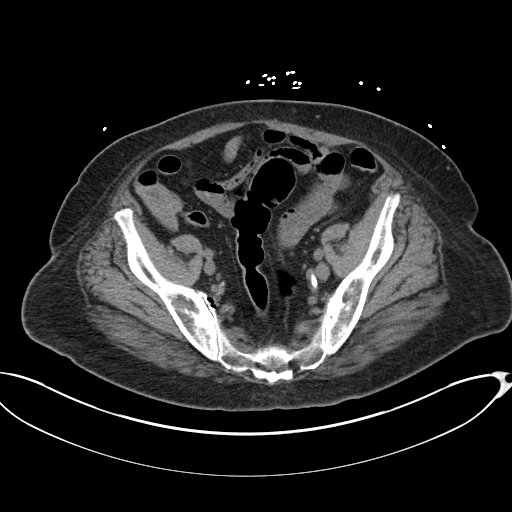
[im 46/105  soft-tissue]
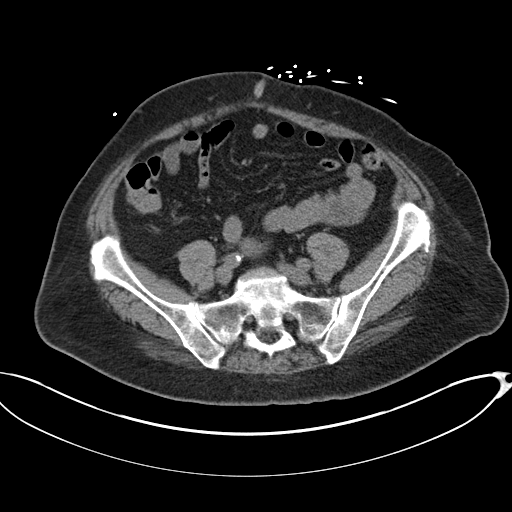
[im 53/105  soft-tissue]
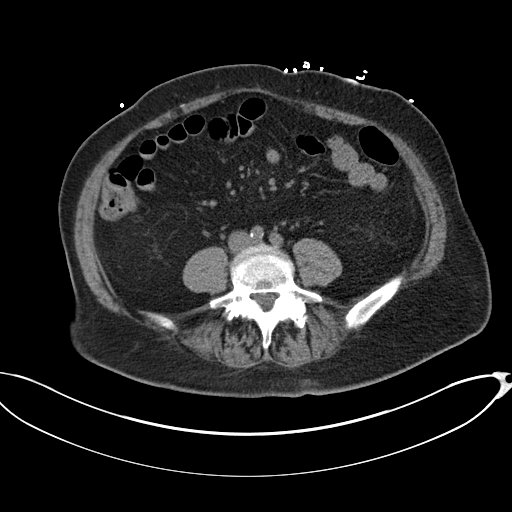
[im 59/105  soft-tissue]
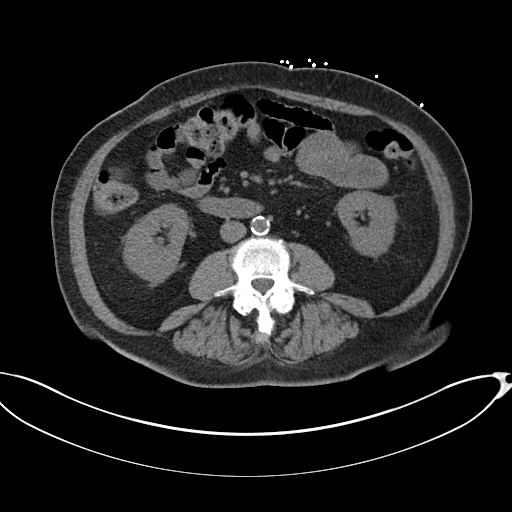
[im 66/105  soft-tissue]
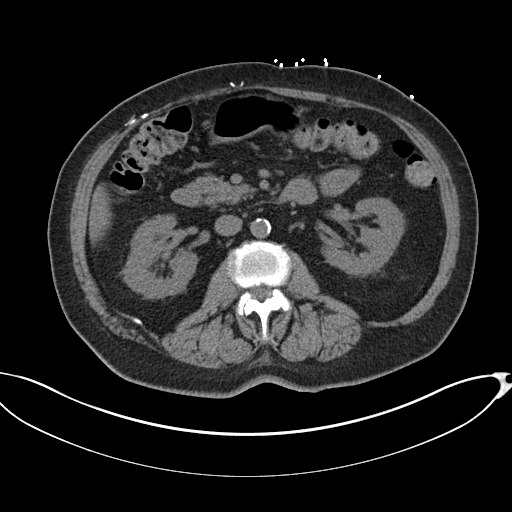
[im 66/105  bone]
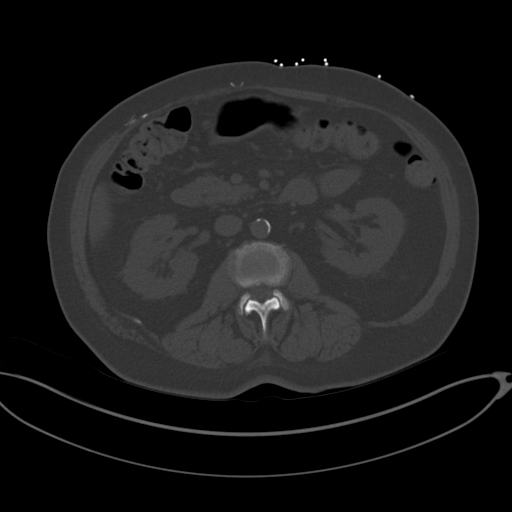
[im 72/105  soft-tissue]
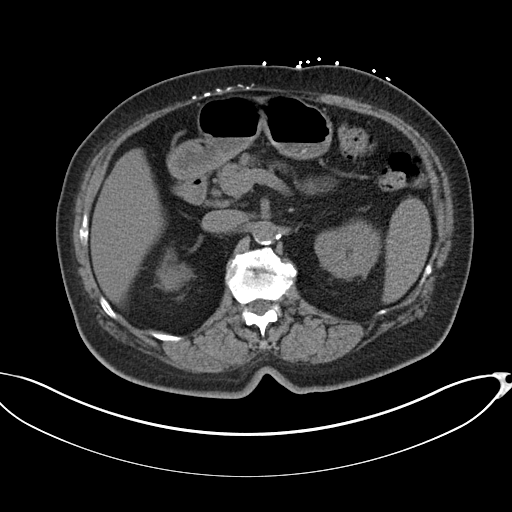
[im 85/105  soft-tissue]
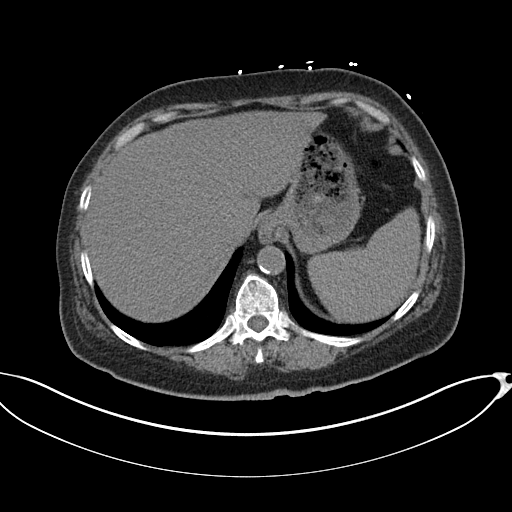
[im 92/105  soft-tissue]
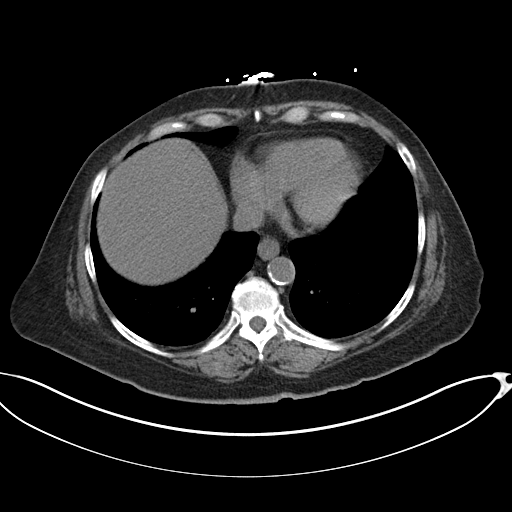
[im 98/105  soft-tissue]
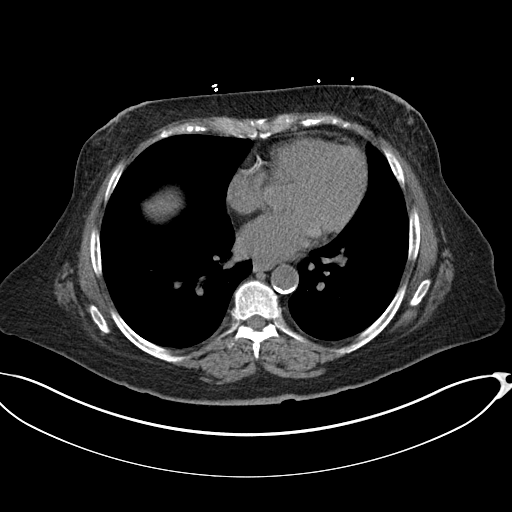

[Series 4: coronal st · coronal · 0.89mm/px · 3 of 139 slices shown]
[im 47/139  soft-tissue]
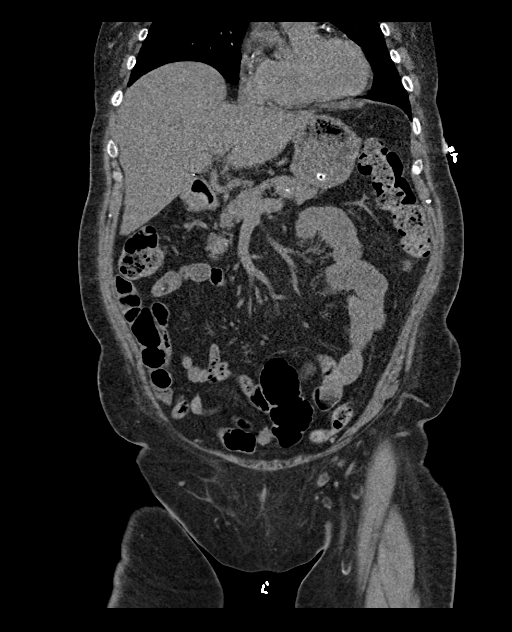
[im 62/139  soft-tissue]
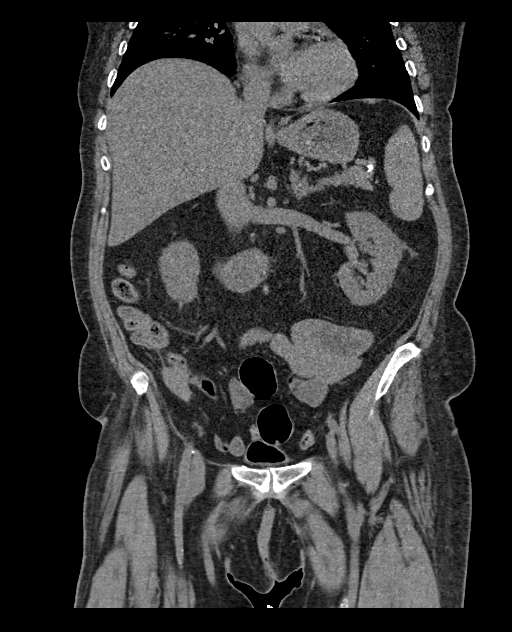
[im 77/139  soft-tissue]
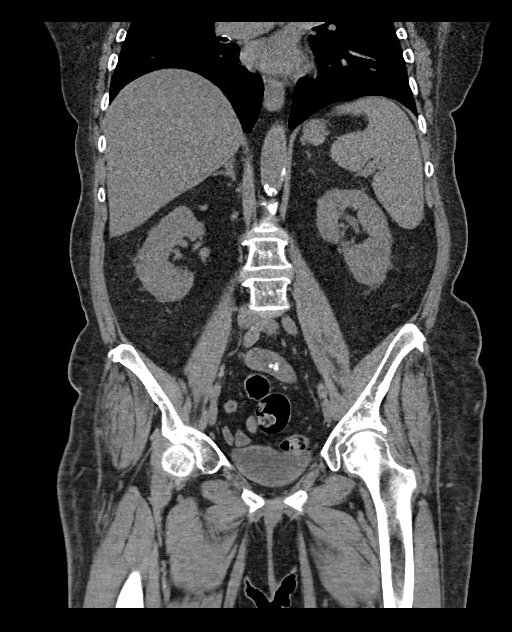

[16 of 46 positions shown; findings below may reference images not displayed]

FINDINGS: Lower chest: The heart is normal in size and there is no pericardial
effusion. Multi-vessel coronary artery calcifications are noted.
Mild atelectasis is noted at the lung bases.

Hepatobiliary: Fatty infiltration of the liver is noted. The
gallbladder is surgically absent. No biliary ductal dilatation.

Pancreas: Unremarkable. No pancreatic ductal dilatation or
surrounding inflammatory changes.

Spleen: Normal in size without focal abnormality.

Adrenals/Urinary Tract: The adrenal glands are within normal limits.
There is a punctate calcification in the mid left kidney. No
hydronephrosis bilaterally. Air is present in the anterior aspect of
the urinary bladder which may be iatrogenic.

Stomach/Bowel: There is a small hiatal hernia. No bowel obstruction,
free air, or pneumatosis. No focal bowel wall thickening. A normal
appendix is present in the right lower quadrant.

Vascular/Lymphatic: Aortic atherosclerosis. No enlarged abdominal or
pelvic lymph nodes.

Reproductive: Status post hysterectomy. No adnexal masses.

Other: No abdominal wall hernia or abnormality. No abdominopelvic
ascites.

Musculoskeletal: Degenerative changes are present in the
thoracolumbar spine. No acute osseous abnormality is seen.
IMPRESSION: 1. Nonobstructive left renal calculus. No hydronephrosis
bilaterally.
2. Hepatic steatosis.
3. Aortic atherosclerosis and coronary artery calcifications.
4. Small hiatal hernia.

## 2021-04-15 IMAGING — DX DG CHEST 1V PORT
1 series · 1 of 1 positions shown · non-contrast
Comparison: AP chest [DATE]

CLINICAL DATA: Fever.  Elevated white blood cell count.

EXAM:
PORTABLE CHEST 1 VIEW

[chest ap]
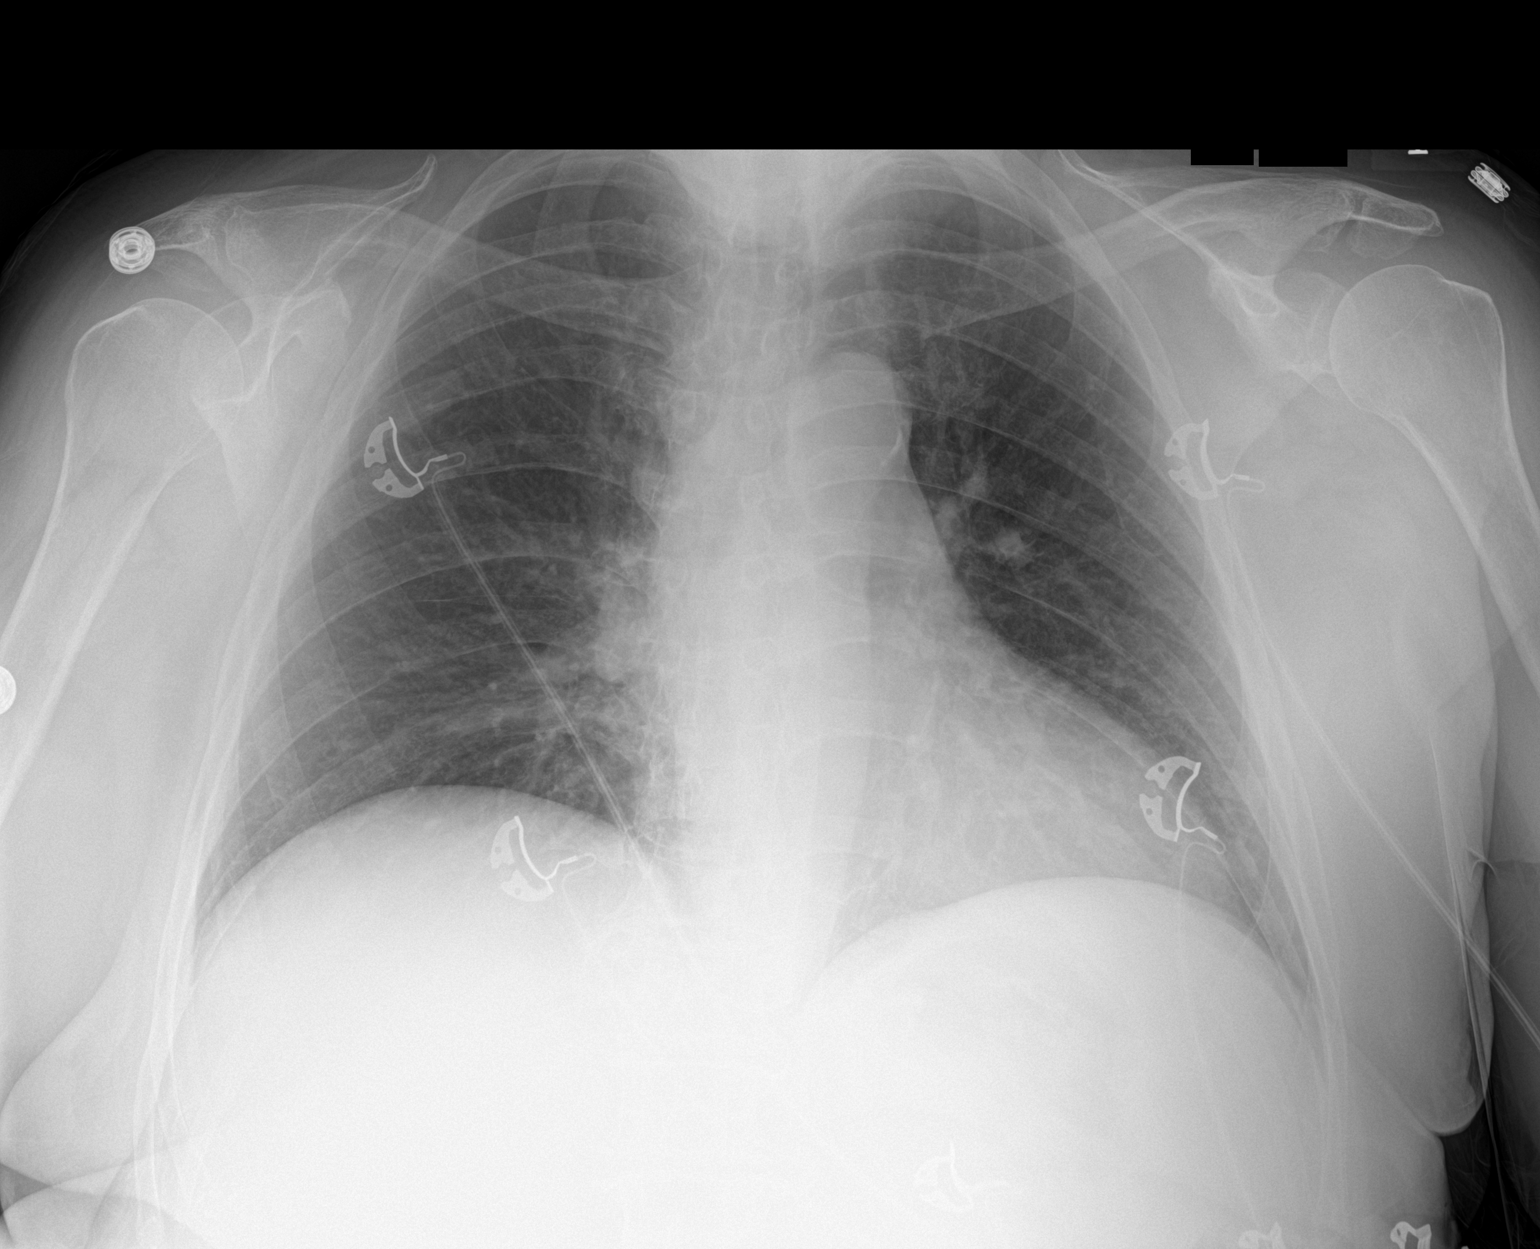

[1 of 1 positions shown; findings below may reference images not displayed]

FINDINGS: Cardiac silhouette and mediastinal contours are within normal
limits. Mild calcification within aortic arch. Mildly decreased lung
volumes. Mild chronic interstitial thickening is unchanged. No
pleural effusion or pneumothorax. No acute skeletal abnormality.
IMPRESSION: No active disease.

## 2021-04-15 MED ORDER — SODIUM CHLORIDE 0.9 % IV SOLN
1.0000 g | INTRAVENOUS | Status: DC
  Administered 2021-04-16: 1 g via INTRAVENOUS
  Filled 2021-04-15: qty 10

## 2021-04-15 MED ORDER — SENNOSIDES-DOCUSATE SODIUM 8.6-50 MG PO TABS: 1.0000 | ORAL_TABLET | Freq: Every evening | ORAL | Status: DC | PRN

## 2021-04-15 MED ORDER — ASPIRIN EC 81 MG PO TBEC
81.0000 mg | DELAYED_RELEASE_TABLET | Freq: Every day | ORAL | Status: DC
Start: 2021-04-16 — End: 2021-04-17
  Administered 2021-04-16 – 2021-04-17 (×2): 81 mg via ORAL
  Filled 2021-04-15 (×2): qty 1

## 2021-04-15 MED ORDER — ENOXAPARIN SODIUM 40 MG/0.4ML IJ SOSY
40.0000 mg | PREFILLED_SYRINGE | INTRAMUSCULAR | Status: DC
  Administered 2021-04-16: 40 mg via SUBCUTANEOUS
  Filled 2021-04-15 (×2): qty 0.4

## 2021-04-15 MED ORDER — INSULIN ASPART 100 UNIT/ML IJ SOLN
0.0000 [IU] | Freq: Three times a day (TID) | INTRAMUSCULAR | Status: DC
  Administered 2021-04-16: 2 [IU] via SUBCUTANEOUS
  Administered 2021-04-17: 1 [IU] via SUBCUTANEOUS

## 2021-04-15 MED ORDER — LACTATED RINGERS IV BOLUS
1000.0000 mL | Freq: Once | INTRAVENOUS | Status: AC
  Administered 2021-04-15: 1000 mL via INTRAVENOUS

## 2021-04-15 MED ORDER — ACETAMINOPHEN 325 MG PO TABS: 650.0000 mg | ORAL_TABLET | Freq: Four times a day (QID) | ORAL | Status: DC | PRN

## 2021-04-15 MED ORDER — INSULIN ASPART 100 UNIT/ML IJ SOLN: 0.0000 [IU] | Freq: Every day | INTRAMUSCULAR | Status: DC

## 2021-04-15 MED ORDER — FAMOTIDINE 20 MG PO TABS
20.0000 mg | ORAL_TABLET | Freq: Every day | ORAL | Status: DC
  Administered 2021-04-16 – 2021-04-17 (×2): 20 mg via ORAL
  Filled 2021-04-15 (×3): qty 1

## 2021-04-15 MED ORDER — ONDANSETRON HCL 4 MG/2ML IJ SOLN: 4.0000 mg | Freq: Four times a day (QID) | INTRAMUSCULAR | Status: DC | PRN

## 2021-04-15 MED ORDER — CEFDINIR 300 MG PO CAPS: 300.0000 mg | ORAL_CAPSULE | Freq: Two times a day (BID) | ORAL | 0 refills | Status: DC

## 2021-04-15 MED ORDER — SODIUM CHLORIDE 0.9 % IV SOLN
1.0000 g | Freq: Once | INTRAVENOUS | Status: AC
  Administered 2021-04-15: 1 g via INTRAVENOUS
  Filled 2021-04-15: qty 10

## 2021-04-15 MED ORDER — LACTATED RINGERS IV SOLN: INTRAVENOUS | Status: DC

## 2021-04-15 MED ORDER — ONDANSETRON HCL 4 MG PO TABS: 4.0000 mg | ORAL_TABLET | Freq: Four times a day (QID) | ORAL | Status: DC | PRN

## 2021-04-15 MED ORDER — LACTATED RINGERS IV BOLUS
1000.0000 mL | Freq: Once | INTRAVENOUS | Status: AC
Start: 2021-04-15 — End: 2021-04-15
  Administered 2021-04-15: 1000 mL via INTRAVENOUS

## 2021-04-15 MED ORDER — IOHEXOL 300 MG/ML  SOLN: 100.0000 mL | Freq: Once | INTRAMUSCULAR | Status: DC | PRN

## 2021-04-15 MED ORDER — ACETAMINOPHEN 650 MG RE SUPP: 650.0000 mg | Freq: Four times a day (QID) | RECTAL | Status: DC | PRN

## 2021-04-15 NOTE — Telephone Encounter (Signed)
Critical Lab: ?White blood count 30.9 ?Time: 2:45 results received from Santiago Glad from [facility] ?

## 2021-04-15 NOTE — H&P (Signed)
?History and Physical  ? ? ?Meagan [name]:[id number] DOB: [birth year] DOA: 04/15/2021 ? ?PCP: Marin Olp, MD  ?Patient coming from: Home ? ?I have personally briefly reviewed patient's old medical records in [place] ? ?Chief Complaint: Nausea, vomiting, leukocytosis ? ?HPI: ?Meagan [name] Meagan [name] is a 76 y.o. female with medical history significant for T2DM, HTN, CKD stage IIIa, HLD, GERD who presented to the ED for evaluation of nausea, vomiting, leukocytosis. ? ?Patient states that he has been having chills, subjective fevers, diaphoresis, nausea with vomiting beginning this morning.  She states that her urination is abnormal but not really painful.  She has not had any abdominal pain or diarrhea.  She says she did have similar symptoms about 5 days ago after she ate out at [employer].  She thought she had food poisoning at that time but her symptoms resolved after a day. ? ?She went to her PCP this morning (3/29). Urinalysis showed small leukocytes, negative nitrites, 11-20 WBC/hpf, 3-6 RBC/hpf, many bacteria on microscopy.  Urine culture was collected and pending.  Patient was started on oral antibiotic course with Omnicef.  She had taken 1 dose so far.  CBC was also obtained and showed significant leukocytosis with WBC 30.9.  Patient was called with results and advised to present to the ED for further evaluation. ? ?ED Course  Labs/Imaging on admission: I have personally reviewed following labs and imaging studies. ? ?Initial vitals showed BP 111/49, pulse 119, RR 18-31, temp 98.4 ?F, SPO2 94% on room air. ? ?Labs show WBC 25.2 with neutrophil predominance, hemoglobin 12.5, platelets 292,000, sodium 134, potassium 3.8, bicarb 23, BUN 34, creatinine 1.41 (baseline around 1.2), serum glucose 203.  Blood and urine cultures ordered and pending. ? ?Portable chest x-ray negative for focal consolidation, edema, effusion. ? ?CT abdomen/pelvis without contrast shows nonobstructive left renal calculus  without hydronephrosis.  Hepatic steatosis, aortic atherosclerosis and coronary artery calcifications, small hiatal hernia also noted. ? ?Patient was given 2 L LR and IV ceftriaxone.  The hospitalist service was consulted to admit for further evaluation and management. ? ?Review of Systems: All systems reviewed and are negative except as documented in history of present illness above. ? ? ?Past Medical History:  ?Diagnosis Date  ? Anemia   ? hx   ? Anxiety   ? no meds  ? Arthritis   ? knees  ? Cancer [facility])   ? endometrial cancer  ? Diabetes mellitus without complication ([name])   ? on oral medication  ? GERD (gastroesophageal reflux disease)   ? pepcid now/famotidine  ? Hypertension   ? SVD (spontaneous vaginal delivery)   ? x 1  ? UTI (lower urinary tract infection) 11/22/2011  ? started abx on 11/24/11  ? ? ?Past Surgical History:  ?Procedure Laterality Date  ? CHOLECYSTECTOMY    ? DILATATION & CURRETTAGE/HYSTEROSCOPY WITH RESECTOCOPE  11/30/2011  ? Procedure: [place];  Surgeon: Peri Maris, MD;  Location: [facility];  Service: Gynecology;  Laterality: N/A;  ? DILATION AND CURETTAGE OF UTERUS    ? LYMPH NODE DISSECTION  12/14/2011  ? Procedure: LYMPH NODE DISSECTION;  Surgeon: Imagene Gurney A. Alycia Rossetti, MD;  Location: [facility];  Service: Gynecology;  Laterality: N/A;  ? ROBOTIC ASSISTED TOTAL HYSTERECTOMY WITH BILATERAL SALPINGO OOPHERECTOMY  12/14/2011  ? Procedure: ROBOTIC ASSISTED TOTAL HYSTERECTOMY WITH BILATERAL SALPINGO OOPHORECTOMY;  Surgeon: Imagene Gurney A. Alycia Rossetti, MD;  Location: [facility];  Service: Gynecology;  Laterality: N/A;  ? WISDOM  TOOTH EXTRACTION    ? ? ?Social History: ? reports that she has never smoked. She has never used smokeless tobacco. She reports that she does not drink alcohol and does not use drugs. ? ?Allergies  ?Allergen Reactions  ? Penicillin G Anaphylaxis  ? Doxycycline Hyclate Nausea Only  ? Shrimp [Shellfish Allergy] Nausea And Vomiting  ? Sinutab Sinus Max  St [Phenylephrine-Acetaminophen] Swelling  ? Valacyclovir Other (See Comments)  ?  Confusion  ? ? ?Family History  ?Problem Relation Age of Onset  ? Heart disease Mother   ? Heart disease Father   ? Diabetes Sister   ?     decline after fall  ? Other Sister   ?     old age, delcined after husband loss  ? Diabetes Sister   ?     later had fall  ? Cancer Paternal Aunt   ?     started in arm reportedly  ? ? ? ?Prior to Admission medications   ?Medication Sig Start Date End Date Taking? Authorizing Provider  ?acetaminophen (TYLENOL) 500 MG tablet Take 500 mg by mouth every 6 (six) hours as needed for mild pain or moderate pain.    [provider]  ?cefdinir (OMNICEF) 300 MG capsule Take 1 capsule (300 mg total) by mouth 2 (two) times daily. 04/15/21   Vivi Barrack, MD  ?doxazosin (CARDURA) 2 MG tablet Take 1 tablet (2 mg total) by mouth at bedtime. 08/26/20   Marin Olp, MD  ?famotidine (PEPCID) 20 MG tablet Take 20 mg by mouth daily. 07/01/20   [provider]  ?glimepiride (AMARYL) 4 MG tablet Take 1 tablet (4 mg total) by mouth 2 (two) times daily. 08/26/20   Marin Olp, MD  ?mometasone (ELOCON) 0.1 % lotion Apply 1 application topically daily. 05/28/20   [provider]  ?triamterene-hydrochlorothiazide (MAXZIDE-25) 37.5-25 MG tablet Take one tablet daily in the morning. 08/29/20   Marin Olp, MD  ? ? ?Physical Exam: ?Vitals:  ? 04/15/21 2132 04/15/21 2135 04/15/21 2207 04/15/21 2211  ?BP:    (!) 125/53  ?Pulse:  98  (!) 106  ?Resp:  (!) 22  18  ?Temp: 98.2 ?F (36.8 ?C)   99.4 ?F (37.4 ?C)  ?TempSrc: Oral     ?SpO2:  97% 95% 96%  ?Weight:      ?Height:      ? ?Constitutional: NAD, calm, comfortable ?Eyes: PERRL, lids and conjunctivae normal ?ENMT: Mucous membranes are moist. Posterior pharynx clear of any exudate or lesions.Normal dentition.  ?Neck: normal, supple, no masses. ?Respiratory: clear to auscultation bilaterally, no wheezing, no crackles. Normal respiratory  effort. No accessory muscle use.  ?Cardiovascular: Regular rate and rhythm, no murmurs / rubs / gallops. No extremity edema. 2+ pedal pulses. ?Abdomen: no tenderness, no masses palpated. No hepatosplenomegaly. Bowel sounds positive.  ?Musculoskeletal: no clubbing / cyanosis. No joint deformity upper and lower extremities. Good ROM, no contractures. Normal muscle tone.  ?Skin: no rashes, lesions, ulcers. No induration ?Neurologic: CN 2-12 grossly intact. Sensation intact. Strength 5/5 in all 4.  ?Psychiatric: Alert and oriented x 3. Normal mood.  ? ?EKG: Personally reviewed. Sinus tachycardia with, rate 101, PAC, no acute ischemic changes.  PVC no longer present when compared to prior. ? ?Assessment/Plan ?Principal Problem: ?  Sepsis due to urinary tract infection ([place]) ?Active Problems: ?  Hypertension associated with diabetes ([place]) ?  Chronic kidney disease, stage 3a ([place]) ?  Diabetes mellitus, type 2 ([place]) ?  Hyperlipidemia associated with type 2 diabetes mellitus ([place]) ?  History of TIA (transient ischemic attack) ?  ?ALISCIA CLAYTON is a 76 y.o. female with medical history significant for T2DM, HTN, CKD stage IIIa, HLD, GERD who is admitted with sepsis due to UTI. ? ?Assessment and Plan: ?* Sepsis due to urinary tract infection ([place]) ?Presenting with leukocytosis, tachycardia, tachypnea, elevated lactic acid and presumed urinary source of infection. ?-Continue empiric IV ceftriaxone ?-Follow urine cultures and blood cultures ?-Continue IV fluid hydration overnight ? ?Hypertension associated with diabetes ([place]) ?Hold home antihypertensives with soft blood pressures on admission. ? ?Chronic kidney disease, stage 3a ([place]) ?Creatinine slightly increased from baseline in setting of urosepsis.  Continue IV fluids and antibiotics as above and repeat labs in AM.  Holding home triamterene-HCTZ. ? ?Diabetes mellitus, type 2 ([place]) ?Hold home Amaryl and place on SSI. ? ?Hyperlipidemia associated with type 2 diabetes  mellitus ([place]) ?Not currently on statin therapy. ? ?History of TIA (transient ischemic attack) ?Recent admit for transient aphasia, presumed TIA.  Continue aspirin 81 mg daily. ? ?DVT prophylaxis: enoxaparin (LOVE

## 2021-04-15 NOTE — ED Notes (Signed)
Transport called.

## 2021-04-15 NOTE — Telephone Encounter (Signed)
See below. Work pt in? ?

## 2021-04-15 NOTE — Telephone Encounter (Signed)
I recommend patient go immediately to emergency room with white count elevation this high- please call and instruct her to go to the ED ?

## 2021-04-15 NOTE — Assessment & Plan Note (Addendum)
Met criteria for severe sepsis with leukocytosis, tachycardia, tachypnea, lactic acidosis and UTI.  Procalcitonin elevated to 8.73.  Lactic acidosis resolved.  Symptoms and leukocytosis improving.  Blood cultures NGTD.  Urine culture pending. ?-Continue empiric IV ceftriaxone pending urine culture ?-Continue IV fluid.  Change LR to NS for hyponatremia. ?-May need prophylactic antibiotic in the future for recurrent UTI ?

## 2021-04-15 NOTE — Assessment & Plan Note (Addendum)
LDL 79 last months.  Consider statin given diabetes ?

## 2021-04-15 NOTE — ED Triage Notes (Signed)
Per pt, states she ate out last Thursday and later got sick to her stomach and started vomiting-states symptoms lasted a couple of days-states this am she vomited a small amount, had her labs drawn and was later called by PCP and informed her WBC's were extremely high and told to come to ED ?

## 2021-04-15 NOTE — Hospital Course (Addendum)
Meagan [name] is a 76 y.o. female with medical history significant for T2DM, HTN, CKD stage IIIa, HLD, GERD who is admitted with severe sepsis due to UTI.  Cultures obtained.  Started on IV ceftriaxone. ? ?Urine culture with insignificant growth.  BC ID with E. coli but no ESBL detected.  Blood culture with GNR in 1 out of 2 aerobic bottles.  Ceftriaxone increased to 2 g daily.  Patient's symptoms resolved and she felt well and ready to go home.  She is discharged on p.o. cefpodoxime 200 mg twice daily for 6 more days to complete a total of 7 days course for E. coli bacteremia per ID pharmacy recommendation. ? ?Of note, patient remained normotensive off home Maxzide and Cardura, which were discontinued on discharge.  She reports taking Maxzide for leg swelling the blood pressure.  Cardura can increase the risk of edema.  May consider as needed diuretics for leg swelling if she remains normotensive off of those medications. ? ?

## 2021-04-15 NOTE — Telephone Encounter (Signed)
Access nurse called - Stated patient called with chief complain of vomiting and chills. Patient is a diabetic. Patient is having high blood sugar. Patient refused going to ED or Urgent care.  ? ?Please advise.  ?

## 2021-04-15 NOTE — Telephone Encounter (Signed)
Called and spoke with pt and gave Dr. Ronney Lion recommendations. I informed pt that this level is extremely high and could indicate serious infection, pt declined and said she is fine and will take the medicines that she has and hung up.  ?

## 2021-04-15 NOTE — Assessment & Plan Note (Addendum)
Uncontrolled with hyperglycemia and CKD-3A.  Last A1c 8.9% about a month ago. ?Recent Labs  ?Lab 04/16/21 ?1137 04/16/21 ?1729 04/16/21 ?2040 04/17/21 ?0714 04/17/21 ?1133  ?GLUCAP 179* 132* 163* 145* 182*  ?-Continue home glimepiride. ?-Consider adding additional antihyperglycemic agent and statin. ?

## 2021-04-15 NOTE — Progress Notes (Signed)
? ?  Meagan [name] is a 76 y.o. female who presents today for an office visit. ? ?Assessment/Plan:  ?New/Acute Problems: ?UTI ?She is having a few systemic symptoms including fevers, chills, and nausea-concern for early pyelonephritis.  She currently appears well though her blood pressure is a bit low and heart rate is elevated.  Do not think she needs emergent care at this point.  She is afebrile in the office.  We discussed IM ceftriaxone today however she declined.  Her last urine culture showed E. coli that was resistant to fluoroquinolones.  We will start [place].  She does have a history of penicillin allergy though has tolerated cephalosporins recently.  We encourage good hydration.  We will check urine culture as well as labs today.  We discussed reasons to return to care and seek emergent care. ? ?Chronic Problems Addressed Today: ?DM -last A1c not controlled at 8.9.  Increases her risk for complicated infection.  ? ?HTN - Soft today at 108/67.  We will continue current regimen triamterene-hydrochlorothiazide 37.5-25 once daily.  Check labs as above.  Discussed importance of good hydration. ? ? ?  ?Subjective:  ?HPI: ? ?Patient here with chills and nausea since this morning. She had similar episode last week that resolved after a day.  No diarrhea.  She had 1 episode of vomiting this morning.  She had a fever to 101 ?F this morning.  Took Tylenol and fever went away.  No back pain.  She feels like she may have a UTI.  Feels discomfort with urinating.  No noted hematuria.  She feels better now that her fever has broke after taking Tylenol.  No abdominal pain.  She has been trying to stay hydrated. ? ? ?   ?  ?Objective:  ?Physical Exam: ?BP 108/67 (BP Location: Right Arm)   Pulse (!) 120   Temp 98.1 ?F (36.7 ?C) (Temporal)   Ht '5\' 2"'$  (1.575 m)   Wt 163 lb 3.2 oz (74 kg)   LMP 01/18/2001   SpO2 97%   BMI 29.85 kg/m?   ?Gen: No acute distress, resting comfortably ?CV: Regular rate and rhythm with no  murmurs appreciated ?Pulm: Normal work of breathing, clear to auscultation bilaterally with no crackles, wheezes, or rhonchi ?GI: S, NT, ND ?MSK: No CVA tenderness ?Neuro: Grossly normal, moves all extremities ?Psych: Normal affect and thought content ? ?   ? ?Algis Greenhouse. [name], MD ?04/15/2021 11:29 AM  ? ?

## 2021-04-15 NOTE — Assessment & Plan Note (Addendum)
Recent admit for transient aphasia, presumed TIA.   ?-Continue aspirin 81 mg daily. ?-Consider adding statin. ?

## 2021-04-15 NOTE — ED Provider Notes (Addendum)
[facility]-EMERGENCY DEPT Provider Note   CSN: [id number] Arrival date & time: 04/15/21  1719     History  Chief Complaint  Patient presents with   abnormal labs    Meagan [name] is a 76 y.o. female.  Patient with recent nausea, emesis - had episode last week but then felt fine for a week - nausea/vomiting, malaise returned this AM with generally feeling weak. . No bloody or bilious emesis. Urination felt 'different' but no burning, pain or frequency. No abd pain. No headache. No sinus pain, sore throat, cough or uri symptoms. No chest pain or sob. No extremity pain, redness, or skin lesions or rash. Went to pcp, was told possible uti, given po abx - took one dose - was called and told wbc high and to go to ER.   The history is provided by the patient, the spouse and medical records.      Home Medications Prior to Admission medications   Medication Sig Start Date End Date Taking? Authorizing Provider  acetaminophen (TYLENOL) 500 MG tablet Take 500 mg by mouth every 6 (six) hours as needed for mild pain or moderate pain.    [provider]  cefdinir (OMNICEF) 300 MG capsule Take 1 capsule (300 mg total) by mouth 2 (two) times daily. 04/15/21   Ardith Dark, MD  doxazosin (CARDURA) 2 MG tablet Take 1 tablet (2 mg total) by mouth at bedtime. 08/26/20   Shelva Majestic, MD  famotidine (PEPCID) 20 MG tablet Take 20 mg by mouth daily. 07/01/20   [provider]  glimepiride (AMARYL) 4 MG tablet Take 1 tablet (4 mg total) by mouth 2 (two) times daily. 08/26/20   Shelva Majestic, MD  mometasone (ELOCON) 0.1 % lotion Apply 1 application topically daily. 05/28/20   [provider]  triamterene-hydrochlorothiazide (MAXZIDE-25) 37.5-25 MG tablet Take one tablet daily in the morning. 08/29/20   Shelva Majestic, MD      Allergies    Penicillin g, Doxycycline hyclate, Shrimp [shellfish allergy], Sinutab sinus max st  [phenylephrine-acetaminophen], and Valacyclovir    Review of Systems   Review of Systems  Constitutional:  Negative for chills and fever.  HENT:  Negative for sore throat.   Eyes:  Negative for pain, redness and visual disturbance.  Respiratory:  Negative for cough and shortness of breath.   Cardiovascular:  Negative for chest pain.  Gastrointestinal:  Positive for nausea and vomiting. Negative for abdominal pain and diarrhea.  Genitourinary:  Negative for dysuria and flank pain.  Musculoskeletal:  Negative for back pain, neck pain and neck stiffness.  Skin:  Negative for rash.  Neurological:  Positive for weakness. Negative for speech difficulty, numbness and headaches.  Hematological:  Does not bruise/bleed easily.  Psychiatric/Behavioral:  Negative for confusion.    Physical Exam Updated Vital Signs BP (!) 111/49 (BP Location: Left Arm)   Pulse (!) 119   Temp 98.4 F (36.9 C) (Oral)   Resp 18   LMP 01/18/2001   SpO2 94%  Physical Exam Vitals and nursing note reviewed.  Constitutional:      Appearance: Normal appearance. She is well-developed.  HENT:     Head: Atraumatic.     Nose: Nose normal.     Mouth/Throat:     Mouth: Mucous membranes are moist.  Eyes:     General: No scleral icterus.    Conjunctiva/sclera: Conjunctivae normal.  Neck:     Trachea: No tracheal deviation.  Comments: No stiffness or rigidity.  Cardiovascular:     Rate and Rhythm: Regular rhythm. Tachycardia present.     Pulses: Normal pulses.     Heart sounds: Normal heart sounds. No murmur heard.   No friction rub. No gallop.  Pulmonary:     Effort: Pulmonary effort is normal. No respiratory distress.     Breath sounds: Normal breath sounds.  Abdominal:     General: Bowel sounds are normal. There is no distension.     Palpations: Abdomen is soft. There is no mass.     Tenderness: There is no abdominal tenderness. There is no guarding.  Genitourinary:    Comments: No cva tenderness.   Musculoskeletal:        General: No swelling or tenderness.     Cervical back: Normal range of motion and neck supple. No rigidity. No muscular tenderness.     Right lower leg: No edema.     Left lower leg: No edema.  Skin:    General: Skin is warm and dry.     Findings: No rash.  Neurological:     Mental Status: She is alert.     Comments: Alert, speech normal.   Psychiatric:        Mood and Affect: Mood normal.    ED Results / Procedures / Treatments   Labs (all labs ordered are listed, but only abnormal results are displayed) Results for orders placed or performed during the hospital encounter of 04/15/21  Lactic acid, plasma  Result Value Ref Range   Lactic Acid, Venous 2.4 (HH) 0.5 - 1.9 mmol/L  CBC with Differential/Platelet  Result Value Ref Range   WBC 25.2 (H) 4.0 - 10.5 K/uL   RBC 4.69 3.87 - 5.11 MIL/uL   Hemoglobin 12.5 12.0 - 15.0 g/dL   HCT 41.3 24.4 - 01.0 %   MCV 81.4 80.0 - 100.0 fL   MCH 26.7 26.0 - 34.0 pg   MCHC 32.7 30.0 - 36.0 g/dL   RDW 27.2 53.6 - 64.4 %   Platelets 292 150 - 400 K/uL   nRBC 0.0 0.0 - 0.2 %   Neutrophils Relative % 92 %   Neutro Abs 23.1 (H) 1.7 - 7.7 K/uL   Lymphocytes Relative 4 %   Lymphs Abs 1.0 0.7 - 4.0 K/uL   Monocytes Relative 3 %   Monocytes Absolute 0.6 0.1 - 1.0 K/uL   Eosinophils Relative 0 %   Eosinophils Absolute 0.1 0.0 - 0.5 K/uL   Basophils Relative 0 %   Basophils Absolute 0.1 0.0 - 0.1 K/uL   RBC Morphology MORPHOLOGY UNREMARKABLE    Immature Granulocytes 1 %   Abs Immature Granulocytes 0.26 (H) 0.00 - 0.07 K/uL  Comprehensive metabolic panel  Result Value Ref Range   Sodium 134 (L) 135 - 145 mmol/L   Potassium 3.8 3.5 - 5.1 mmol/L   Chloride 100 98 - 111 mmol/L   CO2 23 22 - 32 mmol/L   Glucose, Bld 203 (H) 70 - 99 mg/dL   BUN 34 (H) 8 - 23 mg/dL   Creatinine, Ser 0.34 (H) 0.44 - 1.00 mg/dL   Calcium 8.7 (L) 8.9 - 10.3 mg/dL   Total Protein 7.9 6.5 - 8.1 g/dL   Albumin 3.2 (L) 3.5 - 5.0 g/dL    AST 24 15 - 41 U/L   ALT 27 0 - 44 U/L   Alkaline Phosphatase 83 38 - 126 U/L   Total Bilirubin 0.9 0.3 - 1.2 mg/dL  GFR, Estimated 39 (L) >60 mL/min   Anion gap 11 5 - 15     EKG EKG Interpretation  Date/Time:  Wednesday April 15 2021 20:40:33 EDT Ventricular Rate:  101 PR Interval:  174 QRS Duration: 83 QT Interval:  330 QTC Calculation: 428 R Axis:   24 Text Interpretation: Sinus tachycardia Atrial premature complex Confirmed by Cathren Laine ([id number]) on 04/15/2021 8:44:06 PM  Radiology DG Chest Port 1 View  Result Date: 04/15/2021 CLINICAL DATA:  Fever.  Elevated white blood cell count. EXAM: PORTABLE CHEST 1 VIEW COMPARISON:  AP chest 01/03/2020 FINDINGS: Cardiac silhouette and mediastinal contours are within normal limits. Mild calcification within aortic arch. Mildly decreased lung volumes. Mild chronic interstitial thickening is unchanged. No pleural effusion or pneumothorax. No acute skeletal abnormality. IMPRESSION: No active disease. Electronically Signed   By: Neita Garnet M.D.   On: 04/15/2021 18:45    Procedures Procedures    Medications Ordered in ED Medications  lactated ringers bolus 1,000 mL (has no administration in time range)  cefTRIAXone (ROCEPHIN) 1 g in sodium chloride 0.9 % 100 mL IVPB (has no administration in time range)    ED Course/ Medical Decision Making/ A&P                           Medical Decision Making Problems Addressed: AKI (acute kidney injury) [facility]): acute illness or injury Elevated lactic acid level: acute illness or injury with systemic symptoms that poses a threat to life or bodily functions Leukocytosis, unspecified type: acute illness or injury Sepsis due to urinary tract infection (HCC): acute illness or injury with systemic symptoms that poses a threat to life or bodily functions  Amount and/or Complexity of Data Reviewed Independent Historian: spouse    Details: hx External Data Reviewed: labs and notes. Labs: ordered.  Decision-making details documented in ED Course. Radiology: ordered and independent interpretation performed. Decision-making details documented in ED Course. ECG/medicine tests: ordered and independent interpretation performed. Decision-making details documented in ED Course. Discussion of management or test interpretation with external provider(s): Hospitalists - discussed pt, labs.   Risk OTC drugs. Prescription drug management. Decision regarding hospitalization.   Iv ns. Continuous pulse ox and cardiac monitoring. Labs ordered/sent. Imaging ordered.   Reviewed nursing notes and prior charts for additional history. External reports reviewed. Additional history from: spouse.   Cardiac monitor: sinus rhythm, rate 118.   Labs reviewed/interpreted by me - wbc high, lactate mildly high, mild bump in cr compared to baseline.   Xrays reviewed/interpreted by me - no pna.  LR boluses iv.   Given marked leucocytosis, elev lactate, mild elev cr, mildly tachy, uti - will admit for ivf/iv abx, obs.   Hospitalists consulted for admission.             Final Clinical Impression(s) / ED Diagnoses Final diagnoses:  None    Rx / DC Orders ED Discharge Orders     None          Cathren Laine, MD 04/15/21 2044

## 2021-04-15 NOTE — Telephone Encounter (Signed)
Patient husband called, stated he will be going to grandsons baseball game first, THEN to [facility] ed.  ?

## 2021-04-15 NOTE — Assessment & Plan Note (Addendum)
Normotensive off home Maxide and Cardura.  Patient reports taking Maxzide for leg swelling than  hypertension.  Cardura can worsen leg swelling. ?-Discontinued home Maxzide and Cardura ?

## 2021-04-15 NOTE — Telephone Encounter (Signed)
I am happy to work her in - would have to come and wait if she prefers to see me.  ?- if she prefers to keep visit with Dr. [name] at 11 that's also ok ?

## 2021-04-15 NOTE — ED Notes (Signed)
Patient provided with warm blanket for comfort.  Updated on plan of care ?

## 2021-04-15 NOTE — Telephone Encounter (Signed)
Husband called office wanting to know what was going on - patient and husband declined going to ED>- call transferred to [facility] [place] .  ?

## 2021-04-15 NOTE — Assessment & Plan Note (Addendum)
Recent Labs  ?  05/27/20 ?0000 09/25/20 ?0842 02/26/21 ?1016 03/02/21 ?2003 03/02/21 ?2037 04/15/21 ?1131 04/15/21 ?1743 04/16/21 ?0314 04/17/21 ?7169  ?BUN 22* 39* 36* 22 25* 29* 34* 28* 21  ?CREATININE 1.0 1.22* 1.26* 1.22* 1.10* 1.33* 1.41* 1.15* 0.99  ?Discontinued Maxzide ?-Recheck renal function at follow-up ?

## 2021-04-15 NOTE — Patient Instructions (Addendum)
It was very nice to see you today! ? ?I am worried that you may be getting an infection in your kidneys.  Please start the antibiotic.  We will check blood work and urine sample today.  Let us know if not improving.  We will contact you in a couple of days once we have your results back. ? ?Take care, ?Dr [name] ? ?PLEASE NOTE: ? ?If you had any lab tests please let us know if you have not heard back within a few days. You may see your results on mychart before we have a chance to review them but we will give you a call once they are reviewed by [place]. If we ordered any referrals today, please let us know if you have not heard from their office within the next week.  ? ?Please try these tips to maintain a healthy lifestyle: ? ?Eat at least 3 REAL meals and 1-2 snacks per day.  Aim for no more than 5 hours between eating.  If you eat breakfast, please do so within one hour of getting up.  ? ?Each meal should contain half fruits/vegetables, one quarter protein, and one quarter carbs (no bigger than a computer mouse) ? ?Cut down on sweet beverages. This includes juice, soda, and sweet tea.  ? ?Drink at least 1 glass of water with each meal and aim for at least 8 glasses per day ? ?Exercise at least 150 minutes every week.   ?

## 2021-04-15 NOTE — Telephone Encounter (Signed)
Refused ED- Please advise  ? ?Patient ?Name: ?Meagan RO ?[name] ?Gender: Female ?DOB: 24-Jun-1945 ?Age: 76 Y 5 M 24 D ?Return ?Phone ?Number: ?[phone] ?(Primary), ?[id number] ?(Secondary) ?Address: ?City/ ?State/ ?Zip: ?[place] ? 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 ?Hx of UTI but completed treatment. ?Translation No ?Nurse Assessment ?Nurse: Sheppard Plumber, RN, Estill Bamberg Date/Time (Eastern Time): 04/15/2021 8:16:15 AM ?Confirm and document reason for call. If ?symptomatic, describe symptoms. ?---caller states she vomited one time this morning, ?chills but stopped, weak. ?Does the patient have any new or worsening ?symptoms? ---Yes ?Will a triage be completed? ---Yes ?Related visit to physician within the last 2 weeks? ---No ?Does the PT have any chronic conditions? (i.e. ?diabetes, asthma, this includes High risk factors for ?pregnancy, etc.) ?---Yes ?List chronic conditions. ---DM ?Is this a behavioral health or substance abuse call? ---No ?Guidelines ?Guideline Title Affirmed Question Affirmed Notes Nurse Date/Time (Eastern ?Time) ?Vomiting High-risk adult (e.g., ?diabetes mellitus, ?brain tumor, V-P ?shunt, hernia) ?[place], RN, ?Estill Bamberg ?04/15/2021 8:18:51 ?AM ?Disp. Time (Eastern ?Time) Disposition Final User ?04/15/2021 8:20:44 AM Go to ED Now (or PCP triage) Yes [place], RN, Estill Bamberg ?PLEASE NOTE: All timestamps contained within this report are represented as [place] Standard Time. ?CONFIDENTIALTY NOTICE: This fax transmission is intended only for the addressee. It contains  information that is legally privileged, confidential or ?otherwise protected from use or disclosure. If you are not the intended recipient, you are strictly prohibited from reviewing, disclosing, copying using ?or disseminating any of this information or taking any action in reliance on or regarding this information. If you have received this fax in error, please ?notify us immediately by telephone so that we can arrange for its return to [place]. Phone: [phone], Toll-Free: [phone], Fax: [phone] ?Page: 2 of 2 ?Call Id: [id number] ?Caller Disagree/Comply Comply ?Caller Understands Yes ?PreDisposition Call Doctor ?Care Advice Given Per Guideline ?GO TO ED NOW (OR PCP TRIAGE): * IF NO PCP (PRIMARY CARE PROVIDER) SECOND-LEVEL TRIAGE: You need to be ?seen within the next hour. Go to the [facility] at _____________ [place] as soon as you can. CARE ADVICE per Vomiting ?(Adult) guideline. ?Comments ?User: Rozelle Logan, RN Date/Time Eilene Ghazi Time): 04/15/2021 8:21:39 AM ?no pain or burning with urination. has urgency ?User: Rozelle Logan, RN Date/Time Eilene Ghazi Time): 04/15/2021 8:24:54 AM ?called backline - gave patient information/triage patient declined ER/UC and wants appointment in office ?Referrals ?GO TO FACILITY REFUSED ?

## 2021-04-16 ENCOUNTER — Other Ambulatory Visit: Payer: Self-pay

## 2021-04-16 ENCOUNTER — Encounter (HOSPITAL_COMMUNITY): Payer: Self-pay | Admitting: Internal Medicine

## 2021-04-16 DIAGNOSIS — E1169 Type 2 diabetes mellitus with other specified complication: Secondary | ICD-10-CM | POA: Diagnosis not present

## 2021-04-16 DIAGNOSIS — R6520 Severe sepsis without septic shock: Secondary | ICD-10-CM | POA: Diagnosis present

## 2021-04-16 DIAGNOSIS — Z91013 Allergy to seafood: Secondary | ICD-10-CM | POA: Diagnosis not present

## 2021-04-16 DIAGNOSIS — R7881 Bacteremia: Secondary | ICD-10-CM | POA: Diagnosis not present

## 2021-04-16 DIAGNOSIS — K219 Gastro-esophageal reflux disease without esophagitis: Secondary | ICD-10-CM | POA: Diagnosis present

## 2021-04-16 DIAGNOSIS — E1159 Type 2 diabetes mellitus with other circulatory complications: Secondary | ICD-10-CM | POA: Diagnosis not present

## 2021-04-16 DIAGNOSIS — A419 Sepsis, unspecified organism: Secondary | ICD-10-CM | POA: Diagnosis not present

## 2021-04-16 DIAGNOSIS — Z7984 Long term (current) use of oral hypoglycemic drugs: Secondary | ICD-10-CM | POA: Diagnosis not present

## 2021-04-16 DIAGNOSIS — D649 Anemia, unspecified: Secondary | ICD-10-CM | POA: Diagnosis present

## 2021-04-16 DIAGNOSIS — A4151 Sepsis due to Escherichia coli [E. coli]: Secondary | ICD-10-CM | POA: Diagnosis not present

## 2021-04-16 DIAGNOSIS — Z79899 Other long term (current) drug therapy: Secondary | ICD-10-CM | POA: Diagnosis not present

## 2021-04-16 DIAGNOSIS — E872 Acidosis, unspecified: Secondary | ICD-10-CM

## 2021-04-16 DIAGNOSIS — N3001 Acute cystitis with hematuria: Secondary | ICD-10-CM | POA: Diagnosis not present

## 2021-04-16 DIAGNOSIS — N1831 Chronic kidney disease, stage 3a: Secondary | ICD-10-CM | POA: Diagnosis not present

## 2021-04-16 DIAGNOSIS — E1122 Type 2 diabetes mellitus with diabetic chronic kidney disease: Secondary | ICD-10-CM | POA: Diagnosis present

## 2021-04-16 DIAGNOSIS — Z8673 Personal history of transient ischemic attack (TIA), and cerebral infarction without residual deficits: Secondary | ICD-10-CM | POA: Diagnosis not present

## 2021-04-16 DIAGNOSIS — Z880 Allergy status to penicillin: Secondary | ICD-10-CM | POA: Diagnosis not present

## 2021-04-16 DIAGNOSIS — E1129 Type 2 diabetes mellitus with other diabetic kidney complication: Secondary | ICD-10-CM | POA: Diagnosis not present

## 2021-04-16 DIAGNOSIS — D72825 Bandemia: Secondary | ICD-10-CM

## 2021-04-16 DIAGNOSIS — Z881 Allergy status to other antibiotic agents status: Secondary | ICD-10-CM | POA: Diagnosis not present

## 2021-04-16 DIAGNOSIS — Z888 Allergy status to other drugs, medicaments and biological substances status: Secondary | ICD-10-CM | POA: Diagnosis not present

## 2021-04-16 DIAGNOSIS — E871 Hypo-osmolality and hyponatremia: Secondary | ICD-10-CM | POA: Diagnosis present

## 2021-04-16 DIAGNOSIS — N179 Acute kidney failure, unspecified: Secondary | ICD-10-CM | POA: Diagnosis present

## 2021-04-16 DIAGNOSIS — I152 Hypertension secondary to endocrine disorders: Secondary | ICD-10-CM

## 2021-04-16 DIAGNOSIS — E1165 Type 2 diabetes mellitus with hyperglycemia: Secondary | ICD-10-CM

## 2021-04-16 DIAGNOSIS — I129 Hypertensive chronic kidney disease with stage 1 through stage 4 chronic kidney disease, or unspecified chronic kidney disease: Secondary | ICD-10-CM | POA: Diagnosis present

## 2021-04-16 DIAGNOSIS — N390 Urinary tract infection, site not specified: Secondary | ICD-10-CM | POA: Diagnosis present

## 2021-04-16 DIAGNOSIS — E785 Hyperlipidemia, unspecified: Secondary | ICD-10-CM

## 2021-04-16 LAB — BASIC METABOLIC PANEL
Anion gap: 7 (ref 5–15)
BUN: 28 mg/dL — ABNORMAL HIGH (ref 8–23)
CO2: 24 mmol/L (ref 22–32)
Calcium: 8.2 mg/dL — ABNORMAL LOW (ref 8.9–10.3)
Chloride: 102 mmol/L (ref 98–111)
Creatinine, Ser: 1.15 mg/dL — ABNORMAL HIGH (ref 0.44–1.00)
GFR, Estimated: 50 mL/min — ABNORMAL LOW (ref >60)
Glucose, Bld: 158 mg/dL — ABNORMAL HIGH (ref 70–99)
Potassium: 3.6 mmol/L (ref 3.5–5.1)
Sodium: 133 mmol/L — ABNORMAL LOW (ref 135–145)

## 2021-04-16 LAB — CBC
HCT: 34.1 % — ABNORMAL LOW (ref 36.0–46.0)
Hemoglobin: 11.1 g/dL — ABNORMAL LOW (ref 12.0–15.0)
MCH: 26.8 pg (ref 26.0–34.0)
MCHC: 32.6 g/dL (ref 30.0–36.0)
MCV: 82.4 fL (ref 80.0–100.0)
Platelets: 222 10*3/uL (ref 150–400)
RBC: 4.14 MIL/uL (ref 3.87–5.11)
RDW: 14 % (ref 11.5–15.5)
WBC: 17.4 10*3/uL — ABNORMAL HIGH (ref 4.0–10.5)
nRBC: 0 % (ref 0.0–0.2)

## 2021-04-16 LAB — GLUCOSE, CAPILLARY
Glucose-Capillary: 144 mg/dL — ABNORMAL HIGH (ref 70–99)
Glucose-Capillary: 125 mg/dL — ABNORMAL HIGH (ref 70–99)
Glucose-Capillary: 179 mg/dL — ABNORMAL HIGH (ref 70–99)
Glucose-Capillary: 132 mg/dL — ABNORMAL HIGH (ref 70–99)
Glucose-Capillary: 163 mg/dL — ABNORMAL HIGH (ref 70–99)

## 2021-04-16 LAB — PROCALCITONIN: Procalcitonin: 8.73 ng/mL

## 2021-04-16 MED ORDER — SODIUM CHLORIDE 0.9 % IV SOLN: INTRAVENOUS | Status: DC

## 2021-04-16 NOTE — Telephone Encounter (Signed)
Noted. Patient currently admitted ? ?Meagan [name]. [name], MD ?04/16/2021 8:06 AM  ? ?

## 2021-04-16 NOTE — Progress Notes (Incomplete)
Report received from [facility] RN/ED and pt received to rm 1325 via stretcher and pt ambulated to bed without difficulty. Education initiated on use of call  bell for needs/safety ?

## 2021-04-16 NOTE — Assessment & Plan Note (Addendum)
WBC down from 30-11. ?

## 2021-04-16 NOTE — Progress Notes (Signed)
Labs reviewed. She is currently admitted for presumed urosepsis.

## 2021-04-16 NOTE — Assessment & Plan Note (Addendum)
Recent Labs  ?  05/27/20 ?0000 09/25/20 ?0842 02/26/21 ?1016 03/02/21 ?2003 03/02/21 ?2037 04/15/21 ?1131 04/15/21 ?1743 04/16/21 ?0314 04/17/21 ?4599  ?HGB 14.1 13.5 13.2 13.6 14.6 13.1 12.5 11.1* 10.7*  ?Slight drop in Hgb likely dilutional from IV fluid.  Anemia panel with borderline folic acid and H74. ?-Received B12 injection prior to discharge ?-Discharged on p.o. folic acid and F42. ? ?

## 2021-04-16 NOTE — Assessment & Plan Note (Addendum)
Improved

## 2021-04-16 NOTE — Progress Notes (Signed)
?  Transition of Care (TOC) Screening Note ? ? ?Patient Details  ?Name: Meagan [name] ?Date of Birth: [birth year] ? ? ?Transition of Care (TOC) CM/SW Contact:    ?Velina Drollinger, LCSW ?Phone Number: ?04/16/2021, 11:31 AM ? ? ? ?Transition of Care Department [facility]) has reviewed patient and no TOC needs have been identified at this time. We will continue to monitor patient advancement through interdisciplinary progression rounds. If new patient transition needs arise, please place a TOC consult. ? ? ?

## 2021-04-16 NOTE — Progress Notes (Signed)
?PROGRESS NOTE ? ?Meagan [name] [id number]:[id number] DOB: [birth year]  ? ?PCP: Marin Olp, MD ? ?Patient is from: Home.  Lives with family.  Independently ambulates at baseline. ? ?DOA: 04/15/2021 LOS: 0 ? ?Chief complaints ?Chief Complaint  ?Patient presents with  ? abnormal labs  ?  ? ?Brief Narrative / Interim history: ?Meagan [name] is a 76 y.o. female with medical history significant for T2DM, HTN, CKD stage IIIa, HLD, GERD who is admitted with severe sepsis due to UTI.  Cultures obtained.  Started on IV ceftriaxone.  ? ?Subjective: ?Seen and examined earlier this morning.  Patient's son and husband at bedside.  No major events overnight of this morning.  Feels better this morning.  No complaints. ? ?Objective: ?Vitals:  ? 04/15/21 2211 04/16/21 0111 04/16/21 0524 04/16/21 4481  ?BP: (!) 125/53 (!) 128/52 (!) 110/49 (!) 132/53  ?Pulse: (!) 106 (!) 108 99 93  ?Resp: _0 ?Temp: 99.4 ?F (37.4 ?C) 100 ?F (37.8 ?C) 99.5 ?F (37.5 ?C) 99 ?F (37.2 ?C)  ?TempSrc:      ?SpO2: 96% 91% 94% 93%  ?Weight:      ?Height:      ? ? ?Examination: ? ?GENERAL: No apparent distress.  Nontoxic. ?HEENT: MMM.  Vision and hearing grossly intact.  ?NECK: Supple.  No apparent JVD.  ?RESP:  No IWOB.  Fair aeration bilaterally. ?CVS:  RRR. Heart sounds normal.  ?ABD/GI/GU: BS+. Abd soft, NTND.  No CVA tenderness. ?MSK/EXT:  Moves extremities. No apparent deformity. No edema.  ?SKIN: no apparent skin lesion or wound ?NEURO: Awake, alert and oriented appropriately.  No apparent focal neuro deficit. ?PSYCH: Calm. Normal affect.  ? ?Procedures:  ?None ? ?Microbiology summarized: ?Blood cultures NGTD. ?Urine culture pending ? ?Assessment and Plan: ?* Severe sepsis due to urinary tract infection ?Met criteria for severe sepsis with leukocytosis, tachycardia, tachypnea, lactic acidosis and UTI.  Procalcitonin elevated to 8.73.  Lactic acidosis resolved.  Symptoms and leukocytosis improving.  Blood cultures NGTD.  Urine culture  pending. ?-Continue empiric IV ceftriaxone pending urine culture ?-Continue IV fluid.  Change LR to NS for hyponatremia. ?-May need prophylactic antibiotic in the future for recurrent UTI ? ?Chronic kidney disease, stage 3a ([place]) ?Recent Labs  ?  05/27/20 ?0000 09/25/20 ?0842 02/26/21 ?1016 03/02/21 ?2003 03/02/21 ?2037 04/15/21 ?1131 04/15/21 ?1743 04/16/21 ?0314  ?BUN 22* 39* 36* 22 25* 29* 34* 28*  ?CREATININE 1.0 1.22* 1.26* 1.22* 1.10* 1.33* 1.41* 1.15*  ?-Continue IV fluid.  Change LR to NS. ?-Continue holding Maxzide ? ?Diabetes mellitus, type 2 ([place]) ?Uncontrolled with hyperglycemia and CKD-3A.  Last A1c 8.9% about a month ago. ?Recent Labs  ?Lab 04/16/21 ?0110 04/16/21 ?8563  ?GLUCAP 144* 125*  ?-Hold home Amaryl and place on SSI. ?-Start Lipitor 20 mg daily. ? ?Normocytic anemia ?Recent Labs  ?  05/27/20 ?0000 09/25/20 ?0842 02/26/21 ?1016 03/02/21 ?2003 03/02/21 ?2037 04/15/21 ?1131 04/15/21 ?1743 04/16/21 ?0314  ?HGB 14.1 13.5 13.2 13.6 14.6 13.1 12.5 11.1*  ?Slight drop in Hgb likely dilutional from IV fluid. ?-Check anemia panel in the morning ?-Continue monitoring ? ? ?Hyponatremia ?Likely from LR fluid CKD-3 ?-Change LR to NS ?-Recheck. ? ?Bandemia ?Likely due to sepsis.  Improving. ? ?Lactic acidosis ?Resolved ? ?History of TIA (transient ischemic attack) ?Recent admit for transient aphasia, presumed TIA.   ?-Continue aspirin 81 mg daily. ?-Added Lipitor 20 mg daily ? ?Hyperlipidemia associated with type 2 diabetes mellitus ([place]) ?Start Lipitor 20 mg daily. ? ?  Hypertension associated with diabetes ([place]) ?Normotensive off home Maxide and Cardura. ?-Continue holding home Maxzide and Cardura ? ? ? ? ?DVT prophylaxis:  ?enoxaparin (LOVENOX) injection 40 mg Start: 04/15/21 2215 ? ?Code Status: Full code ?Family Communication: Updated patient's husband and son at bedside. ?Level of care: Med-Surg ?Status is: Observation ?The patient will require care spanning > 2 midnights and should be moved to inpatient  because: Severe sepsis due to urinary tract infection requiring IV antibiotics pending urine culture ? ? ?Final disposition: Home ? ?Consultants:  ?None ? ?Sch Meds:  ?Scheduled Meds: ? aspirin EC  81 mg Oral Daily  ? enoxaparin (LOVENOX) injection  40 mg Subcutaneous Q24H  ? famotidine  20 mg Oral Daily  ? insulin aspart  0-5 Units Subcutaneous QHS  ? insulin aspart  0-9 Units Subcutaneous TID WC  ? ?Continuous Infusions: ? sodium chloride 100 mL/hr at 04/16/21 0844  ? cefTRIAXone (ROCEPHIN)  IV    ? ?PRN Meds:.acetaminophen **OR** acetaminophen, iohexol, ondansetron **OR** ondansetron (ZOFRAN) IV, senna-docusate ? ?Antimicrobials: ?Anti-infectives (From admission, onward)  ? ? Start     Dose/Rate Route Frequency Ordered Stop  ? 04/16/21 1800  cefTRIAXone (ROCEPHIN) 1 g in sodium chloride 0.9 % 100 mL IVPB       ? 1 g ?200 mL/hr over 30 Minutes Intravenous Every 24 hours 04/15/21 2228    ? 04/15/21 1745  cefTRIAXone (ROCEPHIN) 1 g in sodium chloride 0.9 % 100 mL IVPB       ? 1 g ?200 mL/hr over 30 Minutes Intravenous  Once 04/15/21 1741 04/15/21 1922  ? ?  ? ? ? ?I have personally reviewed the following labs and images: ?CBC: ?Recent Labs  ?Lab 04/15/21 ?1131 04/15/21 ?1743 04/16/21 ?0314  ?WBC 30.9 Repeated and verified X2.* 25.2* 17.4*  ?NEUTROABS  --  23.1*  --   ?HGB 13.1 12.5 11.1*  ?HCT 40.0 38.2 34.1*  ?MCV 80.5 81.4 82.4  ?PLT 299.0 292 222  ? ?BMP &GFR ?Recent Labs  ?Lab 04/15/21 ?1131 04/15/21 ?1743 04/16/21 ?0314  ?NA 134* 134* 133*  ?K 3.6 3.8 3.6  ?CL 98 100 102  ?CO2 _0 ?GLUCOSE 225* 203* 158*  ?BUN 29* 34* 28*  ?CREATININE 1.33* 1.41* 1.15*  ?CALCIUM 9.5 8.7* 8.2*  ? ?Estimated Creatinine Clearance: 39 mL/min (A) (by C-G formula based on SCr of 1.15 mg/dL (H)). ?Liver & Pancreas: ?Recent Labs  ?Lab 04/15/21 ?1131 04/15/21 ?1743  ?AST 20 24  ?ALT 24 27  ?ALKPHOS 100 83  ?BILITOT 0.9 0.9  ?PROT 8.0 7.9  ?ALBUMIN 3.7 3.2*  ? ?No results for input(s): LIPASE, AMYLASE in the last 168 hours. ?No  results for input(s): AMMONIA in the last 168 hours. ?Diabetic: ?No results for input(s): HGBA1C in the last 72 hours. ?Recent Labs  ?Lab 04/16/21 ?0110 04/16/21 ?1497  ?GLUCAP 144* 125*  ? ?Cardiac Enzymes: ?No results for input(s): CKTOTAL, CKMB, CKMBINDEX, TROPONINI in the last 168 hours. ?No results for input(s): PROBNP in the last 8760 hours. ?Coagulation Profile: ?No results for input(s): INR, PROTIME in the last 168 hours. ?Thyroid Function Tests: ?No results for input(s): TSH, T4TOTAL, FREET4, T3FREE, THYROIDAB in the last 72 hours. ?Lipid Profile: ?No results for input(s): CHOL, HDL, LDLCALC, TRIG, CHOLHDL, LDLDIRECT in the last 72 hours. ?Anemia Panel: ?No results for input(s): VITAMINB12, FOLATE, FERRITIN, TIBC, IRON, RETICCTPCT in the last 72 hours. ?Urine analysis: ?   ?Component Value Date/Time  ? COLORURINE YELLOW 04/15/2021 1131  ? APPEARANCEUR Cloudy (  A) 04/15/2021 1131  ? LABSPEC 1.020 04/15/2021 1131  ? LABSPEC 1.010 08/03/2016 1150  ? PHURINE 5.5 04/15/2021 1131  ? GLUCOSEU NEGATIVE 04/15/2021 1131  ? GLUCOSEU 2,000 08/03/2016 1150  ? HGBUR MODERATE (A) 04/15/2021 1131  ? [place] NEGATIVE 04/15/2021 1131  ? BILIRUBINUR negative 02/26/2021 1156  ? BILIRUBINUR Negative 08/03/2016 1150  ? [place] NEGATIVE 04/15/2021 1131  ? PROTEINUR Negative 02/26/2021 1156  ? PROTEINUR Negative 08/03/2016 1150  ? PROTEINUR NEGATIVE 12/10/2011 0900  ? UROBILINOGEN 0.2 04/15/2021 1131  ? UROBILINOGEN 0.2 08/03/2016 1150  ? NITRITE NEGATIVE 04/15/2021 1131  ? LEUKOCYTESUR SMALL (A) 04/15/2021 1131  ? LEUKOCYTESUR Moderate 08/03/2016 1150  ? ?Sepsis Labs: ?Invalid input(s): PROCALCITONIN, LACTICIDVEN ? ?Microbiology: ?Recent Results (from the past 240 hour(s))  ?Blood culture (routine x 2)     Status: None (Preliminary result)  ? Collection Time: 04/15/21  5:51 PM  ? Specimen: BLOOD  ?Result Value Ref Range Status  ? Specimen Description   Final  ?  BLOOD BLOOD LEFT HAND ?Performed at [facility], [place] [address], [place], [place] [address] ?  ? Special Requests   Final  ?  BOTTLES DRAWN AEROBIC AND ANAEROBIC Blood Culture results may not be optimal due to an excessive volume of blood received in c

## 2021-04-16 NOTE — Assessment & Plan Note (Signed)
Resolved

## 2021-04-16 NOTE — Plan of Care (Signed)
  Problem: Activity: Goal: Risk for activity intolerance will decrease Outcome: Progressing   Problem: Coping: Goal: Level of anxiety will decrease Outcome: Progressing   Problem: Elimination: Goal: Will not experience complications related to urinary retention Outcome: Progressing   

## 2021-04-17 ENCOUNTER — Other Ambulatory Visit (HOSPITAL_COMMUNITY): Payer: Self-pay

## 2021-04-17 DIAGNOSIS — R7881 Bacteremia: Secondary | ICD-10-CM | POA: Diagnosis not present

## 2021-04-17 DIAGNOSIS — E1129 Type 2 diabetes mellitus with other diabetic kidney complication: Secondary | ICD-10-CM

## 2021-04-17 DIAGNOSIS — E1165 Type 2 diabetes mellitus with hyperglycemia: Secondary | ICD-10-CM | POA: Diagnosis not present

## 2021-04-17 DIAGNOSIS — A419 Sepsis, unspecified organism: Secondary | ICD-10-CM | POA: Diagnosis not present

## 2021-04-17 DIAGNOSIS — A4151 Sepsis due to Escherichia coli [E. coli]: Principal | ICD-10-CM

## 2021-04-17 DIAGNOSIS — B9620 Unspecified Escherichia coli [E. coli] as the cause of diseases classified elsewhere: Secondary | ICD-10-CM

## 2021-04-17 DIAGNOSIS — N1831 Chronic kidney disease, stage 3a: Secondary | ICD-10-CM | POA: Diagnosis not present

## 2021-04-17 LAB — BLOOD CULTURE ID PANEL (REFLEXED) - BCID2

## 2021-04-17 LAB — URINE CULTURE: Culture: <10000 — AB

## 2021-04-17 LAB — CBC
HCT: 33.3 % — ABNORMAL LOW (ref 36.0–46.0)
Hemoglobin: 10.7 g/dL — ABNORMAL LOW (ref 12.0–15.0)
MCH: 26.9 pg (ref 26.0–34.0)
MCHC: 32.1 g/dL (ref 30.0–36.0)
MCV: 83.7 fL (ref 80.0–100.0)
Platelets: 235 10*3/uL (ref 150–400)
RBC: 3.98 MIL/uL (ref 3.87–5.11)
RDW: 14.3 % (ref 11.5–15.5)
WBC: 11 10*3/uL — ABNORMAL HIGH (ref 4.0–10.5)
nRBC: 0 % (ref 0.0–0.2)

## 2021-04-17 LAB — RETICULOCYTES
Immature Retic Fract: 11.7 % (ref 2.3–15.9)
RBC.: 4 MIL/uL (ref 3.87–5.11)
Retic Count, Absolute: 56.4 10*3/uL (ref 19.0–186.0)
Retic Ct Pct: 1.4 % (ref 0.4–3.1)

## 2021-04-17 LAB — FERRITIN: Ferritin: 671 ng/mL — ABNORMAL HIGH (ref 11–307)

## 2021-04-17 LAB — RENAL FUNCTION PANEL
Albumin: 2.6 g/dL — ABNORMAL LOW (ref 3.5–5.0)
Anion gap: 7 (ref 5–15)
BUN: 21 mg/dL (ref 8–23)
CO2: 23 mmol/L (ref 22–32)
Calcium: 8 mg/dL — ABNORMAL LOW (ref 8.9–10.3)
Chloride: 104 mmol/L (ref 98–111)
Creatinine, Ser: 0.99 mg/dL (ref 0.44–1.00)
GFR, Estimated: 60 mL/min — ABNORMAL LOW (ref >60)
Glucose, Bld: 128 mg/dL — ABNORMAL HIGH (ref 70–99)
Phosphorus: 3.1 mg/dL (ref 2.5–4.6)
Potassium: 3.7 mmol/L (ref 3.5–5.1)
Sodium: 134 mmol/L — ABNORMAL LOW (ref 135–145)

## 2021-04-17 LAB — IRON AND TIBC
Iron: 27 ug/dL — ABNORMAL LOW (ref 28–170)
Saturation Ratios: 13 % (ref 10.4–31.8)
TIBC: 206 ug/dL — ABNORMAL LOW (ref 250–450)
UIBC: 179 ug/dL

## 2021-04-17 LAB — VITAMIN B12: Vitamin B-12: 183 pg/mL (ref 180–914)

## 2021-04-17 LAB — FOLATE: Folate: 5.1 ng/mL — ABNORMAL LOW (ref >5.9)

## 2021-04-17 LAB — GLUCOSE, CAPILLARY
Glucose-Capillary: 145 mg/dL — ABNORMAL HIGH (ref 70–99)
Glucose-Capillary: 182 mg/dL — ABNORMAL HIGH (ref 70–99)

## 2021-04-17 LAB — MAGNESIUM: Magnesium: 1.4 mg/dL — ABNORMAL LOW (ref 1.7–2.4)

## 2021-04-17 MED ORDER — SENNOSIDES-DOCUSATE SODIUM 8.6-50 MG PO TABS: 1.0000 | ORAL_TABLET | Freq: Two times a day (BID) | ORAL | Status: DC | PRN

## 2021-04-17 MED ORDER — FOLIC ACID 1 MG PO TABS: 1.0000 mg | ORAL_TABLET | Freq: Every day | ORAL | 1 refills | Status: DC

## 2021-04-17 MED ORDER — CYANOCOBALAMIN 1000 MCG PO TABS: 1000.0000 ug | ORAL_TABLET | Freq: Every day | ORAL | 1 refills | Status: DC

## 2021-04-17 MED ORDER — VITAMIN B-12 1000 MCG PO TABS: 1000.0000 ug | ORAL_TABLET | Freq: Every day | ORAL | Status: DC

## 2021-04-17 MED ORDER — SODIUM CHLORIDE 0.9 % IV SOLN
2.0000 g | INTRAVENOUS | Status: DC
  Administered 2021-04-17: 2 g via INTRAVENOUS
  Filled 2021-04-17: qty 20

## 2021-04-17 MED ORDER — POLYETHYLENE GLYCOL 3350 17 G PO PACK: 17.0000 g | PACK | Freq: Two times a day (BID) | ORAL | Status: DC | PRN

## 2021-04-17 MED ORDER — FOLIC ACID 1 MG PO TABS
1.0000 mg | ORAL_TABLET | Freq: Every day | ORAL | Status: DC
  Administered 2021-04-17: 1 mg via ORAL
  Filled 2021-04-17: qty 1

## 2021-04-17 MED ORDER — CYANOCOBALAMIN 1000 MCG/ML IJ SOLN
1000.0000 ug | Freq: Once | INTRAMUSCULAR | Status: DC
  Filled 2021-04-17: qty 1

## 2021-04-17 MED ORDER — SENNOSIDES-DOCUSATE SODIUM 8.6-50 MG PO TABS
1.0000 | ORAL_TABLET | Freq: Two times a day (BID) | ORAL | Status: DC | PRN
Start: 2021-04-17 — End: 2021-04-17

## 2021-04-17 MED ORDER — CEFDINIR 300 MG PO CAPS: 300.0000 mg | ORAL_CAPSULE | Freq: Two times a day (BID) | ORAL | 0 refills | Status: DC

## 2021-04-17 MED ORDER — CEFPODOXIME PROXETIL 200 MG PO TABS: 200.0000 mg | ORAL_TABLET | Freq: Two times a day (BID) | ORAL | 0 refills | Status: DC

## 2021-04-17 MED ORDER — MAGNESIUM SULFATE 4 GM/100ML IV SOLN
4.0000 g | Freq: Once | INTRAVENOUS | Status: AC
  Administered 2021-04-17: 4 g via INTRAVENOUS
  Filled 2021-04-17: qty 100

## 2021-04-17 MED ORDER — CEFPODOXIME PROXETIL 200 MG PO TABS: 200.0000 mg | ORAL_TABLET | Freq: Two times a day (BID) | ORAL | 0 refills | Status: AC

## 2021-04-17 NOTE — Discharge Summary (Signed)
? ?Physician Discharge Summary  ?Meagan [name] WLN:[id number] DOB: 12-Jan-1946 DOA: 04/15/2021 ? ?PCP: Marin Olp, MD ? ?Admit date: 04/15/2021 ?Discharge date: 04/17/2021 ?Admitted From: Home ?Disposition: Home ?Recommendations for Outpatient Follow-up:  ?Follow ups as below. ?Please obtain CBC/BMP/Mag at follow up ?Please follow up on the following pending results: Final result of blood culture ? ?Home Health: Not indicated ?Equipment/Devices: Not indicated ? ?Discharge Condition: Stable ?CODE STATUS: Full code ? Follow-up Information   ? ? Marin Olp, MD. Schedule an appointment as soon as possible for a visit in 1 week(s).   ?Specialty: Family Medicine ?Contact information: ?[place] ?[place] [address] ?[phone] ? ? ?  ?  ? ?  ?  ? ?  ? ? ?Hospital course ?Meagan [name] is a 76 y.o. female with medical history significant for T2DM, HTN, CKD stage IIIa, HLD, GERD who is admitted with severe sepsis due to UTI.  Cultures obtained.  Started on IV ceftriaxone. ? ?Urine culture with insignificant growth.  BC ID with E. coli but no ESBL detected.  Blood culture with GNR in 1 out of 2 aerobic bottles.  Ceftriaxone increased to 2 g daily.  Patient's symptoms resolved and she felt well and ready to go home.  She is discharged on p.o. cefpodoxime 200 mg twice daily for 6 more days to complete a total of 7 days course for E. coli bacteremia per ID pharmacy recommendation. ? ?Of note, patient remained normotensive off home Maxzide and Cardura, which were discontinued on discharge.  She reports taking Maxzide for leg swelling the blood pressure.  Cardura can increase the risk of edema.  May consider as needed diuretics for leg swelling if she remains normotensive off of those medications. ?  ? ?See individual problem list below for more on hospital course. ? ?Problems addressed during this hospitalization ?Problem  ?E Coli Bacteremia  ?Severe sepsis due to E. coli bacteremia and possible  urinary tract infection  ?Chronic Kidney Disease, Stage 3a ([place])  ?Diabetes Mellitus, Type 2 ([place])  ?Urinary Tract Infection  ?Lactic Acidosis  ?Bandemia  ?Hyponatremia  ?Normocytic Anemia  ?History of Tia (Transient Ischemic Attack)  ?Hyperlipidemia Associated With Type 2 Diabetes Mellitus ([facility])  ?Hypertension associated with diabetes ([place])  ?Hypomagnesemia  ?  ?Assessment and Plan: ?* Severe sepsis due to E. coli bacteremia and possible urinary tract infection ?Met criteria for severe sepsis with leukocytosis, tachycardia, tachypnea, lactic acidosis and UTI.  Procalcitonin elevated to 8.73.  Lactic acidosis resolved.  Symptoms and leukocytosis improving.  Blood cultures NGTD.  Urine culture pending. ?-Continue empiric IV ceftriaxone pending urine culture ?-Continue IV fluid.  Change LR to NS for hyponatremia. ?-May need prophylactic antibiotic in the future for recurrent UTI ? ?E coli bacteremia ?See severe sepsis ? ?Chronic kidney disease, stage 3a ([place]) ?Recent Labs  ?  05/27/20 ?0000 09/25/20 ?0842 02/26/21 ?1016 03/02/21 ?2003 03/02/21 ?2037 04/15/21 ?1131 04/15/21 ?1743 04/16/21 ?0314 04/17/21 ?9702  ?BUN 22* 39* 36* 22 25* 29* 34* 28* 21  ?CREATININE 1.0 1.22* 1.26* 1.22* 1.10* 1.33* 1.41* 1.15* 0.99  ?Discontinued Maxzide ?-Recheck renal function at follow-up ? ?Diabetes mellitus, type 2 ([place]) ?Uncontrolled with hyperglycemia and CKD-3A.  Last A1c 8.9% about a month ago. ?Recent Labs  ?Lab 04/16/21 ?1137 04/16/21 ?1729 04/16/21 ?2040 04/17/21 ?0714 04/17/21 ?1133  ?GLUCAP 179* 132* 163* 145* 182*  ?-Continue home glimepiride. ?-Consider adding additional antihyperglycemic agent and statin. ? ?Urinary tract infection ?Patient with some urinary symptoms although urine culture  with insignificant growth.  Of note, she received p.o. cefdinir x1 dose prior to presentation. ?-Antibiotics as above ? ?Normocytic anemia ?Recent Labs  ?  05/27/20 ?0000 09/25/20 ?0842 02/26/21 ?1016 03/02/21 ?2003 03/02/21 ?2037  04/15/21 ?1131 04/15/21 ?1743 04/16/21 ?0314 04/17/21 ?8119  ?HGB 14.1 13.5 13.2 13.6 14.6 13.1 12.5 11.1* 10.7*  ?Slight drop in Hgb likely dilutional from IV fluid.  Anemia panel with borderline folic acid and J47. ?-Received B12 injection prior to discharge ?-Discharged on p.o. folic acid and W29. ? ? ?Hyponatremia ?Improved. ? ?Bandemia ?WBC down from 30-11. ? ?Lactic acidosis ?Resolved ? ?History of TIA (transient ischemic attack) ?Recent admit for transient aphasia, presumed TIA.   ?-Continue aspirin 81 mg daily. ?-Consider adding statin. ? ?Hyperlipidemia associated with type 2 diabetes mellitus ([place]) ?LDL 79 last months.  Consider statin given diabetes ? ?Hypertension associated with diabetes ([place]) ?Normotensive off home Maxide and Cardura.  Patient reports taking Maxzide for leg swelling than  hypertension.  Cardura can worsen leg swelling. ?-Discontinued home Maxzide and Cardura ? ?Hypomagnesemia ?Mg 1.4. ?-Received IV magnesium sulfate 4 g prior to discharge ? ? ?Vital signs ?Vitals:  ? 04/16/21 1415 04/16/21 1733 04/16/21 2039 04/17/21 0525  ?BP: 130/61 (!) 132/56 (!) 125/53 (!) 127/55  ?Pulse: 92 85 75 70  ?Temp: 99.6 ?F (37.6 ?C) 98.6 ?F (37 ?C) 98.4 ?F (36.9 ?C) 98.8 ?F (37.1 ?C)  ?Resp: (!) _0 ?Height:      ?Weight:      ?SpO2: 91% 96% 95% 97%  ?TempSrc: Oral Oral Oral Oral  ?BMI (Calculated):      ?  ? ?Discharge exam ? ?GENERAL: No apparent distress.  Nontoxic. ?HEENT: MMM.  Vision and hearing grossly intact.  ?NECK: Supple.  No apparent JVD.  ?RESP:  No IWOB.  Fair aeration bilaterally. ?CVS:  RRR. Heart sounds normal.  ?ABD/GI/GU: BS+. Abd soft, NTND.  ?MSK/EXT:  Moves extremities. No apparent deformity. No edema.  ?SKIN: no apparent skin lesion or wound ?NEURO: Awake and alert. Oriented appropriately.  No apparent focal neuro deficit. ?PSYCH: Calm. Normal affect.  ? ?Discharge Instructions ?Discharge Instructions   ? ? Call MD for:  difficulty breathing, headache or visual disturbances    Complete by: As directed ?  ? Call MD for:  extreme fatigue   Complete by: As directed ?  ? Call MD for:  persistant dizziness or light-headedness   Complete by: As directed ?  ? Call MD for:  persistant nausea and vomiting   Complete by: As directed ?  ? Diet - low sodium heart healthy   Complete by: As directed ?  ? Discharge instructions   Complete by: As directed ?  ? It has been a pleasure taking care of you! ? ?You were hospitalized due to blood infection by E. coli and possible urinary tract infection for which you have been treated with antibiotics.  You can complete the treatment course with cefpodoxime for the next 6 days.  Your blood pressure has been within normal range.  We have discontinued your Cardura and Maxzide.  Please review your new medication list and the directions on your medications before you take them.  Follow-up with your primary care doctor in 1 to 2 weeks or sooner if needed. ? ? ?Take care,  ? Increase activity slowly   Complete by: As directed ?  ? ?  ? ?Allergies as of 04/17/2021   ? ?   Reactions  ? Penicillin G Anaphylaxis  ?  Doxycycline Hyclate Nausea Only  ? Shrimp [shellfish Allergy] Nausea And Vomiting  ? Sinutab Sinus Max St [phenylephrine-acetaminophen] Swelling  ? Valacyclovir Other (See Comments)  ? Confusion  ? ?  ? ?  ?Medication List  ?  ? ?STOP taking these medications   ? ?cefdinir 300 MG capsule ?Commonly known as: OMNICEF ?  ?doxazosin 2 MG tablet ?Commonly known as: CARDURA ?  ?triamterene-hydrochlorothiazide 37.5-25 MG tablet ?Commonly known as: MAXZIDE-25 ?  ? ?  ? ?TAKE these medications   ? ?acetaminophen 500 MG tablet ?Commonly known as: TYLENOL ?Take 500 mg by mouth every 6 (six) hours as needed for mild pain or moderate pain. ?  ?aspirin EC 81 MG tablet ?Take 81 mg by mouth daily. Swallow whole. ?  ?cefpodoxime 200 MG tablet ?Commonly known as: VANTIN ?Take 1 tablet (200 mg total) by mouth 2 (two) times daily for 6 days. ?Start taking on: April 18, 2021 ?   ?cyanocobalamin 1000 MCG tablet ?Take 1 tablet (1,000 mcg total) by mouth daily. ?Start taking on: April 18, 2021 ?  ?famotidine 20 MG tablet ?Commonly known as: PEPCID ?Take 20 mg by mouth daily. ?  ?folic acid 1

## 2021-04-17 NOTE — Assessment & Plan Note (Signed)
-  See severe sepsis 

## 2021-04-17 NOTE — TOC Benefit Eligibility Note (Signed)
Patient Advocate Encounter ? ?Insurance verification completed.   ? ?The patient is currently admitted and upon discharge could be taking cefpodoxime (Vantin) 200 mg tablet. ? ?The current 6 day co-pay is, $72.78.  ? ?The patient is insured through [employer] Part D  ? ? ?Lyndel Safe, CPhT ?Pharmacy Patient Advocate Specialist ?[facility] Patient Advocate Team ?Direct Number: [phone]  Fax: [phone] ? ? ? ? ? ?  ?

## 2021-04-17 NOTE — Progress Notes (Signed)
PHARMACY - PHYSICIAN COMMUNICATION ?CRITICAL VALUE ALERT - BLOOD CULTURE IDENTIFICATION (BCID) ? ?Meagan [name] is an 76 y.o. female who presented to [facility] on 04/15/2021 with a chief complaint of nausea, vomiting, leukocytosis.  ? ?Assessment:  severe sepsis secondary to UTI. Blood cx 1/4 bottles with E.coli.  ? ?Name of physician (or Provider) Contacted: Dr. Cyndia Skeeters ? ?Current antibiotics: Ceftriaxone 1g IV q24h ? ?Changes to prescribed antibiotics recommended:  ?Recommendations accepted by provider ?Increase Ceftriaxone to 2g IV q24h  ? ?Results for orders placed or performed during the hospital encounter of 04/15/21  ?Blood Culture ID Panel (Reflexed) (Collected: 04/15/2021  5:53 PM)  ?Result Value Ref Range  ? Enterococcus faecalis NOT DETECTED NOT DETECTED  ? Enterococcus Faecium NOT DETECTED NOT DETECTED  ? Listeria monocytogenes NOT DETECTED NOT DETECTED  ? Staphylococcus species NOT DETECTED NOT DETECTED  ? Staphylococcus aureus (BCID) NOT DETECTED NOT DETECTED  ? Staphylococcus epidermidis NOT DETECTED NOT DETECTED  ? Staphylococcus lugdunensis NOT DETECTED NOT DETECTED  ? Streptococcus species NOT DETECTED NOT DETECTED  ? Streptococcus agalactiae NOT DETECTED NOT DETECTED  ? Streptococcus pneumoniae NOT DETECTED NOT DETECTED  ? Streptococcus pyogenes NOT DETECTED NOT DETECTED  ? A.calcoaceticus-baumannii NOT DETECTED NOT DETECTED  ? Bacteroides fragilis NOT DETECTED NOT DETECTED  ? Enterobacterales DETECTED (A) NOT DETECTED  ? Enterobacter cloacae complex NOT DETECTED NOT DETECTED  ? Escherichia coli DETECTED (A) NOT DETECTED  ? Klebsiella aerogenes NOT DETECTED NOT DETECTED  ? Klebsiella oxytoca NOT DETECTED NOT DETECTED  ? Klebsiella pneumoniae NOT DETECTED NOT DETECTED  ? Proteus species NOT DETECTED NOT DETECTED  ? Salmonella species NOT DETECTED NOT DETECTED  ? Serratia marcescens NOT DETECTED NOT DETECTED  ? Haemophilus influenzae NOT DETECTED NOT DETECTED  ? Neisseria meningitidis NOT DETECTED  NOT DETECTED  ? Pseudomonas aeruginosa NOT DETECTED NOT DETECTED  ? Stenotrophomonas maltophilia NOT DETECTED NOT DETECTED  ? Candida albicans NOT DETECTED NOT DETECTED  ? Candida auris NOT DETECTED NOT DETECTED  ? Candida glabrata NOT DETECTED NOT DETECTED  ? Candida krusei NOT DETECTED NOT DETECTED  ? Candida parapsilosis NOT DETECTED NOT DETECTED  ? Candida tropicalis NOT DETECTED NOT DETECTED  ? Cryptococcus neoformans/gattii NOT DETECTED NOT DETECTED  ? CTX-M ESBL NOT DETECTED NOT DETECTED  ? Carbapenem resistance IMP NOT DETECTED NOT DETECTED  ? Carbapenem resistance KPC NOT DETECTED NOT DETECTED  ? Carbapenem resistance NDM NOT DETECTED NOT DETECTED  ? Carbapenem resist OXA 48 LIKE NOT DETECTED NOT DETECTED  ? Carbapenem resistance VIM NOT DETECTED NOT DETECTED  ? ? ?[name] ?04/17/2021  9:49 AM ? ?

## 2021-04-17 NOTE — Plan of Care (Signed)
Pt ready to DC home with husband 

## 2021-04-17 NOTE — Assessment & Plan Note (Signed)
Mg 1.4. ?-Received IV magnesium sulfate 4 g prior to discharge ?

## 2021-04-17 NOTE — Plan of Care (Signed)
  Problem: Education: Goal: Knowledge of General Education information will improve Description: Including pain rating scale, medication(s)/side effects and non-pharmacologic comfort measures Outcome: Progressing   Problem: Nutrition: Goal: Adequate nutrition will be maintained Outcome: Progressing   Problem: Coping: Goal: Level of anxiety will decrease Outcome: Progressing   

## 2021-04-17 NOTE — Assessment & Plan Note (Signed)
Patient with some urinary symptoms although urine culture with insignificant growth.  Of note, she received p.o. cefdinir x1 dose prior to presentation. ?-Antibiotics as above ?

## 2021-04-18 LAB — URINE CULTURE
MICRO NUMBER:: 13195689
SPECIMEN QUALITY:: ADEQUATE

## 2021-04-20 ENCOUNTER — Telehealth: Payer: Self-pay

## 2021-04-20 NOTE — Telephone Encounter (Signed)
Transition Care Management Follow-up Telephone Call ?Date of discharge and from where: [facility] 04/17/21 ?How have you been since you were released from the hospital? ok ?Any questions or concerns? No ? ?Items Reviewed: ?Did the pt receive and understand the discharge instructions provided? Yes  ?Medications obtained and verified? Yes  ?Other? Yes  ?Any new allergies since your discharge? No  ?Dietary orders reviewed? Yes ?Do you have support at home? Yes  ? ?Home Care and Equipment/Supplies: ?Were home health services ordered? no ?If so, what is the name of the agency?   ?Has the agency set up a time to come to the patient's home? no ?Were any new equipment or medical supplies ordered?  No ?What is the name of the medical supply agency?  ?Were you able to get the supplies/equipment? not applicable ?Do you have any questions related to the use of the equipment or supplies? No ? ?Functional Questionnaire: (I = Independent and D = Dependent) ?ADLs: I ? ?Bathing/Dressing- I ? ?Meal Prep- I ? ?Eating- I ? ?Maintaining continence- I ? ?Transferring/Ambulation- I ? ?Managing Meds- I ? ?Follow up appointments reviewed: ? ?PCP Hospital f/u appt confirmed? Yes  Scheduled to see Dr Yong Channel  on 04/21/21 @ 4 pm. ?[facility] f/u appt confirmed? No   ?Are transportation arrangements needed? No  ?If their condition worsens, is the pt aware to call PCP or go to the Emergency Dept.? Yes ?Was the patient provided with contact information for the PCP's office or ED? Yes ?Was to pt encouraged to call back with questions or concerns? Yes ? ?

## 2021-04-20 NOTE — Progress Notes (Signed)
Please inform patient of the following: ? ?Urine culture was positive and the antibiotics she received from [place] and the hospital should treat this. She needs to follow up with Dr Yong Channel soon for hospital follow up. ? ?Algis Greenhouse. [name], MD ?04/20/2021 3:41 PM  ?

## 2021-04-21 ENCOUNTER — Ambulatory Visit (INDEPENDENT_AMBULATORY_CARE_PROVIDER_SITE_OTHER): Payer: Medicare HMO | Admitting: Family Medicine

## 2021-04-21 ENCOUNTER — Encounter: Payer: Self-pay | Admitting: Family Medicine

## 2021-04-21 VITALS — BP 132/60 | HR 91 | Temp 98.2°F | Ht 62.0 in | Wt 159.2 lb

## 2021-04-21 DIAGNOSIS — I10 Essential (primary) hypertension: Secondary | ICD-10-CM | POA: Diagnosis not present

## 2021-04-21 DIAGNOSIS — E785 Hyperlipidemia, unspecified: Secondary | ICD-10-CM

## 2021-04-21 DIAGNOSIS — Z8619 Personal history of other infectious and parasitic diseases: Secondary | ICD-10-CM

## 2021-04-21 DIAGNOSIS — R790 Abnormal level of blood mineral: Secondary | ICD-10-CM

## 2021-04-21 DIAGNOSIS — E1165 Type 2 diabetes mellitus with hyperglycemia: Secondary | ICD-10-CM

## 2021-04-21 DIAGNOSIS — E1169 Type 2 diabetes mellitus with other specified complication: Secondary | ICD-10-CM

## 2021-04-21 DIAGNOSIS — I700 Atherosclerosis of aorta: Secondary | ICD-10-CM | POA: Diagnosis not present

## 2021-04-21 LAB — CULTURE, BLOOD (ROUTINE X 2): Special Requests: ADEQUATE

## 2021-04-21 MED ORDER — ROSUVASTATIN CALCIUM 5 MG PO TABS: 5.0000 mg | ORAL_TABLET | ORAL | 3 refills | Status: DC

## 2021-04-21 NOTE — Assessment & Plan Note (Signed)
#  Hypertension-patient doing well off Maxide (patient reports mainly for edema started years ago) and Cardura at this time-could certainly consider restarting in the future if blood pressure trends up-controlled today ?

## 2021-04-21 NOTE — Patient Instructions (Addendum)
Start rosuvastatin 5 mg once a week ? ?Please stop by lab before you go ?If you have mychart- we will send your results within 3 business days of [place] receiving them.  ?If you do not have mychart- we will call you about results within 5 business days of [place] receiving them.  ?*please also note that you will see labs on mychart as soon as they post. I will later go in and write notes on them- will say "notes from Dr. Yong Channel"  ? ?Recommended follow up: Return for next already scheduled visit or sooner if needed. ?

## 2021-04-21 NOTE — Progress Notes (Signed)
**Note De-Identified Meagan Obfuscation** ?Phone [phone] ?  ?Subjective:  ?Meagan [name] is a 76 y.o. year old very pleasant female patient who presents for transitional care management and hospital follow up for sepsis with bacteremia ldue to UTI. Patient was hospitalized from 04/15/2021 to 04/17/2021. A TCM phone call was completed on 04/20/2021. Medical complexity moderate ? ?Hospital discharge summary was reviewed and summarized as below ?Patient was admitted due to severe sepsis from UTI.  she reports today initially thought that it was food poisoning- nausea and comviting, shaking but symptoms did not resolve so sought careUrine cultures and blood cultures were drawn.  Patient was started on IV ceftriaxone.  Urine culture with insignificant growth in the hospital but prior to hospitalization outpatient culture was performed of the urine and did show pansensitive E. coli-also blood culture identified with E. coli but thankfully no ESBL and pansensitive-this grew and only 1 of 2 aerobic bottles.  Ceftriaxone was increased to 2 g daily.  Her symptoms improved while hospitalized.  She was able to be discharged on cefpodoxime 200 mg twice daily for 6 more days to complete a total of 7 days for E. coli bacteremia-infectious disease was consulted per discharge summary but I cannot locate a consult note today.  She did have an initial lactic acidosis which resolved with treatment ? ?In regards to patient's chronic conditions her Maxide and Cardura were held.  She primarily uses Maxide for leg swelling and they were concerned Cardura could increase risk of edema.  Plan was for as needed diuretics. She has gained 10 lbs in a week and feels more swelling- but comparing last visit to today 4 lbs. Swelling causes some daytime discomfort but goes down overnight.  ? ?Patient did have slight elevation in creatinine up to 1.4 but trended back down to 0.99 prior to discharge ? ?Patient was continued on glimepiride at discharge-prior diabetes been poorly controlled  with A1c up to 8.9 but she has declined additional agent in the past ? ?Patient also noted to have normocytic anemia.  Did have some worsening while hospitalized thought dilutional-treated with B12 injection prior to discharge and recommended home D17 and folic acid ? ?Leukocytosis White blood count initially elevated near 30,000 but trended down to 11,000 before discharge ? ?For history of TIA she was continued on aspirin and statin was recommended ?For hyperlipidemia-statin recommended but patient declined  ? ?Did have low magnesium level but this was repleted-no reason to suspect ongoing issues with her stopping diuretic ? ?Today, reports no ongoing nausea, vomiting, chills. She is completing her course of antibiotic.  ? ? See problem oriented charting as well ? ?Past Medical History-  ?Patient Active Problem List  ? Diagnosis Date Noted  ? E coli bacteremia 04/17/2021  ?  Priority: High  ? History of TIA (transient ischemic attack) 04/15/2021  ?  Priority: High  ? Diabetes mellitus, type 2 ([place]) 02/12/2015  ?  Priority: High  ? Normocytic anemia 04/16/2021  ?  Priority: Medium   ? Chronic kidney disease, stage 3a ([place]) 04/15/2021  ?  Priority: Medium   ? Acid reflux 02/12/2015  ?  Priority: Medium   ? Hyperlipidemia associated with type 2 diabetes mellitus ([place]) 02/12/2015  ?  Priority: Medium   ? Primary hypertension 12/08/2011  ?  Priority: Medium   ? GERD (gastroesophageal reflux disease) 03/03/2021  ?  Priority: Low  ? Dermatitis, eczematoid 02/12/2015  ?  Priority: Low  ? History of endometrial cancer 12/08/2011  ?  Priority: Low  ? ? ?  Medications- reviewed and updated  ?A medical reconciliation was performed comparing current medicines to hospital discharge medications. ?Current Outpatient Medications  ?Medication Sig Dispense Refill  ? acetaminophen (TYLENOL) 500 MG tablet Take 500 mg by mouth every 6 (six) hours as needed for mild pain or moderate pain.    ? aspirin EC 81 MG tablet Take 81 mg by mouth  daily. Swallow whole.    ? cefpodoxime (VANTIN) 200 MG tablet Take 1 tablet (200 mg total) by mouth 2 (two) times daily for 6 days. 12 tablet 0  ? famotidine (PEPCID) 20 MG tablet Take 20 mg by mouth daily.    ? folic acid (FOLVITE) 1 MG tablet Take 1 tablet (1 mg total) by mouth daily. 90 tablet 1  ? glimepiride (AMARYL) 4 MG tablet Take 1 tablet (4 mg total) by mouth 2 (two) times daily. 180 tablet 3  ? mometasone (ELOCON) 0.1 % lotion Apply 1 application topically daily.    ? rosuvastatin (CRESTOR) 5 MG tablet Take 1 tablet (5 mg total) by mouth once a week. 13 tablet 3  ? senna-docusate (SENOKOT-S) 8.6-50 MG tablet Take 1 tablet by mouth 2 (two) times daily as needed for moderate constipation or mild constipation.    ? vitamin B-12 1000 MCG tablet Take 1 tablet (1,000 mcg total) by mouth daily. 90 tablet 1  ? ?No current facility-administered medications for this visit.  ? ?Objective  ?Objective:  ?BP 132/60   Pulse 91   Temp 98.2 ?F (36.8 ?C)   Ht '5\' 2"'$  (1.575 m)   Wt 159 lb 3.2 oz (72.2 kg)   LMP 01/18/2001   SpO2 95%   BMI 29.12 kg/m?  ?Gen: NAD, resting comfortably ?CV: RRR no murmurs rubs or gallops ?Lungs: CTAB no crackles, wheeze, rhonchi ? ?Ext: Trace edema left greater than right leg ?Skin: warm, dry ? ?  ?Assessment and Plan:  ? ?#Planned TCM but no ongoing needs-no home health care, etc. Will change to standard hospital follow-up ? ?#Sepsis with bacteremia from E. coli related UTI-patient will complete course of cefpodoxime 6 days outpatient and appear to be treated 3 days and inpatient-appears to be improving-we discussed appropriate follow-up ?-Prior leukocytosis but was trending down-update CBC with labs today ? ?#Poorly controlled diabetes-we discussed with poor control makes infection more likely-she would still like to only continue glimepiride at this time and focus on healthy eating/regular exercise-she has seen blood sugars trending down more into the 120s with some changes at  home ? ?#Anemia-possibly dilutional but also had low normal B28 and folic acid-she is taking these consistently now ? ?#Aortic atherosclerosis/coronary artery calcifications/hyperlipidemia-patient had not previously been interested in statin but she is willing to consider rosuvastatin 5 mg once a week and see how she tolerates this.  With poor control of lipids and LDL over 70 I think this is the right move-hopefully can titrate up later ? ?#Edema-likely venous insufficiency-has good pulses and think she can tolerate compression stockings that she can use Maxide sparingly ? ?#Hypomagnesemia-update magnesium with labs today  ? ?#Hypertension-patient doing well off Maxide and Cardura at this time-could certainly consider restarting in the future if blood pressure trends up-controlled today ? ?Recommended follow up: Return for next already scheduled visit or sooner if needed. ?Future Appointments  ?Date Time Provider [place]  ?06/18/2021  2:00 PM Haidy Kackley, Brayton Mars, MD [facility]  ? ? ?Lab/Order associations: ?  ICD-10-CM   ?1. History of sepsis  Z86.19   ?  ?2. Low  magnesium level  R79.0 Magnesium  ?  ?3. Primary hypertension  I10 CBC with Differential/Platelet  ?  Comprehensive metabolic panel  ?  ?4. Type 2 diabetes mellitus with hyperglycemia, without long-term current use of insulin (HCC)  E11.65   ?  ?5. Hyperlipidemia associated with type 2 diabetes mellitus ([place])  E11.69   ? E78.5   ?  ? ? ?Meds ordered this encounter  ?Medications  ? rosuvastatin (CRESTOR) 5 MG tablet  ?  Sig: Take 1 tablet (5 mg total) by mouth once a week.  ?  Dispense:  13 tablet  ?  Refill:  3  ? ? Time Spent: ?42 minutes of total time (4:30 PM-5:05 PM, 9:10 PM-9:17 PM) was spent on the date of the encounter performing the following actions: chart review prior to seeing the patient, obtaining history, performing a medically necessary exam, counseling on the treatment plan, placing orders, and documenting in our EHR.   ?Return  precautions advised.  ?Garret Reddish, MD ? ? ?

## 2021-04-22 LAB — CBC WITH DIFFERENTIAL/PLATELET
Basophils Absolute: 0.1 10*3/uL (ref 0.0–0.1)
Basophils Relative: 1.1 % (ref 0.0–3.0)
Eosinophils Absolute: 0.4 10*3/uL (ref 0.0–0.7)
Eosinophils Relative: 3.1 % (ref 0.0–5.0)
HCT: 35.1 % — ABNORMAL LOW (ref 36.0–46.0)
Hemoglobin: 11.6 g/dL — ABNORMAL LOW (ref 12.0–15.0)
Lymphocytes Relative: 17.8 % (ref 12.0–46.0)
Lymphs Abs: 2.4 10*3/uL (ref 0.7–4.0)
MCHC: 33 g/dL (ref 30.0–36.0)
MCV: 81.2 fl (ref 78.0–100.0)
Monocytes Absolute: 0.8 10*3/uL (ref 0.1–1.0)
Monocytes Relative: 5.8 % (ref 3.0–12.0)
Neutro Abs: 9.5 10*3/uL — ABNORMAL HIGH (ref 1.4–7.7)
Neutrophils Relative %: 72.2 % (ref 43.0–77.0)
Platelets: 376 10*3/uL (ref 150.0–400.0)
RBC: 4.32 Mil/uL (ref 3.87–5.11)
RDW: 14.2 % (ref 11.5–15.5)
WBC: 13.2 10*3/uL — ABNORMAL HIGH (ref 4.0–10.5)

## 2021-04-22 LAB — COMPREHENSIVE METABOLIC PANEL
ALT: 26 U/L (ref 0–35)
AST: 26 U/L (ref 0–37)
Albumin: 3.3 g/dL — ABNORMAL LOW (ref 3.5–5.2)
Alkaline Phosphatase: 96 U/L (ref 39–117)
BUN: 21 mg/dL (ref 6–23)
CO2: 25 mEq/L (ref 19–32)
Calcium: 8.6 mg/dL (ref 8.4–10.5)
Chloride: 102 mEq/L (ref 96–112)
Creatinine, Ser: 0.94 mg/dL (ref 0.40–1.20)
GFR: 59.78 mL/min — ABNORMAL LOW (ref >60.00)
Glucose, Bld: 139 mg/dL — ABNORMAL HIGH (ref 70–99)
Potassium: 4.1 mEq/L (ref 3.5–5.1)
Sodium: 136 mEq/L (ref 135–145)
Total Bilirubin: 0.3 mg/dL (ref 0.2–1.2)
Total Protein: 7.2 g/dL (ref 6.0–8.3)

## 2021-04-22 LAB — MAGNESIUM: Magnesium: 1.5 mg/dL (ref 1.5–2.5)

## 2021-04-28 ENCOUNTER — Encounter: Payer: Self-pay | Admitting: Family Medicine

## 2021-04-29 NOTE — Telephone Encounter (Signed)
See below

## 2021-04-29 NOTE — Telephone Encounter (Signed)
If she is feeling better then this can wait until Monday. I will be out of the office for the rest of the week as well so I would not be the best option to follow up on any labs that she will be getting urgently. ? ?Algis Greenhouse. [name], MD ?04/29/2021 10:47 AM  ? ?

## 2021-05-01 ENCOUNTER — Encounter: Payer: Self-pay | Admitting: Family Medicine

## 2021-05-01 ENCOUNTER — Ambulatory Visit (INDEPENDENT_AMBULATORY_CARE_PROVIDER_SITE_OTHER): Payer: Medicare HMO | Admitting: Family Medicine

## 2021-05-01 VITALS — BP 143/63 | HR 82 | Temp 98.0°F | Ht 62.0 in | Wt 168.4 lb

## 2021-05-01 DIAGNOSIS — Z8619 Personal history of other infectious and parasitic diseases: Secondary | ICD-10-CM

## 2021-05-01 DIAGNOSIS — D72829 Elevated white blood cell count, unspecified: Secondary | ICD-10-CM | POA: Diagnosis not present

## 2021-05-01 LAB — URINALYSIS, ROUTINE W REFLEX MICROSCOPIC
Bilirubin Urine: NEGATIVE
Ketones, ur: NEGATIVE
Leukocytes,Ua: NEGATIVE
Nitrite: NEGATIVE
Specific Gravity, Urine: 1.015 (ref 1.000–1.030)
Total Protein, Urine: 30 — AB
Urine Glucose: 500 — AB
Urobilinogen, UA: 0.2 (ref 0.0–1.0)
pH: 5.5 (ref 5.0–8.0)

## 2021-05-01 LAB — COMPREHENSIVE METABOLIC PANEL
ALT: 14 U/L (ref 0–35)
AST: 14 U/L (ref 0–37)
Albumin: 3.5 g/dL (ref 3.5–5.2)
Alkaline Phosphatase: 100 U/L (ref 39–117)
BUN: 20 mg/dL (ref 6–23)
CO2: 26 mEq/L (ref 19–32)
Calcium: 9.2 mg/dL (ref 8.4–10.5)
Chloride: 100 mEq/L (ref 96–112)
Creatinine, Ser: 0.8 mg/dL (ref 0.40–1.20)
GFR: 72.53 mL/min (ref >60.00)
Glucose, Bld: 209 mg/dL — ABNORMAL HIGH (ref 70–99)
Potassium: 3.9 mEq/L (ref 3.5–5.1)
Sodium: 133 mEq/L — ABNORMAL LOW (ref 135–145)
Total Bilirubin: 0.5 mg/dL (ref 0.2–1.2)
Total Protein: 7.5 g/dL (ref 6.0–8.3)

## 2021-05-01 LAB — CBC WITH DIFFERENTIAL/PLATELET
Basophils Absolute: 0.1 10*3/uL (ref 0.0–0.1)
Basophils Relative: 1.4 % (ref 0.0–3.0)
Eosinophils Absolute: 0.3 10*3/uL (ref 0.0–0.7)
Eosinophils Relative: 4.3 % (ref 0.0–5.0)
HCT: 38.3 % (ref 36.0–46.0)
Hemoglobin: 12.6 g/dL (ref 12.0–15.0)
Lymphocytes Relative: 28.6 % (ref 12.0–46.0)
Lymphs Abs: 2.2 10*3/uL (ref 0.7–4.0)
MCHC: 32.8 g/dL (ref 30.0–36.0)
MCV: 81 fl (ref 78.0–100.0)
Monocytes Absolute: 0.6 10*3/uL (ref 0.1–1.0)
Monocytes Relative: 7.4 % (ref 3.0–12.0)
Neutro Abs: 4.5 10*3/uL (ref 1.4–7.7)
Neutrophils Relative %: 58.3 % (ref 43.0–77.0)
Platelets: 302 10*3/uL (ref 150.0–400.0)
RBC: 4.73 Mil/uL (ref 3.87–5.11)
RDW: 14.8 % (ref 11.5–15.5)
WBC: 7.7 10*3/uL (ref 4.0–10.5)

## 2021-05-01 NOTE — Progress Notes (Signed)
? ?Subjective:  ? ? ? Patient ID: Meagan [name], female    DOB: 1945/11/23, 76 y.o.   MRN: [id number] ? ?Chief Complaint  ?Patient presents with  ? Follow-up  ?  Pt f/u for recent UTI; pt states feeling better, but worried due to being in hospital for Sepsis wanting to make sure her levels are back to normal  ? ? ?HPI-here w/husb ?Admitted in March 29 for urosepsis.  Was on IV abx for 2 days-sent home on abx.  Finished all.  Saw Dr. Yong Channel on 4/4 and had labs(was on abx).   Sis died w/sepsis so pt concerned ?All done w/abx and wants to be sure ok. No urinary complaints. No f/c/n/v/d.    ?Taking B complex vits. ? ?Health Maintenance Due  ?Topic Date Due  ? DEXA SCAN  Never done  ? ? ?Past Medical History:  ?Diagnosis Date  ? Anemia   ? hx   ? Anxiety   ? no meds  ? Arthritis   ? knees  ? Cancer [facility])   ? endometrial cancer  ? Diabetes mellitus without complication ([place])   ? on oral medication  ? GERD (gastroesophageal reflux disease)   ? pepcid now/famotidine  ? Hypertension   ? SVD (spontaneous vaginal delivery)   ? x 1  ? UTI (lower urinary tract infection) 11/22/2011  ? started abx on 11/24/11  ? ? ?Past Surgical History:  ?Procedure Laterality Date  ? CHOLECYSTECTOMY    ? DILATATION & CURRETTAGE/HYSTEROSCOPY WITH RESECTOCOPE  11/30/2011  ? Procedure: [place];  Surgeon: Peri Maris, MD;  Location: [facility];  Service: Gynecology;  Laterality: N/A;  ? DILATION AND CURETTAGE OF UTERUS    ? LYMPH NODE DISSECTION  12/14/2011  ? Procedure: LYMPH NODE DISSECTION;  Surgeon: Imagene Gurney A. Alycia Rossetti, MD;  Location: [facility];  Service: Gynecology;  Laterality: N/A;  ? ROBOTIC ASSISTED TOTAL HYSTERECTOMY WITH BILATERAL SALPINGO OOPHERECTOMY  12/14/2011  ? Procedure: ROBOTIC ASSISTED TOTAL HYSTERECTOMY WITH BILATERAL SALPINGO OOPHORECTOMY;  Surgeon: Imagene Gurney A. Alycia Rossetti, MD;  Location: [facility];  Service: Gynecology;  Laterality: N/A;  ? WISDOM TOOTH EXTRACTION    ? ? ?Outpatient Medications  Prior to Visit  ?Medication Sig Dispense Refill  ? acetaminophen (TYLENOL) 500 MG tablet Take 500 mg by mouth every 6 (six) hours as needed for mild pain or moderate pain.    ? aspirin EC 81 MG tablet Take 81 mg by mouth daily. Swallow whole.    ? famotidine (PEPCID) 20 MG tablet Take 20 mg by mouth daily.    ? folic acid (FOLVITE) 1 MG tablet Take 1 tablet (1 mg total) by mouth daily. 90 tablet 1  ? glimepiride (AMARYL) 4 MG tablet Take 1 tablet (4 mg total) by mouth 2 (two) times daily. 180 tablet 3  ? mometasone (ELOCON) 0.1 % lotion Apply 1 application topically daily.    ? rosuvastatin (CRESTOR) 5 MG tablet Take 1 tablet (5 mg total) by mouth once a week. 13 tablet 3  ? senna-docusate (SENOKOT-S) 8.6-50 MG tablet Take 1 tablet by mouth 2 (two) times daily as needed for moderate constipation or mild constipation.    ? vitamin B-12 1000 MCG tablet Take 1 tablet (1,000 mcg total) by mouth daily. 90 tablet 1  ? ?No facility-administered medications prior to visit.  ? ? ?Allergies  ?Allergen Reactions  ? Penicillin G Anaphylaxis  ? Doxycycline Hyclate Nausea Only  ? Shrimp [Shellfish Allergy] Nausea And Vomiting  ?  Sinutab Sinus Max St [Phenylephrine-Acetaminophen] Swelling  ? Valacyclovir Other (See Comments)  ?  Confusion  ? ?ROS neg/noncontributory except as noted HPI/below ?Does get few UTI's, but this hosp, no symptoms ? ?   ?Objective:  ?  ? ?BP (!) 143/63 (BP Location: Left Arm)   Pulse 82   Temp 98 ?F (36.7 ?C) (Temporal)   Ht '5\' 2"'$  (1.575 m)   Wt 168 lb 6.4 oz (76.4 kg)   LMP 01/18/2001   SpO2 96%   BMI 30.80 kg/m?  ?Wt Readings from Last 3 Encounters:  ?05/01/21 168 lb 6.4 oz (76.4 kg)  ?04/21/21 159 lb 3.2 oz (72.2 kg)  ?04/15/21 152 lb (68.9 kg)  ? ? ?Physical Exam  ? ?Gen: [facility]N NAD ?HEENT: NCAT, conjunctiva not injected, sclera nonicteric ?NECK:  supple, no thyromegaly, no nodes, no carotid bruits ?CARDIAC: RRR, S1S2+, no murmur. LUNGS: CTAB. No wheezes ?ABDOMEN:  BS+, soft, NTND, No HSM, no  masses ?EXT:  no edema ?MSK: no gross abnormalities.  ?NEURO: A&O x3.  CN II-XII intact.  ?PSYCH: normal mood. Good eye contact ? ?   ?Assessment & Plan:  ? ?Problem List Items Addressed This Visit   ?None ?Visit Diagnoses   ? ? Leukocytosis, unspecified type    -  Primary  ? Relevant Orders  ? CBC with Differential/Platelet  ? History of sepsis      ? Relevant Orders  ? CBC with Differential/Platelet  ? Comprehensive metabolic panel  ? Urinalysis, Routine w reflex microscopic  ? ?  ? Leukocytosis-reviewed labs-baseline 10-13 since at least 2013.  Reck ?H/o sepsis-pt very worried/concerned about abn labs.  Will check cbc,cmp,ua.   Totally aysmptomatic ? ?No orders of the defined types were placed in this encounter. ? ? ?Wellington Hampshire, MD ? ?

## 2021-05-01 NOTE — Patient Instructions (Signed)
It was very nice to see you today! ? ?White count usually >10.  ? ? ?PLEASE NOTE: ? ?If you had any lab tests please let us know if you have not heard back within a few days. You may see your results on MyChart before we have a chance to review them but we will give you a call once they are reviewed by [place]. If we ordered any referrals today, please let us know if you have not heard from their office within the next week.  ? ?Please try these tips to maintain a healthy lifestyle: ? ?Eat most of your calories during the day when you are active. Eliminate processed foods including packaged sweets (pies, cakes, cookies), reduce intake of potatoes, white bread, white pasta, and white rice. Look for whole grain options, oat flour or almond flour. ? ?Each meal should contain half fruits/vegetables, one quarter protein, and one quarter carbs (no bigger than a computer mouse). ? ?Cut down on sweet beverages. This includes juice, soda, and sweet tea. Also watch fruit intake, though this is a healthier sweet option, it still contains natural sugar! Limit to 3 servings daily. ? ?Drink at least 1 glass of water with each meal and aim for at least 8 glasses per day ? ?Exercise at least 150 minutes every week.   ?

## 2021-05-13 ENCOUNTER — Telehealth: Payer: Self-pay | Admitting: Family Medicine

## 2021-05-13 NOTE — Telephone Encounter (Signed)
Copied from [place]. Topic: Medicare AWV ?>> May 13, 2021  2:28 PM Harris-Coley, Hannah Beat wrote: ?Reason for CRM: Left message for patient to schedule Annual Wellness Visit.  Please schedule with Nurse Health Advisor Charlott Rakes, RN at [facility]  Please call [phone] ask for Juliann Pulse ?

## 2021-05-29 ENCOUNTER — Encounter: Payer: Self-pay | Admitting: Family

## 2021-05-29 ENCOUNTER — Ambulatory Visit (INDEPENDENT_AMBULATORY_CARE_PROVIDER_SITE_OTHER): Payer: Medicare HMO | Admitting: Family

## 2021-05-29 VITALS — BP 148/78 | HR 90 | Temp 97.6°F | Ht 62.0 in | Wt 165.5 lb

## 2021-05-29 DIAGNOSIS — R102 Pelvic and perineal pain: Secondary | ICD-10-CM | POA: Diagnosis not present

## 2021-05-29 LAB — POCT URINALYSIS DIPSTICK
Bilirubin, UA: NEGATIVE
Blood, UA: POSITIVE
Glucose, UA: NEGATIVE
Ketones, UA: NEGATIVE
Leukocytes, UA: NEGATIVE
Nitrite, UA: NEGATIVE
Protein, UA: POSITIVE — AB
Spec Grav, UA: 1.015 (ref 1.010–1.025)
Urobilinogen, UA: 0.2 E.U./dL
pH, UA: 6 (ref 5.0–8.0)

## 2021-05-29 NOTE — Patient Instructions (Addendum)
It was very nice to see you today! ? ?Your urine is negative for infection today. ?Your pain may be related to some constipation.  ?OK to take '400mg'$  Magnesium oxide, glycinate, or L-threonate. This is beneficial for regular bowels, sleep maintenance, blood pressure, and muscle recovery or muscle carmping. ? ?Be sure to drink at least 2 liters of water daily! ? ? ?PLEASE NOTE: ? ?If you had any lab tests please let us know if you have not heard back within a few days. You may see your results on MyChart before we have a chance to review them but we will give you a call once they are reviewed by [place]. If we ordered any referrals today, please let us know if you have not heard from their office within the next week.  ? ?Please try these tips to maintain a healthy lifestyle: ? ?Eat most of your calories during the day when you are active. Eliminate processed foods including packaged sweets (pies, cakes, cookies), reduce intake of potatoes, white bread, white pasta, and white rice. Look for whole grain options, oat flour or almond flour. ? ?Each meal should contain half fruits/vegetables, one quarter protein, and one quarter carbs (no bigger than a computer mouse). ? ?Cut down on sweet beverages. This includes juice, soda, and sweet tea. Also watch fruit intake, though this is a healthier sweet option, it still contains natural sugar! Limit to 3 servings daily. ? ?Drink at least 1 glass of water with each meal and aim for at least 8 glasses per day ? ?Exercise at least 150 minutes every week.  ? ?

## 2021-05-29 NOTE — Progress Notes (Signed)
? ?Subjective:  ? ? ? Patient ID: Meagan [name], female    DOB: [birth year], 76 y.o.   MRN: [id number] ? ?Chief Complaint  ?Patient presents with  ? Urinary Frequency  ?  Pt c/o urinary frequency , stomach/back pain. Has tried tylenol and has been a few days.   ? ? ?HPI: ?Urinary symptoms: Patient c/o  frequency, urgency,pelvic pain and LLQ pain. Other sx: no vaginal d/c, or vaginal itching. Duration of sx: 3 days; Home tx: none; Denies  nausea, fever. Reports last UTI 2 mos ago and ended up with sepsis in the hospital, so pt very concerned.  ? ?Assessment & Plan:  ? ?Problem List Items Addressed This Visit   ?None ?Visit Diagnoses   ? ? Pelvic pain    -  Primary  ? UA neg today. Advised pt on proper water hydration daily. She also reports LLQ pain and states she has not had a BM in a few days, but this is normal for her. Advised pt she should try for daily BM, has recently started a magnesium supplement for muscle weakness, advised this can help bowels as well, increase fiber in diet (raisin bran works in past), also can add Benefiber or Metamucil daily. ? ?Relevant Orders  ? POCT Urinalysis Dipstick (Completed)  ? ?  ? ? ?Outpatient Medications Prior to Visit  ?Medication Sig Dispense Refill  ? acetaminophen (TYLENOL) 500 MG tablet Take 500 mg by mouth every 6 (six) hours as needed for mild pain or moderate pain.    ? aspirin EC 81 MG tablet Take 81 mg by mouth daily. Swallow whole.    ? famotidine (PEPCID) 20 MG tablet Take 20 mg by mouth daily.    ? folic acid (FOLVITE) 1 MG tablet Take 1 tablet (1 mg total) by mouth daily. 90 tablet 1  ? glimepiride (AMARYL) 4 MG tablet Take 1 tablet (4 mg total) by mouth 2 (two) times daily. 180 tablet 3  ? mometasone (ELOCON) 0.1 % lotion Apply 1 application topically daily.    ? rosuvastatin (CRESTOR) 5 MG tablet Take 1 tablet (5 mg total) by mouth once a week. 13 tablet 3  ? senna-docusate (SENOKOT-S) 8.6-50 MG tablet Take 1 tablet by mouth 2 (two) times daily as needed  for moderate constipation or mild constipation.    ? vitamin B-12 1000 MCG tablet Take 1 tablet (1,000 mcg total) by mouth daily. 90 tablet 1  ? ?No facility-administered medications prior to visit.  ? ? ?Past Medical History:  ?Diagnosis Date  ? Anemia   ? hx   ? Anxiety   ? no meds  ? Arthritis   ? knees  ? Cancer [facility])   ? endometrial cancer  ? Diabetes mellitus without complication ([place])   ? on oral medication  ? GERD (gastroesophageal reflux disease)   ? pepcid now/famotidine  ? Hypertension   ? SVD (spontaneous vaginal delivery)   ? x 1  ? UTI (lower urinary tract infection) 11/22/2011  ? started abx on 11/24/11  ? ? ?Past Surgical History:  ?Procedure Laterality Date  ? CHOLECYSTECTOMY    ? DILATATION & CURRETTAGE/HYSTEROSCOPY WITH RESECTOCOPE  11/30/2011  ? Procedure: [place];  Surgeon: Peri Maris, MD;  Location: [facility];  Service: Gynecology;  Laterality: N/A;  ? DILATION AND CURETTAGE OF UTERUS    ? LYMPH NODE DISSECTION  12/14/2011  ? Procedure: LYMPH NODE DISSECTION;  Surgeon: Imagene Gurney A. Alycia Rossetti, MD;  Location: [facility];  Service: Gynecology;  Laterality: N/A;  ? ROBOTIC ASSISTED TOTAL HYSTERECTOMY WITH BILATERAL SALPINGO OOPHERECTOMY  12/14/2011  ? Procedure: ROBOTIC ASSISTED TOTAL HYSTERECTOMY WITH BILATERAL SALPINGO OOPHORECTOMY;  Surgeon: Imagene Gurney A. Alycia Rossetti, MD;  Location: [facility];  Service: Gynecology;  Laterality: N/A;  ? WISDOM TOOTH EXTRACTION    ? ? ?Allergies  ?Allergen Reactions  ? Penicillin G Anaphylaxis  ? Doxycycline Hyclate Nausea Only  ? Shrimp [Shellfish Allergy] Nausea And Vomiting  ? Sinutab Sinus Max St [Phenylephrine-Acetaminophen] Swelling  ? Valacyclovir Other (See Comments)  ?  Confusion  ? ? ?   ?Objective:  ?  ?Physical Exam ?Vitals and nursing note reviewed.  ?Constitutional:   ?   Appearance: Normal appearance.  ?Cardiovascular:  ?   Rate and Rhythm: Normal rate and regular rhythm.  ?Pulmonary:  ?   Effort: Pulmonary effort is normal.   ?   Breath sounds: Normal breath sounds.  ?Musculoskeletal:     ?   General: Normal range of motion.  ?Skin: ?   General: Skin is warm and dry.  ?Neurological:  ?   Mental Status: She is alert.  ?Psychiatric:     ?   Mood and Affect: Mood normal.     ?   Behavior: Behavior normal.  ? ? ?BP (!) 148/78 (BP Location: Left Arm, Patient Position: Sitting, Cuff Size: Large)   Pulse 90   Temp 97.6 ?F (36.4 ?C) (Temporal)   Ht '5\' 2"'$  (1.575 m)   Wt 165 lb 8 oz (75.1 kg)   LMP 01/18/2001   SpO2 97%   BMI 30.27 kg/m?  ?Wt Readings from Last 3 Encounters:  ?05/29/21 165 lb 8 oz (75.1 kg)  ?05/01/21 168 lb 6.4 oz (76.4 kg)  ?04/21/21 159 lb 3.2 oz (72.2 kg)  ?   ? ?No orders of the defined types were placed in this encounter. ? ? ?Jeanie Sewer, NP ? ?

## 2021-06-15 ENCOUNTER — Encounter (HOSPITAL_COMMUNITY): Payer: Self-pay

## 2021-06-15 ENCOUNTER — Emergency Department (HOSPITAL_COMMUNITY): Payer: Medicare HMO

## 2021-06-15 ENCOUNTER — Emergency Department (HOSPITAL_COMMUNITY)
Admission: EM | Admit: 2021-06-15 | Discharge: 2021-06-15 | Disposition: A | Discharge status: Home / Self Care | Payer: Medicare HMO | Attending: Emergency Medicine | Admitting: Emergency Medicine

## 2021-06-15 ENCOUNTER — Other Ambulatory Visit: Payer: Self-pay

## 2021-06-15 DIAGNOSIS — I10 Essential (primary) hypertension: Secondary | ICD-10-CM | POA: Diagnosis not present

## 2021-06-15 DIAGNOSIS — R4701 Aphasia: Secondary | ICD-10-CM | POA: Insufficient documentation

## 2021-06-15 DIAGNOSIS — G459 Transient cerebral ischemic attack, unspecified: Secondary | ICD-10-CM | POA: Diagnosis not present

## 2021-06-15 DIAGNOSIS — Z7982 Long term (current) use of aspirin: Secondary | ICD-10-CM | POA: Insufficient documentation

## 2021-06-15 DIAGNOSIS — R519 Headache, unspecified: Secondary | ICD-10-CM | POA: Diagnosis not present

## 2021-06-15 DIAGNOSIS — Z79899 Other long term (current) drug therapy: Secondary | ICD-10-CM | POA: Diagnosis not present

## 2021-06-15 DIAGNOSIS — E119 Type 2 diabetes mellitus without complications: Secondary | ICD-10-CM | POA: Diagnosis not present

## 2021-06-15 DIAGNOSIS — Z7984 Long term (current) use of oral hypoglycemic drugs: Secondary | ICD-10-CM | POA: Insufficient documentation

## 2021-06-15 DIAGNOSIS — I639 Cerebral infarction, unspecified: Secondary | ICD-10-CM | POA: Insufficient documentation

## 2021-06-15 DIAGNOSIS — Z859 Personal history of malignant neoplasm, unspecified: Secondary | ICD-10-CM | POA: Insufficient documentation

## 2021-06-15 DIAGNOSIS — R7989 Other specified abnormal findings of blood chemistry: Secondary | ICD-10-CM | POA: Insufficient documentation

## 2021-06-15 LAB — URINALYSIS, ROUTINE W REFLEX MICROSCOPIC
Bilirubin Urine: NEGATIVE
Glucose, UA: >=500 mg/dL — AB
Ketones, ur: NEGATIVE mg/dL
Nitrite: NEGATIVE
Protein, ur: 100 mg/dL — AB
Specific Gravity, Urine: 1.006 (ref 1.005–1.030)
pH: 5 (ref 5.0–8.0)

## 2021-06-15 LAB — COMPREHENSIVE METABOLIC PANEL
ALT: 21 U/L (ref 0–44)
AST: 18 U/L (ref 15–41)
Albumin: 3.6 g/dL (ref 3.5–5.0)
Alkaline Phosphatase: 113 U/L (ref 38–126)
Anion gap: 10 (ref 5–15)
BUN: 26 mg/dL — ABNORMAL HIGH (ref 8–23)
CO2: 24 mmol/L (ref 22–32)
Calcium: 9.5 mg/dL (ref 8.9–10.3)
Chloride: 102 mmol/L (ref 98–111)
Creatinine, Ser: 0.84 mg/dL (ref 0.44–1.00)
GFR, Estimated: >60 mL/min (ref >60)
Glucose, Bld: 283 mg/dL — ABNORMAL HIGH (ref 70–99)
Potassium: 3.7 mmol/L (ref 3.5–5.1)
Sodium: 136 mmol/L (ref 135–145)
Total Bilirubin: 0.5 mg/dL (ref 0.3–1.2)
Total Protein: 8.2 g/dL — ABNORMAL HIGH (ref 6.5–8.1)

## 2021-06-15 LAB — CBC
HCT: 43 % (ref 36.0–46.0)
Hemoglobin: 14.3 g/dL (ref 12.0–15.0)
MCH: 27.1 pg (ref 26.0–34.0)
MCHC: 33.3 g/dL (ref 30.0–36.0)
MCV: 81.4 fL (ref 80.0–100.0)
Platelets: 260 10*3/uL (ref 150–400)
RBC: 5.28 MIL/uL — ABNORMAL HIGH (ref 3.87–5.11)
RDW: 14.2 % (ref 11.5–15.5)
WBC: 8.2 10*3/uL (ref 4.0–10.5)
nRBC: 0 % (ref 0.0–0.2)

## 2021-06-15 LAB — RAPID URINE DRUG SCREEN, HOSP PERFORMED
Amphetamines: NOT DETECTED
Barbiturates: NOT DETECTED
Benzodiazepines: NOT DETECTED
Cocaine: NOT DETECTED
Opiates: NOT DETECTED
Tetrahydrocannabinol: NOT DETECTED

## 2021-06-15 LAB — PROTIME-INR
INR: 1 (ref 0.8–1.2)
Prothrombin Time: 13 seconds (ref 11.4–15.2)

## 2021-06-15 LAB — DIFFERENTIAL
Abs Immature Granulocytes: 0.04 10*3/uL (ref 0.00–0.07)
Basophils Absolute: 0.1 10*3/uL (ref 0.0–0.1)
Basophils Relative: 1 %
Eosinophils Absolute: 0.5 10*3/uL (ref 0.0–0.5)
Eosinophils Relative: 6 %
Immature Granulocytes: 1 %
Lymphocytes Relative: 23 %
Lymphs Abs: 1.9 10*3/uL (ref 0.7–4.0)
Monocytes Absolute: 0.6 10*3/uL (ref 0.1–1.0)
Monocytes Relative: 7 %
Neutro Abs: 5.2 10*3/uL (ref 1.7–7.7)
Neutrophils Relative %: 62 %

## 2021-06-15 LAB — APTT: aPTT: 22 seconds — ABNORMAL LOW (ref 24–36)

## 2021-06-15 LAB — ETHANOL: Alcohol, Ethyl (B): <10 mg/dL (ref <10)

## 2021-06-15 IMAGING — CT CT HEAD W/O CM
3 series · 15 of 47 positions shown, 18 images · non-contrast
Comparison: [DATE]

CLINICAL DATA: TIA.  Left-sided temporal headache with hypertension



[Series 2: head wo · axial · 0.47mm/px · z∈[-132,-7]mm · 9 of 31 slices shown, 12 images]
[im 3/31  brain]
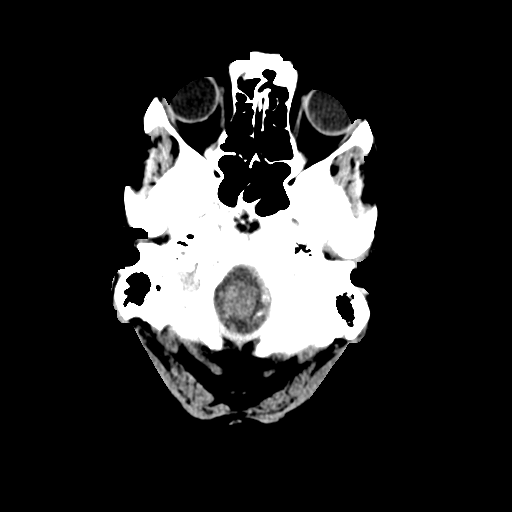
[im 3/31  bone]
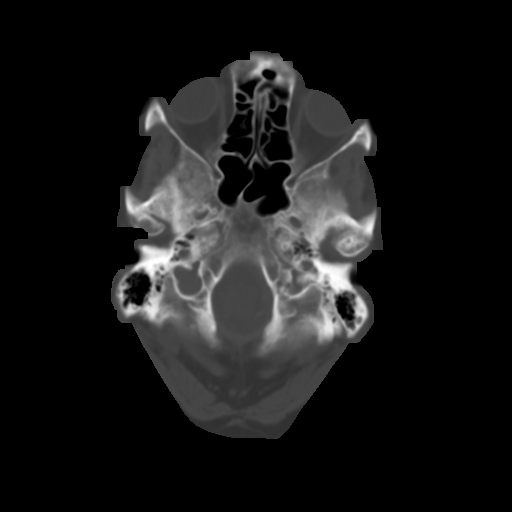
[im 6/31  brain]
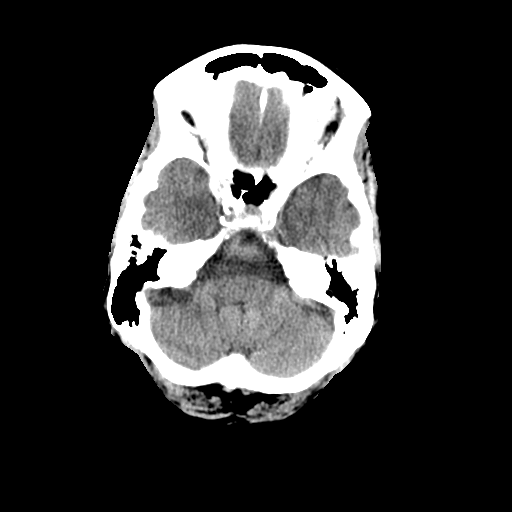
[im 9/31  brain]
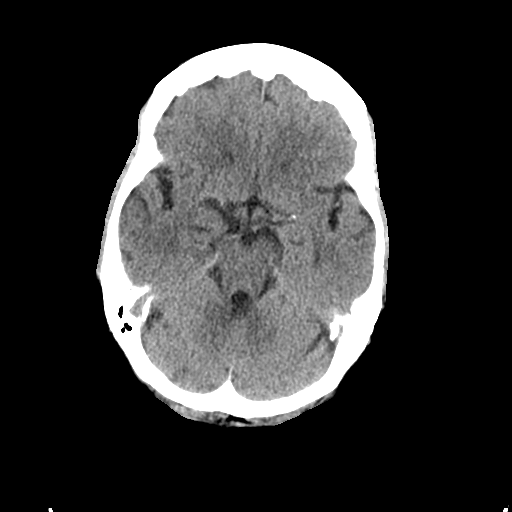
[im 12/31  brain]
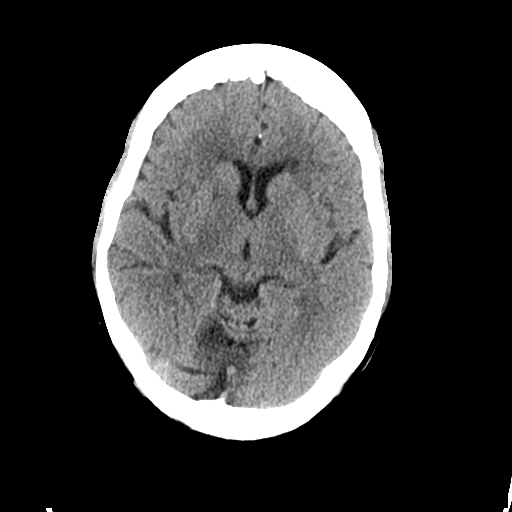
[im 16/31  brain]
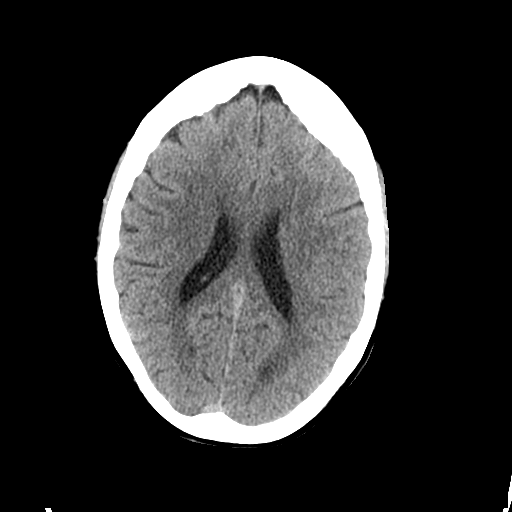
[im 16/31  bone]
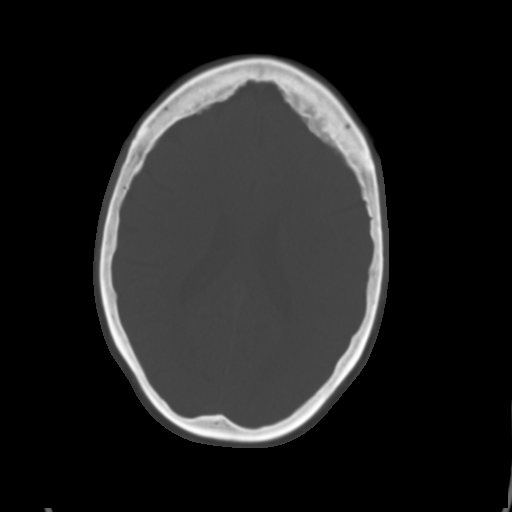
[im 19/31  brain]
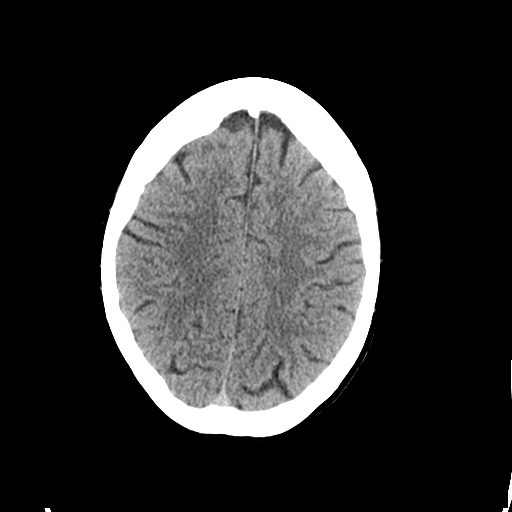
[im 22/31  brain]
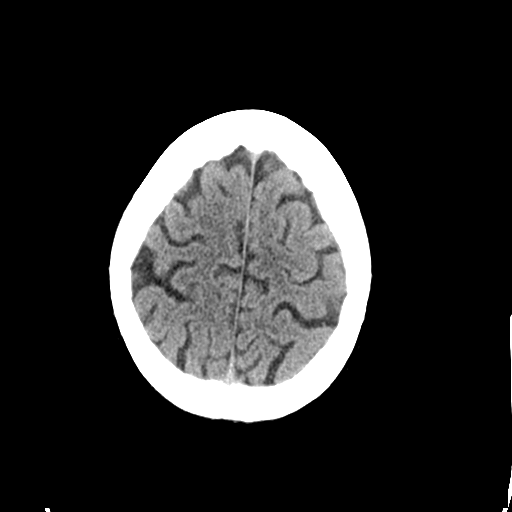
[im 25/31  brain]
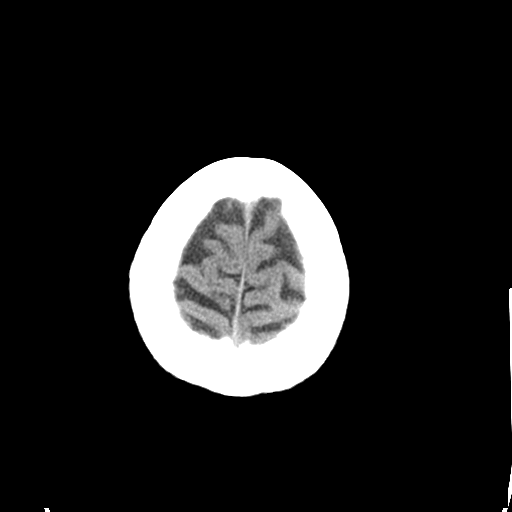
[im 28/31  brain]
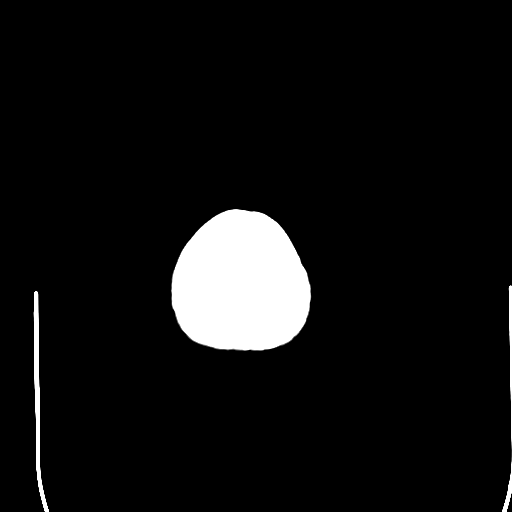
[im 28/31  bone]
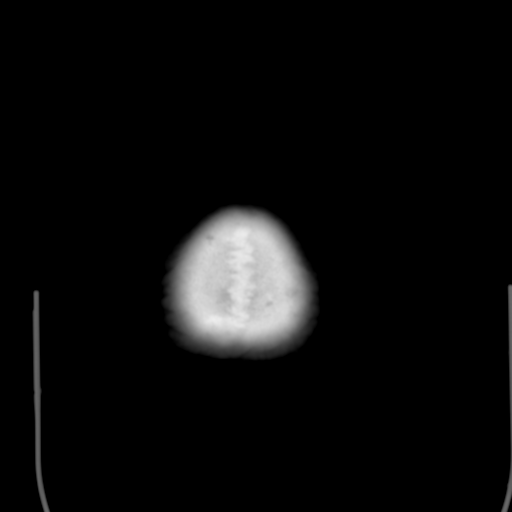

[Series 5: coronal soft tissue · coronal · 0.31mm/px · 3 of 70 slices shown]
[im 24/70  brain]
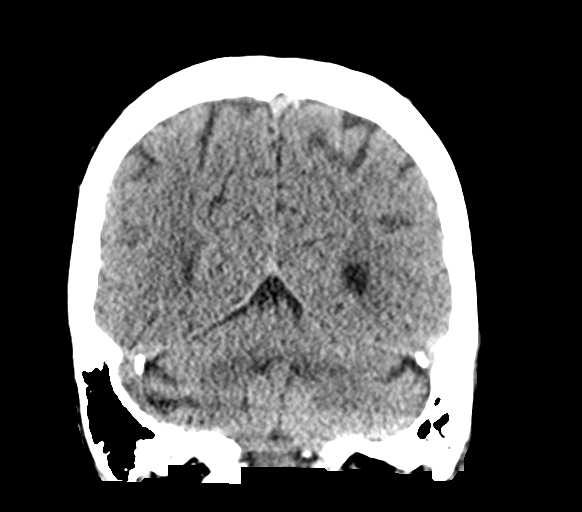
[im 31/70  brain]
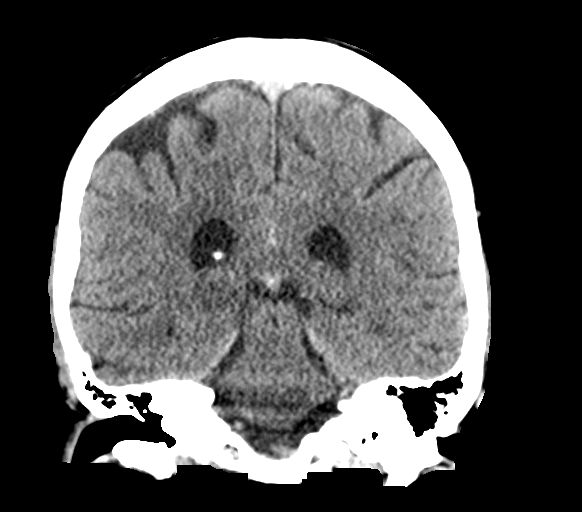
[im 39/70  brain]
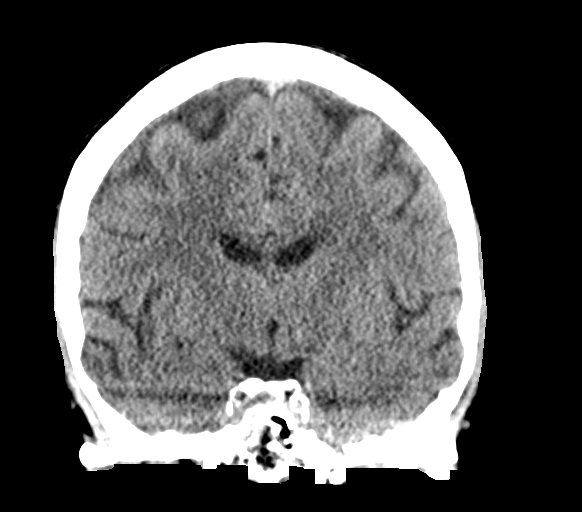

[Series 6: sagittal soft tissue · sagittal · 0.31mm/px · 3 of 62 slices shown]
[im 21/62  brain]
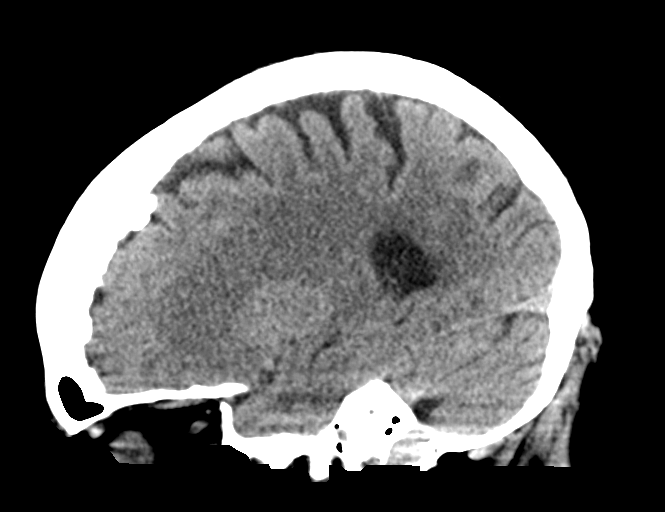
[im 31/62  brain]
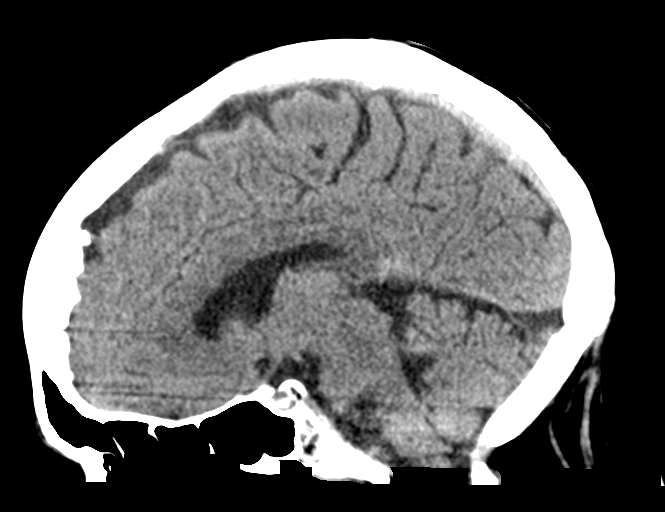
[im 41/62  brain]
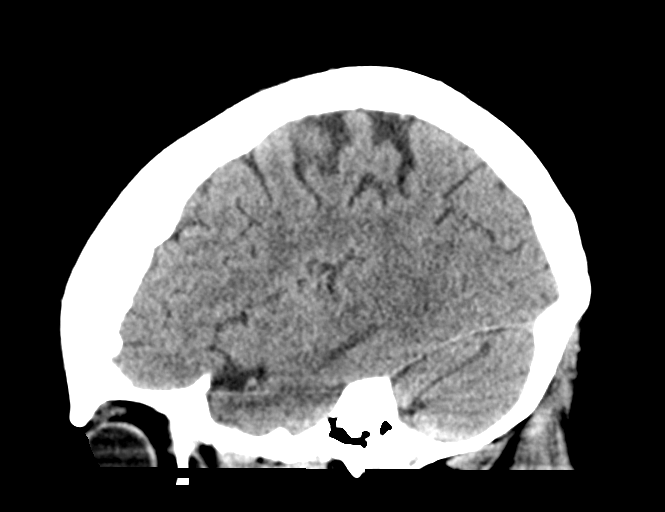

[15 of 47 positions shown; findings below may reference images not displayed]

FINDINGS: Brain: No evidence of acute infarction, hemorrhage, hydrocephalus,
extra-axial collection or mass lesion/mass effect. Dilated
perivascular space below the right putamen. Expected brain volume
and white matter appearance

Vascular: No hyperdense vessel or unexpected calcification.

Skull: Normal. Negative for fracture or focal lesion.

Sinuses/Orbits: No acute finding.
IMPRESSION: No acute or interval finding.

## 2021-06-15 IMAGING — MR MR HEAD W/O CM
10 series · 44 of 48 positions shown · non-contrast
Comparison: Head CT from earlier the same day

CLINICAL DATA: TIA

EXAM:
MRI HEAD WITHOUT CONTRAST
TECHNIQUE: Multiplanar, multiecho pulse sequences of the brain and surrounding
structures were obtained without intravenous contrast.

[Series 5: dwi_tracew · axial · 3.0mm · 1.08mm/px · z∈[-50,+100]mm · 8 of 102 slices shown]
[im 1/102]
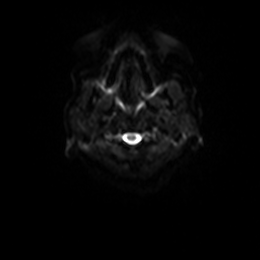
[im 21/102]
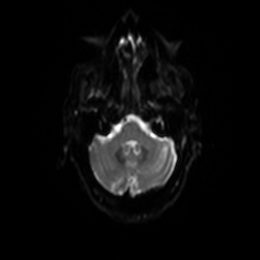
[im 31/102]
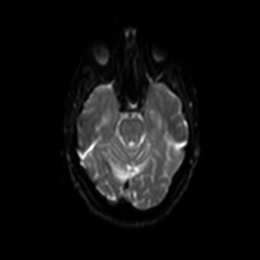
[im 41/102]
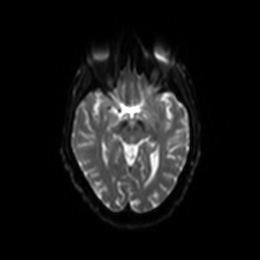
[im 61/102]
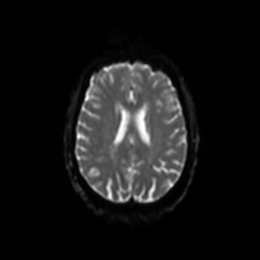
[im 71/102]
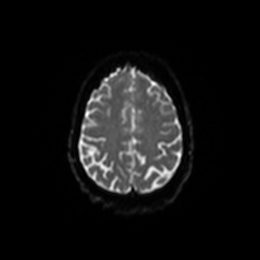
[im 81/102]
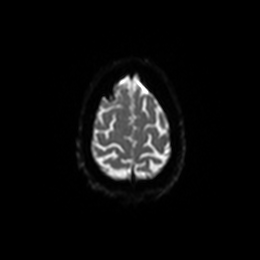
[im 102/102]
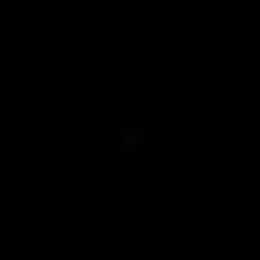

[Series 6: dwi_adc · axial · 3.0mm · 1.08mm/px · z∈[-50,+70]mm · 5 of 51 slices shown]
[im 1/51]
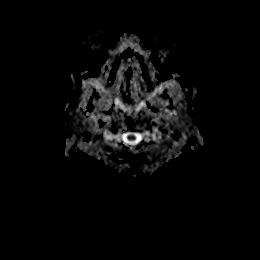
[im 11/51]
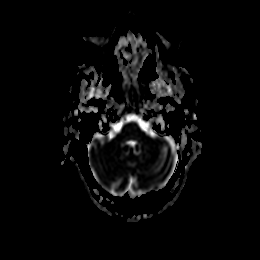
[im 21/51]
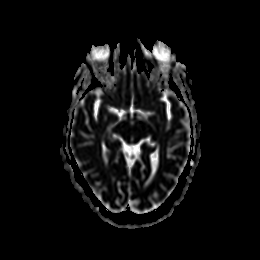
[im 31/51]
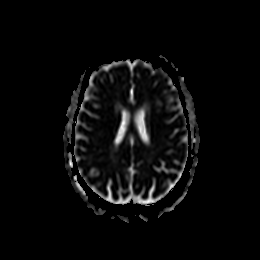
[im 41/51]
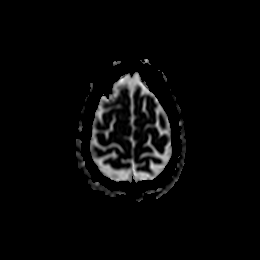

[Series 7: T2 · sagittal · 5.0mm · 0.47mm/px · 2 of 24 slices shown (1 of 3)]
[im 1/24]
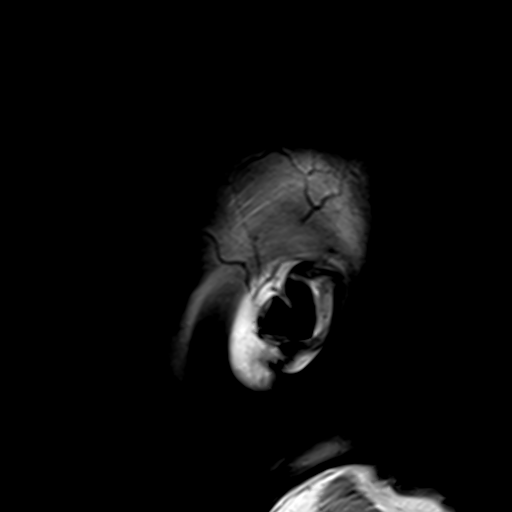
[im 24/24]
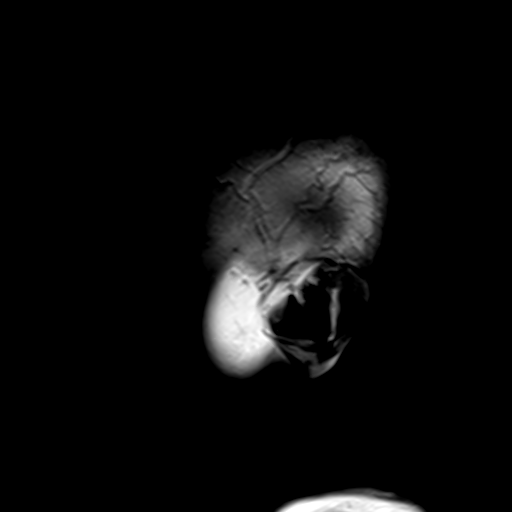

[Series 8: T2 · axial · 5.0mm · 0.45mm/px · z∈[-51,+98]mm · 2 of 24 slices shown (2 of 3)]
[im 1/24]
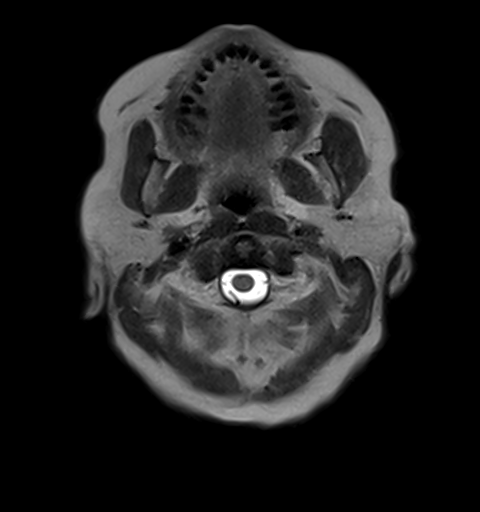
[im 24/24]
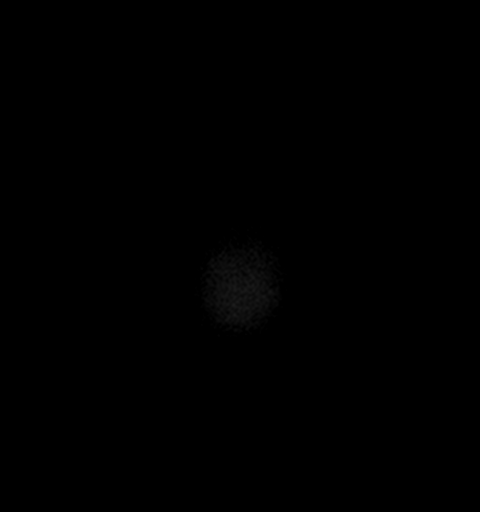

[Series 9: GRE · axial · 3.0mm · 0.45mm/px · z∈[-52,+98]mm · 5 of 51 slices shown]
[im 1/51]
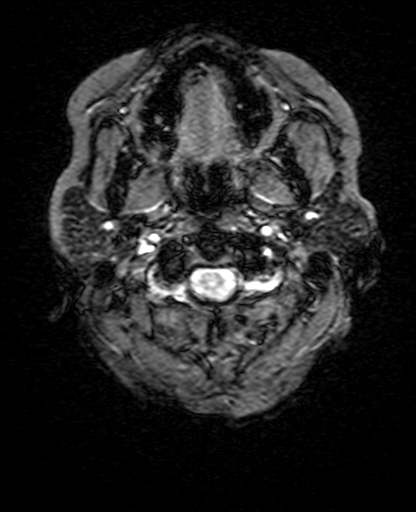
[im 13/51]
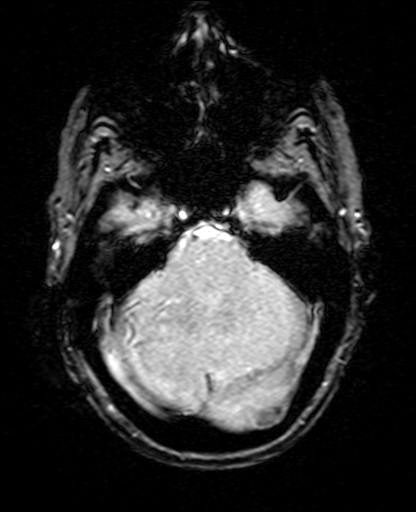
[im 26/51]
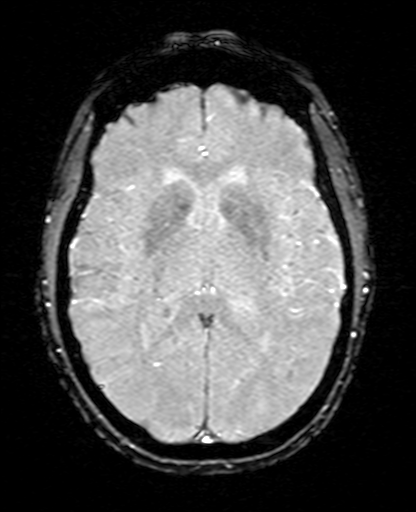
[im 38/51]
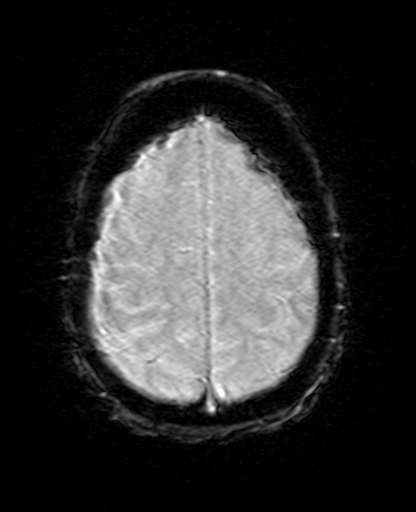
[im 51/51]
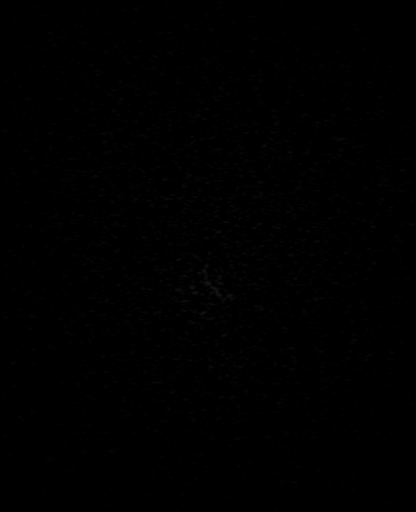

[Series 10: FLAIR · axial · 3.0mm · 0.86mm/px · z∈[-52,+98]mm · 5 of 51 slices shown]
[im 1/51]
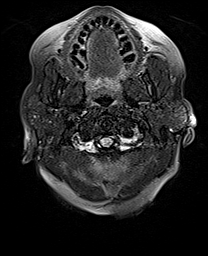
[im 13/51]
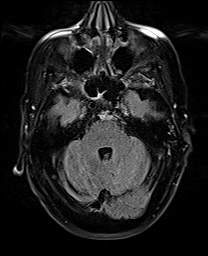
[im 26/51]
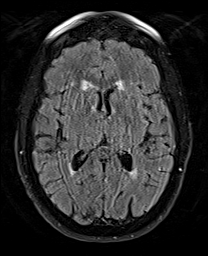
[im 38/51]
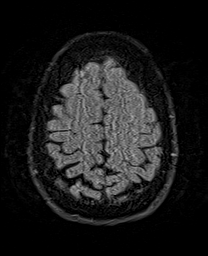
[im 51/51]
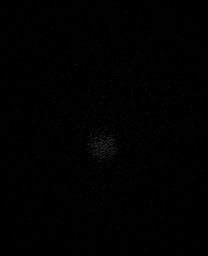

[Series 11: T1 · axial · 3.0mm · 0.45mm/px · z∈[-52,+98]mm · 5 of 51 slices shown]
[im 1/51]
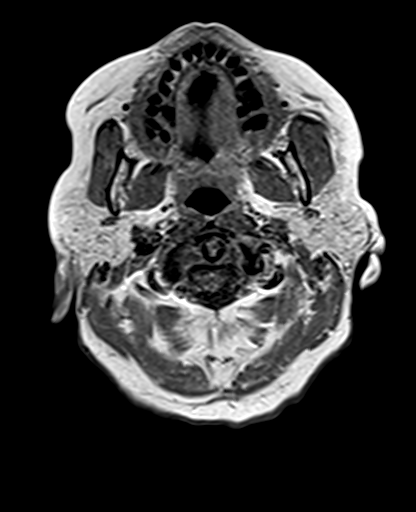
[im 13/51]
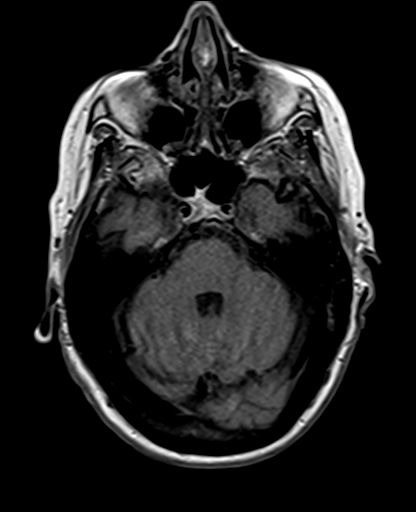
[im 26/51]
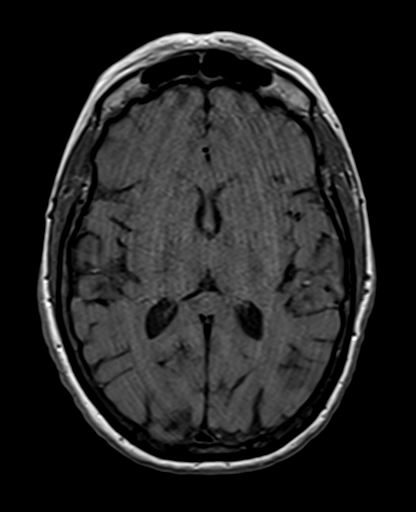
[im 38/51]
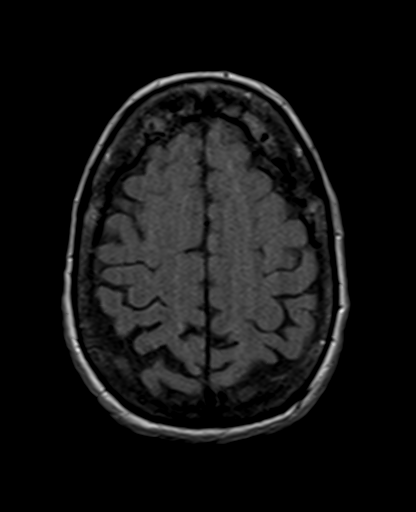
[im 51/51]
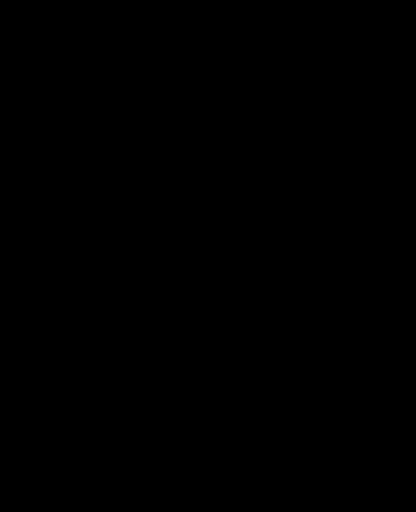

[Series 12: DWI · coronal · 5.0mm · 1.31mm/px · 6 of 56 slices shown (1 of 2)]
[im 1/56]
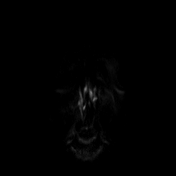
[im 12/56]
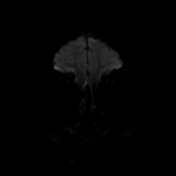
[im 23/56]
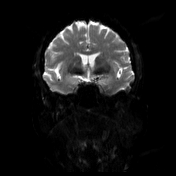
[im 34/56]
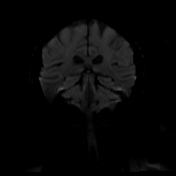
[im 45/56]
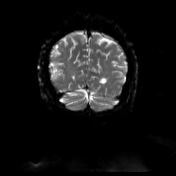
[im 56/56]
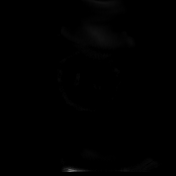

[Series 13: DWI · coronal · 5.0mm · 1.31mm/px · 3 of 28 slices shown (2 of 2)]
[im 1/28]
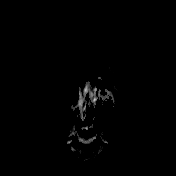
[im 14/28]
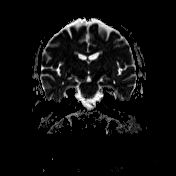
[im 28/28]
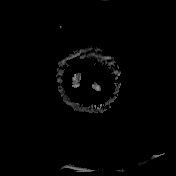

[Series 14: T2 · coronal · 5.0mm · 0.86mm/px · 3 of 30 slices shown (3 of 3)]
[im 1/30]
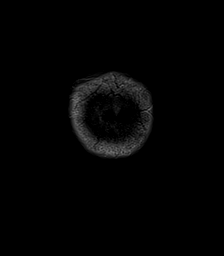
[im 15/30]
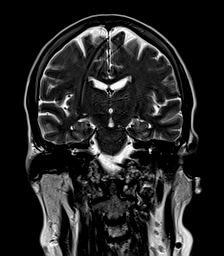
[im 30/30]
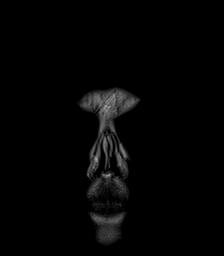

[44 of 48 positions shown; findings below may reference images not displayed]

FINDINGS: Brain: No acute infarction, hemorrhage, hydrocephalus, extra-axial
collection or mass lesion. Mild chronic vessel ischemia and
hemispheric white matter. Age normal brain volume

Vascular: Major flow voids are preserved

Skull and upper cervical spine: Prominent cervical spine
degeneration where covered, also seen on prior. Multiple disc
protrusions are likely present, with possible cord mass effect. No
focal marrow lesion

Sinuses/Orbits: Minimal left mastoid opacification with negative
nasopharynx.

Other: Motion degraded study, fast brain protocol is needed
IMPRESSION: 1. Motion degraded brain MRI without acute finding.
2. Mild chronic small vessel ischemia.
3. Prominent cervical spine degeneration which could cause cord
impingement

## 2021-06-15 MED ORDER — LORAZEPAM 1 MG PO TABS
1.0000 mg | ORAL_TABLET | Freq: Once | ORAL | Status: AC
  Administered 2021-06-15: 1 mg via ORAL
  Filled 2021-06-15: qty 1

## 2021-06-15 MED ORDER — ACETAMINOPHEN 500 MG PO TABS
1000.0000 mg | ORAL_TABLET | Freq: Four times a day (QID) | ORAL | Status: DC | PRN
Start: 2021-06-15 — End: 2021-06-15
  Filled 2021-06-15: qty 2

## 2021-06-15 MED ORDER — ACETAMINOPHEN 500 MG PO TABS
1000.0000 mg | ORAL_TABLET | ORAL | Status: AC
  Administered 2021-06-15: 1000 mg via ORAL

## 2021-06-15 MED ORDER — NITROFURANTOIN MONOHYD MACRO 100 MG PO CAPS: 100.0000 mg | ORAL_CAPSULE | Freq: Two times a day (BID) | ORAL | 0 refills | Status: DC

## 2021-06-15 NOTE — ED Provider Notes (Signed)
[facility] DEPT Provider Note   CSN: [id number] Arrival date & time: 06/15/21  8546     History  Chief Complaint  Patient presents with   Aphasia    Meagan [name] is a 76 y.o. female with history significant for anemia, anxiety, arthritis, cancer, diabetes, GERD, hypertension, history of possible TIA in February, UTI causing sepsis and hospitalization in March.  Patient presents to ED with husband for evaluation of word finding difficulty.  Patient reports that this morning, she was sleeping in her recliner which is where she typically sleeps when she was suddenly "startled" awake.  Patient reports at this time she went upstairs to find her husband.  The patient states that she "just did not feel right", she cannot elaborate or specify what she means by this.  Patient husband reports that the patient was unable to voice what it was she needed.  The patient's husband states that the patient wished for the window to be opened however could not request this verbally and instead was just pointing at the window.  Patient husband states that they took her blood pressure at this time and he reports that being "200/100" but cannot specify more accurately the numbers.  They also took her blood sugar at this time and they found it to be around 269.  On examination, the patient is endorsing headache.  The patient denies any numbness, weakness, current word finding difficulty, fevers, nausea, vomiting, dysuria.   HPI     Home Medications Prior to Admission medications   Medication Sig Start Date End Date Taking? Authorizing Provider  nitrofurantoin, macrocrystal-monohydrate, (MACROBID) 100 MG capsule Take 1 capsule (100 mg total) by mouth 2 (two) times daily. 06/15/21  Yes Azucena Cecil, PA-C  acetaminophen (TYLENOL) 500 MG tablet Take 500 mg by mouth every 6 (six) hours as needed for mild pain or moderate pain.    [provider]  aspirin EC 81 MG  tablet Take 81 mg by mouth daily. Swallow whole.    [provider]  famotidine (PEPCID) 20 MG tablet Take 20 mg by mouth daily. 07/01/20   [provider]  folic acid (FOLVITE) 1 MG tablet Take 1 tablet (1 mg total) by mouth daily. 04/17/21   Mercy Riding, MD  glimepiride (AMARYL) 4 MG tablet Take 1 tablet (4 mg total) by mouth 2 (two) times daily. 08/26/20   Marin Olp, MD  mometasone (ELOCON) 0.1 % lotion Apply 1 application topically daily. 05/28/20   [provider]  rosuvastatin (CRESTOR) 5 MG tablet Take 1 tablet (5 mg total) by mouth once a week. 04/21/21   Marin Olp, MD  senna-docusate (SENOKOT-S) 8.6-50 MG tablet Take 1 tablet by mouth 2 (two) times daily as needed for moderate constipation or mild constipation. 04/17/21   Mercy Riding, MD  vitamin B-12 1000 MCG tablet Take 1 tablet (1,000 mcg total) by mouth daily. 04/18/21   Mercy Riding, MD      Allergies    Penicillin g, Doxycycline hyclate, Shrimp [shellfish allergy], Sinutab sinus max st [phenylephrine-acetaminophen], and Valacyclovir    Review of Systems   Review of Systems  Constitutional:  Negative for fever.  Gastrointestinal:  Negative for nausea and vomiting.  Genitourinary:  Negative for dysuria.  Neurological:  Positive for headaches. Negative for speech difficulty, weakness and numbness.  All other systems reviewed and are negative.  Physical Exam Updated Vital Signs BP (!) 160/75 (BP Location: Left Arm)  Pulse 86   Temp 98.2 F (36.8 C) (Oral)   Resp 18   Ht '5\' 2"'$  (1.575 m)   Wt 74.8 kg   LMP 01/18/2001   SpO2 99%   BMI 30.18 kg/m  Physical Exam Vitals and nursing note reviewed.  Constitutional:      General: She is not in acute distress.    Appearance: Normal appearance. She is not ill-appearing, toxic-appearing or diaphoretic.  HENT:     Head: Normocephalic and atraumatic.     Nose: Nose normal. No congestion.     Mouth/Throat:     Mouth: Mucous membranes are  moist.     Pharynx: Oropharynx is clear.  Eyes:     Extraocular Movements: Extraocular movements intact.     Conjunctiva/sclera: Conjunctivae normal.     Pupils: Pupils are equal, round, and reactive to light.  Cardiovascular:     Rate and Rhythm: Normal rate and regular rhythm.  Pulmonary:     Effort: Pulmonary effort is normal.     Breath sounds: Normal breath sounds. No wheezing.  Abdominal:     General: Abdomen is flat.     Palpations: Abdomen is soft.     Tenderness: There is no abdominal tenderness.  Musculoskeletal:     Cervical back: Normal range of motion and neck supple. No tenderness.  Skin:    General: Skin is warm and dry.     Capillary Refill: Capillary refill takes less than 2 seconds.  Neurological:     General: No focal deficit present.     Mental Status: She is alert and oriented to person, place, and time.     GCS: GCS eye subscore is 4. GCS verbal subscore is 5. GCS motor subscore is 6.     Cranial Nerves: Cranial nerves 2-12 are intact. No cranial nerve deficit.     Sensory: Sensation is intact. No sensory deficit.     Motor: Motor function is intact. No weakness.     Coordination: Coordination is intact. Heel to [facility] Test normal.    ED Results / Procedures / Treatments   Labs (all labs ordered are listed, but only abnormal results are displayed) Labs Reviewed  APTT - Abnormal; Notable for the following components:      Result Value   aPTT 22 (*)    All other components within normal limits  CBC - Abnormal; Notable for the following components:   RBC 5.28 (*)    All other components within normal limits  COMPREHENSIVE METABOLIC PANEL - Abnormal; Notable for the following components:   Glucose, Bld 283 (*)    BUN 26 (*)    Total Protein 8.2 (*)    All other components within normal limits  URINALYSIS, ROUTINE W REFLEX MICROSCOPIC - Abnormal; Notable for the following components:   Color, Urine STRAW (*)    Glucose, UA >=500 (*)    Hgb urine  dipstick SMALL (*)    Protein, ur 100 (*)    Leukocytes,Ua TRACE (*)    Bacteria, UA RARE (*)    All other components within normal limits  ETHANOL  PROTIME-INR  DIFFERENTIAL  RAPID URINE DRUG SCREEN, HOSP PERFORMED    EKG EKG Interpretation  Date/Time:  Monday Jun 15 2021 06:34:19 EDT Ventricular Rate:  99 PR Interval:  179 QRS Duration: 169 QT Interval:  378 QTC Calculation: 486 R Axis:   -10 Text Interpretation: Sinus rhythm Left bundle branch block Confirmed by Nanda Quinton [phone]) on 06/15/2021 6:36:03 AM  Radiology  CT HEAD WO CONTRAST  Result Date: 06/15/2021 CLINICAL DATA:  TIA.  Left-sided temporal headache with hypertension EXAM: CT HEAD WITHOUT CONTRAST TECHNIQUE: Contiguous axial images were obtained from the base of the skull through the vertex without intravenous contrast. RADIATION DOSE REDUCTION: This exam was performed according to the departmental dose-optimization program which includes automated exposure control, adjustment of the mA and/or kV according to patient size and/or use of iterative reconstruction technique. COMPARISON:  03/02/2021 FINDINGS: Brain: No evidence of acute infarction, hemorrhage, hydrocephalus, extra-axial collection or mass lesion/mass effect. Dilated perivascular space below the right putamen. Expected brain volume and white matter appearance Vascular: No hyperdense vessel or unexpected calcification. Skull: Normal. Negative for fracture or focal lesion. Sinuses/Orbits: No acute finding. IMPRESSION: No acute or interval finding. Electronically Signed   By: Jorje Guild M.D.   On: 06/15/2021 07:15   MR BRAIN WO CONTRAST  Result Date: 06/15/2021 CLINICAL DATA:  TIA EXAM: MRI HEAD WITHOUT CONTRAST TECHNIQUE: Multiplanar, multiecho pulse sequences of the brain and surrounding structures were obtained without intravenous contrast. COMPARISON:  Head CT from earlier the same day FINDINGS: Brain: No acute infarction, hemorrhage, hydrocephalus,  extra-axial collection or mass lesion. Mild chronic vessel ischemia and hemispheric white matter. Age normal brain volume Vascular: Major flow voids are preserved Skull and upper cervical spine: Prominent cervical spine degeneration where covered, also seen on prior. Multiple disc protrusions are likely present, with possible cord mass effect. No focal marrow lesion Sinuses/Orbits: Minimal left mastoid opacification with negative nasopharynx. Other: Motion degraded study, fast brain protocol is needed IMPRESSION: 1. Motion degraded brain MRI without acute finding. 2. Mild chronic small vessel ischemia. 3. Prominent cervical spine degeneration which could cause cord impingement Electronically Signed   By: Jorje Guild M.D.   On: 06/15/2021 11:41    Procedures Procedures   Medications Ordered in ED Medications  LORazepam (ATIVAN) tablet 1 mg (1 mg Oral Given 06/15/21 0949)  acetaminophen (TYLENOL) tablet 1,000 mg (1,000 mg Oral Given 06/15/21 0930)    ED Course/ Medical Decision Making/ A&P                           Medical Decision Making Amount and/or Complexity of Data Reviewed Radiology: ordered.  Risk OTC drugs. Prescription drug management.   77 year old female presents to the ED for evaluation.  Please see HPI for further details.  On examination, the patient is afebrile, nontachycardic.  The patient lung sounds are clear bilaterally and she is not hypoxic on room air.  The patient is alert and oriented x3, she follows commands appropriately.  There is no focal neurodeficit noted on her neurological examination.  There is no facial droop.  The patient speaks in fluent and coherent sentences.  Patient worked up utilizing following labs and imaging studies interpreted by me personally: - CBC shows no elevated white blood cell count - CMP shows elevated glucose to 283, elevated BUN to 26.  The patient has a documented history of uncontrolled diabetes. - APTT 22 - PT/INR  unremarkable - Urinalysis shows leukocytes, rare bacteria, small hemoglobin.  Patient replaced on 5 day course of macrobid. - Rapid urine drug screen unremarkable - Ethanol unremarkable - CT head shows no intracranial abnormality, midline shift, herniation - MR brain without contrast shows chronic small vessel disease however does not show any signs of infarct or midline shift.  At this time, no clear etiology of patient's symptoms have been discovered.  The nurse notified  me that the patient was requesting discharge due to the time she has been here in the department.  I reengage the patient and I had advised the patient that I would like to talk to neurology prior to discharging her however she states that she can follow-up with neurology as an outpatient.  I advised the patient that I will refer her to a neurologist and I highly suggest that she follows up with them.  I once again offered to discuss with the on-call neurologist her symptoms but she stated that she was to be discharged.  Patient return precautions given.  Patient referral placed.  Patient had all her questions answered to her satisfaction.  Patient stable at this time for discharge home.   Final Clinical Impression(s) / ED Diagnoses Final diagnoses:  Aphasia    Rx / DC Orders ED Discharge Orders          Ordered    nitrofurantoin, macrocrystal-monohydrate, (MACROBID) 100 MG capsule  2 times daily        06/15/21 1211              Azucena Cecil, PA-C 06/15/21 1213    Godfrey Pick, MD 06/16/21 [phone]

## 2021-06-15 NOTE — Discharge Instructions (Addendum)
Please return to the ED with any new symptoms such as numbness, weakness, word finding difficulty, facial droop, slurred speech Please follow-up with the neurologist I referred you to.  Please call and make an appointment to be seen. Please read attached informational guide concerning aphasia or word finding difficulty

## 2021-06-15 NOTE — ED Triage Notes (Signed)
Pt states that when she woke up this morning she was having trouble forming her words and sentences. Pt also complains of a headache. Pt states that her speech is better upon arriving here.

## 2021-06-15 NOTE — ED Notes (Signed)
Called on call MRI tech to come in, Precious Reel.

## 2021-06-15 NOTE — ED Notes (Signed)
Patient transported to MRI via wheelchair w/ MRI staff

## 2021-06-18 ENCOUNTER — Ambulatory Visit (INDEPENDENT_AMBULATORY_CARE_PROVIDER_SITE_OTHER): Payer: Medicare HMO | Admitting: Family Medicine

## 2021-06-18 ENCOUNTER — Encounter: Payer: Self-pay | Admitting: Family Medicine

## 2021-06-18 VITALS — BP 124/60 | HR 59 | Temp 96.8°F | Ht 62.0 in | Wt 166.6 lb

## 2021-06-18 DIAGNOSIS — M4802 Spinal stenosis, cervical region: Secondary | ICD-10-CM

## 2021-06-18 DIAGNOSIS — N1831 Chronic kidney disease, stage 3a: Secondary | ICD-10-CM | POA: Diagnosis not present

## 2021-06-18 DIAGNOSIS — E785 Hyperlipidemia, unspecified: Secondary | ICD-10-CM | POA: Diagnosis not present

## 2021-06-18 DIAGNOSIS — E1169 Type 2 diabetes mellitus with other specified complication: Secondary | ICD-10-CM

## 2021-06-18 DIAGNOSIS — E1165 Type 2 diabetes mellitus with hyperglycemia: Secondary | ICD-10-CM

## 2021-06-18 DIAGNOSIS — I10 Essential (primary) hypertension: Secondary | ICD-10-CM | POA: Diagnosis not present

## 2021-06-18 LAB — POCT GLYCOSYLATED HEMOGLOBIN (HGB A1C): Hemoglobin A1C: 8.7 % — AB (ref 4.0–5.6)

## 2021-06-18 MED ORDER — DOXAZOSIN MESYLATE 2 MG PO TABS: 2.0000 mg | ORAL_TABLET | Freq: Every day | ORAL | 3 refills | Status: DC

## 2021-06-18 MED ORDER — ALPRAZOLAM 0.25 MG PO TABS: ORAL_TABLET | ORAL | 0 refills | Status: DC

## 2021-06-18 MED ORDER — ROSUVASTATIN CALCIUM 5 MG PO TABS: 5.0000 mg | ORAL_TABLET | ORAL | 3 refills | Status: DC

## 2021-06-18 NOTE — Patient Instructions (Addendum)
We will call you within two weeks about your referral for MRI cervica spine. If you do not hear within 2 weeks, give [place] a call.   Declines colonoscopy   A1c is high at 8.7 - you wanted to focus on healthy eating and improving exercise -declined all other medication options discussed today- let me know if you change your mind  Recommended follow up: Return in about 4 months (around 10/18/2021) for followup or sooner if needed.Schedule b4 you leave.

## 2021-06-18 NOTE — Progress Notes (Signed)
Phone [phone] In person visit   Subjective:   Meagan [name] is a 76 y.o. year old very pleasant female patient who presents for/with See problem oriented charting Chief Complaint  Patient presents with   Follow-up    Pt is f/u from recent hosp visit and is taking antibiotic for UTI.   Diabetes   Colonoscopy    Pt is due but husband states pt is to old to have this done.    Past Medical History-  Patient Active Problem List   Diagnosis Date Noted   E coli bacteremia 04/17/2021    Priority: High   History of TIA (transient ischemic attack) 04/15/2021    Priority: High   Diabetes mellitus, type 2 ([place]) 02/12/2015    Priority: High   Normocytic anemia 04/16/2021    Priority: Medium    Chronic kidney disease, stage 3a ([place]) 04/15/2021    Priority: Medium    Acid reflux 02/12/2015    Priority: Medium    Hyperlipidemia associated with type 2 diabetes mellitus ([place]) 02/12/2015    Priority: Medium    Primary hypertension 12/08/2011    Priority: Medium    GERD (gastroesophageal reflux disease) 03/03/2021    Priority: Low   Dermatitis, eczematoid 02/12/2015    Priority: Low   History of endometrial cancer 12/08/2011    Priority: Low    Medications- reviewed and updated Current Outpatient Medications  Medication Sig Dispense Refill   acetaminophen (TYLENOL) 500 MG tablet Take 500 mg by mouth every 6 (six) hours as needed for mild pain or moderate pain.     ALPRAZolam (XANAX) 0.25 MG tablet 30 minutes before MRI cervica spine 1 tablet 0   aspirin EC 81 MG tablet Take 81 mg by mouth daily. Swallow whole.     doxazosin (CARDURA) 2 MG tablet Take 1 tablet (2 mg total) by mouth daily. 90 tablet 3   famotidine (PEPCID) 20 MG tablet Take 20 mg by mouth daily.     folic acid (FOLVITE) 1 MG tablet Take 1 tablet (1 mg total) by mouth daily. 90 tablet 1   glimepiride (AMARYL) 4 MG tablet Take 1 tablet (4 mg total) by mouth 2 (two) times daily. 180 tablet 3   mometasone (ELOCON)  0.1 % lotion Apply 1 application topically daily.     nitrofurantoin, macrocrystal-monohydrate, (MACROBID) 100 MG capsule Take 1 capsule (100 mg total) by mouth 2 (two) times daily. 10 capsule 0   senna-docusate (SENOKOT-S) 8.6-50 MG tablet Take 1 tablet by mouth 2 (two) times daily as needed for moderate constipation or mild constipation.     vitamin B-12 1000 MCG tablet Take 1 tablet (1,000 mcg total) by mouth daily. 90 tablet 1   rosuvastatin (CRESTOR) 5 MG tablet Take 1 tablet (5 mg total) by mouth once a week. 13 tablet 3   No current facility-administered medications for this visit.     Objective:  BP 124/60   Pulse (!) 59   Temp (!) 96.8 F (36 C)   Ht '5\' 2"'$  (1.575 m)   Wt 166 lb 9.6 oz (75.6 kg)   LMP 01/18/2001   SpO2 96%   BMI 30.47 kg/m  Gen: NAD, resting comfortably CV: RRR no murmurs rubs or gallops Lungs: CTAB no crackles, wheeze, rhonchi Ext: no edema Skin: warm, dry    Assessment and Plan   #ED follow-up for ? Aphasia ? UTI S: Patient was seen in the emergency room for potential aphasia- husband states episode lasted 45 minutes  and she couldn't put words together well- on way to hospital started feeling better.  She was sitting in lounge chair when issues started but was not lounging. Also reported temporal headache once she got to the hospital (not before)and blood pressure was noted to be elevated.  No focal deficits on exam.  Sugar was elevated to 283, no leukocytosis reported.  CT of the head and MRI of the brain study negative for acute findings (there were several areas of potential disc protrusions from cervical spine with potential for impingement but imaging was motion degraded)-neurology follow-up was recommended-possible TIA but urine also concerning for UTI and was started on Macrobid but ultimately urine culture did not show UTI-under 10,000 colonies and insignificant growth  Does report intermittent neck pain after falling off bed 2 months ago (had not  previously been reported she says)- was reaching for phone and went too far- hit head on carpet. On MRI bulging discs noted.  A/P: does not appear UTI was the cause of symptoms. ? TIA history. I wonder if compression in cervical spine with stenosis coud contribute to symptoms- increase intracranial pressure? Will get MR cervical spine for more info - consider neurosurgery or neurology consult. -she prefers to finish macrobid   # Diabetes S: Medication: Glimepiride 4 mg twice daily Exercise and diet- agrees to try to start exercising.  Lab Results  Component Value Date   HGBA1C 8.7 (A) 06/18/2021   HGBA1C 8.9 (H) 03/03/2021   HGBA1C 9.4 (H) 02/26/2021   A/P: very poor control. A1c is high at 8.7. continue glimepiride  - you wanted to focus on healthy eating and improving exercise -declined all other medication options discussed today- let me know if you change your mind    #hypertension S: medication:  doxazosin 2 mg -prior on triamterene hctz but with hospitalization thought dehydrating potentially and stopped in 2023 BP Readings from Last 3 Encounters:  06/18/21 124/60  06/15/21 (!) 160/75  05/29/21 (!) 148/78  A/P: Controlled. Continue current medications.   #hyperlipidemia S: Medication: rosuvastatin 5 mg once a week prescriped but she never received this Lab Results  Component Value Date   CHOL 151 03/03/2021   HDL 44 03/03/2021   LDLCALC 79 03/03/2021   TRIG 140 03/03/2021   CHOLHDL 3.4 03/03/2021   A/P: poor control with LDL over 70 with potential TIA history- recommended she start this- check lipids 6 weeks after starting at least  Recommended follow up: Return in about 4 months (around 10/18/2021) for followup or sooner if needed.Schedule b4 you leave.  Lab/Order associations:   ICD-10-CM   1. Type 2 diabetes mellitus with hyperglycemia, without long-term current use of insulin (HCC)  E11.65 POCT HgB A1C    2. Primary hypertension  I10     3. Hyperlipidemia  associated with type 2 diabetes mellitus ([place])  E11.69    E78.5     4. Chronic kidney disease, stage 3a ([place])  N18.31     5. Spinal stenosis of cervical region  M48.02 MR Cervical Spine Wo Contrast    CANCELED: MR Cervical Spine Wo Contrast     Meds ordered this encounter  Medications   ALPRAZolam (XANAX) 0.25 MG tablet    Sig: 30 minutes before MRI cervica spine    Dispense:  1 tablet    Refill:  0   doxazosin (CARDURA) 2 MG tablet    Sig: Take 1 tablet (2 mg total) by mouth daily.    Dispense:  90 tablet  Refill:  3   rosuvastatin (CRESTOR) 5 MG tablet    Sig: Take 1 tablet (5 mg total) by mouth once a week.    Dispense:  13 tablet    Refill:  3    Return precautions advised.  Garret Reddish, MD

## 2021-07-01 ENCOUNTER — Encounter: Payer: Self-pay | Admitting: Physician Assistant

## 2021-07-01 ENCOUNTER — Ambulatory Visit (INDEPENDENT_AMBULATORY_CARE_PROVIDER_SITE_OTHER): Payer: Medicare HMO | Admitting: Physician Assistant

## 2021-07-01 ENCOUNTER — Other Ambulatory Visit: Payer: Self-pay | Admitting: Family Medicine

## 2021-07-01 VITALS — BP 130/88 | HR 93 | Temp 98.3°F | Ht 62.0 in | Wt 164.4 lb

## 2021-07-01 DIAGNOSIS — N390 Urinary tract infection, site not specified: Secondary | ICD-10-CM

## 2021-07-01 DIAGNOSIS — R300 Dysuria: Secondary | ICD-10-CM | POA: Diagnosis not present

## 2021-07-01 DIAGNOSIS — R1111 Vomiting without nausea: Secondary | ICD-10-CM

## 2021-07-01 DIAGNOSIS — R319 Hematuria, unspecified: Secondary | ICD-10-CM

## 2021-07-01 DIAGNOSIS — E1165 Type 2 diabetes mellitus with hyperglycemia: Secondary | ICD-10-CM

## 2021-07-01 DIAGNOSIS — R809 Proteinuria, unspecified: Secondary | ICD-10-CM | POA: Diagnosis not present

## 2021-07-01 LAB — POCT URINALYSIS DIPSTICK
Bilirubin, UA: NEGATIVE
Glucose, UA: POSITIVE — AB
Ketones, UA: NEGATIVE
Leukocytes, UA: NEGATIVE
Nitrite, UA: NEGATIVE
Protein, UA: POSITIVE — AB
Spec Grav, UA: 1.025 (ref 1.010–1.025)
Urobilinogen, UA: 1 E.U./dL
pH, UA: 5.5 (ref 5.0–8.0)

## 2021-07-01 LAB — POCT GLUCOSE (DEVICE FOR HOME USE): POC Glucose: 126 mg/dl — AB (ref 70–99)

## 2021-07-01 MED ORDER — CEPHALEXIN 500 MG PO CAPS: 500.0000 mg | ORAL_CAPSULE | Freq: Two times a day (BID) | ORAL | 0 refills | Status: DC

## 2021-07-01 NOTE — Patient Instructions (Signed)
It was great to see you!  Start cephalexin 500 mg twice daily  General instructions Make sure you: Pee until your bladder is empty. Do not hold pee for a long time. Empty your bladder after sex. Wipe from front to back after pooping if you are a female. Use each tissue one time when you wipe. Drink enough fluid to keep your pee pale yellow. Keep all follow-up visits as told by your doctor. This is important. Contact a doctor if: You do not get better after 1-2 days. Your symptoms go away and then come back. Get help right away if: You have very bad back pain. You have very bad pain in your lower belly. You have a fever. You are sick to your stomach (nauseous). You are throwing up.   I will be in touch with your follow-up urine studies.  Please follow-up in 2 weeks for a URINE STUDY --> LAB ONLY APPOINTMENT to follow-up on the blood in your urine  Take care,  Inda Coke PA-C

## 2021-07-01 NOTE — Progress Notes (Signed)
Meagan [name] is a 76 y.o. female here for a new problem of headaches.   History of Present Illness:   Chief Complaint  Patient presents with   Headache    Pt c/o headache, vomited x 2 today. Denies urinary symptoms, no fever, slight chills this morning. She has taken Tylenol 1000 mg x 2 today.    HPI  Vomiting She had  two episodes of vomiting this morning. States she had hawaiian punch before this and thinks her vomiting could be due to this. Symptoms have improved at this time. She is taking Tylenol 1000 mg twice daily. No fever, nausea, c/d, weakness.  Proteinuria Patient had blood work done in the past which showed sugar and some blood in her urine. She is here with her husband. Her husband states she had similar issue in the ED. She was found to have blood and sugar urine but urine culture showed no sign of UTI. She was taking antibiotics Macrobid due to having concern for UTI.  No fever but has some chills. Denies urinary symptoms.   Diabetes Current DM meds: Glimepiride 4 mg twice daily. Blood sugars at home are: in the 200's. Patient is compliant with medications with no adverse side effects.  Denies: hypoglycemic or hyperglycemic episodes or symptoms.   Lab Results  Component Value Date   HGBA1C 8.7 (A) 06/18/2021    Past Medical History:  Diagnosis Date   Anemia    hx    Anxiety    no meds   Arthritis    knees   Cancer ([place])    endometrial cancer   Diabetes mellitus without complication ([place])    on oral medication   GERD (gastroesophageal reflux disease)    pepcid now/famotidine   Hypertension    SVD (spontaneous vaginal delivery)    x 1   UTI (lower urinary tract infection) 11/22/2011   started abx on 11/24/11     Social History   Tobacco Use   Smoking status: Never   Smokeless tobacco: Never  Vaping Use   Vaping Use: Never used  Substance Use Topics   Alcohol use: No    Alcohol/week: 0.0 standard drinks of alcohol   Drug use: No    Past  Surgical History:  Procedure Laterality Date   CHOLECYSTECTOMY     DILATATION & CURRETTAGE/HYSTEROSCOPY WITH RESECTOCOPE  11/30/2011   Procedure: [place];  Surgeon: Peri Maris, MD;  Location: [facility];  Service: Gynecology;  Laterality: N/A;   DILATION AND CURETTAGE OF UTERUS     LYMPH NODE DISSECTION  12/14/2011   Procedure: LYMPH NODE DISSECTION;  Surgeon: Imagene Gurney A. Alycia Rossetti, MD;  Location: [facility];  Service: Gynecology;  Laterality: N/A;   ROBOTIC ASSISTED TOTAL HYSTERECTOMY WITH BILATERAL SALPINGO OOPHERECTOMY  12/14/2011   Procedure: ROBOTIC ASSISTED TOTAL HYSTERECTOMY WITH BILATERAL SALPINGO OOPHORECTOMY;  Surgeon: Imagene Gurney A. Alycia Rossetti, MD;  Location: [facility];  Service: Gynecology;  Laterality: N/A;   WISDOM TOOTH EXTRACTION      Family History  Problem Relation Age of Onset   Heart disease Mother    Heart disease Father    Diabetes Sister        decline after fall   Other Sister        old age, delcined after husband loss   Diabetes Sister        later had fall   Cancer Paternal Aunt        started in arm reportedly  Allergies  Allergen Reactions   Penicillin G Anaphylaxis   Doxycycline Hyclate Nausea Only   Shrimp [Shellfish Allergy] Nausea And Vomiting   Sinutab Sinus Max St [Phenylephrine-Acetaminophen] Swelling   Valacyclovir Other (See Comments)    Confusion    Current Medications:   Current Outpatient Medications:    acetaminophen (TYLENOL) 500 MG tablet, Take 500 mg by mouth every 6 (six) hours as needed for mild pain or moderate pain., Disp: , Rfl:    aspirin EC 81 MG tablet, Take 81 mg by mouth daily. Swallow whole., Disp: , Rfl:    doxazosin (CARDURA) 2 MG tablet, Take 1 tablet (2 mg total) by mouth daily., Disp: 90 tablet, Rfl: 3   famotidine (PEPCID) 20 MG tablet, Take 20 mg by mouth daily., Disp: , Rfl:    folic acid (FOLVITE) 1 MG tablet, Take 1 tablet (1 mg total) by mouth daily., Disp: 90 tablet, Rfl: 1    glimepiride (AMARYL) 4 MG tablet, TAKE 1 TABLET TWICE DAILY, Disp: 180 tablet, Rfl: 3   mometasone (ELOCON) 0.1 % lotion, Apply 1 application topically daily., Disp: , Rfl:    vitamin B-12 1000 MCG tablet, Take 1 tablet (1,000 mcg total) by mouth daily., Disp: 90 tablet, Rfl: 1   rosuvastatin (CRESTOR) 5 MG tablet, Take 1 tablet (5 mg total) by mouth once a week. (Patient not taking: Reported on 07/01/2021), Disp: 13 tablet, Rfl: 3   senna-docusate (SENOKOT-S) 8.6-50 MG tablet, Take 1 tablet by mouth 2 (two) times daily as needed for moderate constipation or mild constipation. (Patient not taking: Reported on 07/01/2021), Disp: , Rfl:    Review of Systems:   ROS Negative unless otherwise specified per HPI.   Vitals:   Vitals:   07/01/21 1526  BP: 130/88  Pulse: 93  Temp: 98.3 F (36.8 C)  TempSrc: Temporal  SpO2: 96%  Weight: 164 lb 6.1 oz (74.6 kg)  Height: '5\' 2"'$  (1.575 m)     Body mass index is 30.07 kg/m.  Physical Exam:   Physical Exam Vitals and nursing note reviewed.  Constitutional:      General: She is not in acute distress.    Appearance: She is well-developed. She is not ill-appearing or toxic-appearing.  Cardiovascular:     Rate and Rhythm: Normal rate and regular rhythm.     Pulses: Normal pulses.     Heart sounds: Normal heart sounds, S1 normal and S2 normal.  Pulmonary:     Effort: Pulmonary effort is normal.     Breath sounds: Normal breath sounds.  Abdominal:     General: Abdomen is flat. Bowel sounds are normal.     Palpations: Abdomen is soft.     Tenderness: There is no abdominal tenderness. There is no right CVA tenderness or left CVA tenderness.  Skin:    General: Skin is warm and dry.  Neurological:     Mental Status: She is alert.     GCS: GCS eye subscore is 4. GCS verbal subscore is 5. GCS motor subscore is 6.  Psychiatric:        Speech: Speech normal.        Behavior: Behavior normal. Behavior is cooperative.    Results for orders placed  or performed in visit on 07/01/21  POCT urinalysis dipstick  Result Value Ref Range   Color, UA yellow    Clarity, UA clear    Glucose, UA Positive (A) Negative   Bilirubin, UA negative    Ketones, UA negative  Spec Grav, UA 1.025 1.010 - 1.025   Blood, UA 2+    pH, UA 5.5 5.0 - 8.0   Protein, UA Positive (A) Negative   Urobilinogen, UA 1.0 0.2 or 1.0 E.U./dL   Nitrite, UA negative    Leukocytes, UA Negative Negative   Appearance     Odor    POCT Glucose (Device for Home Use)  Result Value Ref Range   POC Glucose 126 (A) 70 - 99 mg/dl     Assessment and Plan:   Recurrent UTI No red flags - no evidence of sepsis today -- vitals stable No pain with urination Husband is concerned that she needs abx -- will empirically treat with keflex 500 mg bid x 7 days (she took cefpodoxine in the past and tolerated well -- note PCN allergy) Urine culture pending -- will adjust treatment based on sx Recommend follow-up with PCP for discussion of prophylaxis discussion  Hematuria She had this prior and it resolved and possibly has today (POC test not always accurate with this) Will have her follow-up for urine test only two weeks after completion of abx to ensure resolution of hematuria If persists, recommend urology referral  Type 2 diabetes mellitus with hyperglycemia, without long-term current use of insulin (HCC) Has glucose in her urine - POC CBG was 126 which patient reports is "good" for her Recommend working on better glucose control Mgmt per PCP No evidence of DKA  Vomiting without nausea, unspecified vomiting type Unclear etiology -- possible GI bug or suspicious food intake She is able to drink fluids and eat Vomiting seems to have resolved Continue to monitor symptoms and if unable to keep fluids down, needs to proceed to the ER  I,Savera Zaman,acting as a scribe for [employer], PA.,have documented all relevant documentation on the behalf of Inda Coke, PA,as  directed by  Inda Coke, PA while in the presence of Inda Coke, [place].   I, Inda Coke, [place], have reviewed all documentation for this visit. The documentation on 07/01/21 for the exam, diagnosis, procedures, and orders are all accurate and complete.  Inda Coke, PA-C

## 2021-07-02 LAB — URINE CULTURE
MICRO NUMBER:: 13524896
Result:: NO GROWTH
SPECIMEN QUALITY:: ADEQUATE

## 2021-07-03 ENCOUNTER — Encounter (HOSPITAL_COMMUNITY): Payer: Self-pay

## 2021-07-03 ENCOUNTER — Other Ambulatory Visit: Payer: Self-pay

## 2021-07-03 ENCOUNTER — Emergency Department (HOSPITAL_COMMUNITY)
Admission: EM | Admit: 2021-07-03 | Discharge: 2021-07-03 | Disposition: A | Discharge status: Home / Self Care | Payer: Medicare HMO | Attending: Student | Admitting: Student

## 2021-07-03 DIAGNOSIS — Z79899 Other long term (current) drug therapy: Secondary | ICD-10-CM | POA: Insufficient documentation

## 2021-07-03 DIAGNOSIS — E119 Type 2 diabetes mellitus without complications: Secondary | ICD-10-CM | POA: Diagnosis not present

## 2021-07-03 DIAGNOSIS — Z8541 Personal history of malignant neoplasm of cervix uteri: Secondary | ICD-10-CM | POA: Insufficient documentation

## 2021-07-03 DIAGNOSIS — R8271 Bacteriuria: Secondary | ICD-10-CM | POA: Insufficient documentation

## 2021-07-03 DIAGNOSIS — R404 Transient alteration of awareness: Secondary | ICD-10-CM | POA: Diagnosis not present

## 2021-07-03 DIAGNOSIS — Z7984 Long term (current) use of oral hypoglycemic drugs: Secondary | ICD-10-CM | POA: Diagnosis not present

## 2021-07-03 DIAGNOSIS — R112 Nausea with vomiting, unspecified: Secondary | ICD-10-CM | POA: Insufficient documentation

## 2021-07-03 DIAGNOSIS — Z7982 Long term (current) use of aspirin: Secondary | ICD-10-CM | POA: Diagnosis not present

## 2021-07-03 DIAGNOSIS — I10 Essential (primary) hypertension: Secondary | ICD-10-CM | POA: Diagnosis not present

## 2021-07-03 LAB — CBC WITH DIFFERENTIAL/PLATELET
Abs Immature Granulocytes: 0.12 10*3/uL — ABNORMAL HIGH (ref 0.00–0.07)
Basophils Absolute: 0.1 10*3/uL (ref 0.0–0.1)
Basophils Relative: 1 %
Eosinophils Absolute: 0.3 10*3/uL (ref 0.0–0.5)
Eosinophils Relative: 4 %
HCT: 42.6 % (ref 36.0–46.0)
Hemoglobin: 14.2 g/dL (ref 12.0–15.0)
Immature Granulocytes: 2 %
Lymphocytes Relative: 24 %
Lymphs Abs: 1.8 10*3/uL (ref 0.7–4.0)
MCH: 26.9 pg (ref 26.0–34.0)
MCHC: 33.3 g/dL (ref 30.0–36.0)
MCV: 80.7 fL (ref 80.0–100.0)
Monocytes Absolute: 0.6 10*3/uL (ref 0.1–1.0)
Monocytes Relative: 8 %
Neutro Abs: 4.8 10*3/uL (ref 1.7–7.7)
Neutrophils Relative %: 61 %
Platelets: 247 10*3/uL (ref 150–400)
RBC: 5.28 MIL/uL — ABNORMAL HIGH (ref 3.87–5.11)
RDW: 14.1 % (ref 11.5–15.5)
WBC: 7.6 10*3/uL (ref 4.0–10.5)
nRBC: 0 % (ref 0.0–0.2)

## 2021-07-03 LAB — COMPREHENSIVE METABOLIC PANEL
ALT: 24 U/L (ref 0–44)
AST: 20 U/L (ref 15–41)
Albumin: 3.5 g/dL (ref 3.5–5.0)
Alkaline Phosphatase: 85 U/L (ref 38–126)
Anion gap: 9 (ref 5–15)
BUN: 18 mg/dL (ref 8–23)
CO2: 24 mmol/L (ref 22–32)
Calcium: 9.3 mg/dL (ref 8.9–10.3)
Chloride: 101 mmol/L (ref 98–111)
Creatinine, Ser: 0.92 mg/dL (ref 0.44–1.00)
GFR, Estimated: >60 mL/min (ref >60)
Glucose, Bld: 228 mg/dL — ABNORMAL HIGH (ref 70–99)
Potassium: 3.4 mmol/L — ABNORMAL LOW (ref 3.5–5.1)
Sodium: 134 mmol/L — ABNORMAL LOW (ref 135–145)
Total Bilirubin: 0.7 mg/dL (ref 0.3–1.2)
Total Protein: 8 g/dL (ref 6.5–8.1)

## 2021-07-03 LAB — URINALYSIS, ROUTINE W REFLEX MICROSCOPIC
Bilirubin Urine: NEGATIVE
Glucose, UA: NEGATIVE mg/dL
Ketones, ur: NEGATIVE mg/dL
Leukocytes,Ua: NEGATIVE
Nitrite: NEGATIVE
Protein, ur: 30 mg/dL — AB
Specific Gravity, Urine: 1.004 — ABNORMAL LOW (ref 1.005–1.030)
pH: 6 (ref 5.0–8.0)

## 2021-07-03 LAB — RAPID URINE DRUG SCREEN, HOSP PERFORMED
Amphetamines: NOT DETECTED
Barbiturates: NOT DETECTED
Benzodiazepines: NOT DETECTED
Cocaine: NOT DETECTED
Opiates: NOT DETECTED
Tetrahydrocannabinol: NOT DETECTED

## 2021-07-03 LAB — LACTIC ACID, PLASMA: Lactic Acid, Venous: 1.6 mmol/L (ref 0.5–1.9)

## 2021-07-03 MED ORDER — POTASSIUM CHLORIDE CRYS ER 20 MEQ PO TBCR
40.0000 meq | EXTENDED_RELEASE_TABLET | Freq: Once | ORAL | Status: AC
  Administered 2021-07-03: 40 meq via ORAL
  Filled 2021-07-03: qty 2

## 2021-07-03 MED ORDER — SODIUM CHLORIDE 0.9 % IV BOLUS
500.0000 mL | Freq: Once | INTRAVENOUS | Status: AC
  Administered 2021-07-03: 500 mL via INTRAVENOUS

## 2021-07-03 MED ORDER — ONDANSETRON HCL 4 MG/2ML IJ SOLN
4.0000 mg | Freq: Once | INTRAMUSCULAR | Status: AC
  Administered 2021-07-03: 4 mg via INTRAVENOUS
  Filled 2021-07-03: qty 2

## 2021-07-03 MED ORDER — ONDANSETRON HCL 4 MG PO TABS: 4.0000 mg | ORAL_TABLET | Freq: Four times a day (QID) | ORAL | 0 refills | Status: DC

## 2021-07-03 NOTE — Discharge Instructions (Addendum)
Your urine shows rare bacteria today but otherwise looks improved.  Your blood work today is very reassuring and your work-up does not show signs of sepsis.  Given your recurrent urinary tract infections I think you would benefit from follow-up with urology, please call to schedule close follow-up appointment.  Continue your antibiotics and follow-up with your primary care provider.  Return for new or worsening symptoms.

## 2021-07-03 NOTE — ED Provider Notes (Cosign Needed)
[facility] DEPT Provider Note   CSN: [id number] Arrival date & time: 07/03/21  0810     History  Chief Complaint  Patient presents with   Emesis   disoriented    Meagan [name] is a 76 y.o. female.  Meagan [name] is a 76 y.o. female with hx of DM, hypertension, UTIs, GERD, endometrial cancer, who presents to the emergency department with concern for.  Patient reports that she was seen at PCP 2 days ago diagnosed with UTI and started on Keflex.  She reports being seen by her primary care doctor she has had a few episodes of vomiting yesterday, denies associated abdominal pain and has not had any vomiting today.  She reports yesterday she felt a bit disoriented and has had an intermittent headache over the past few days.  Patient has been seen for similar headaches before as well.  Patient reports today she has no headache and feels normal.  She reports in March she had to be admitted to the hospital for sepsis due to UTI and is concerned she will become septic again and so wanted to get checked out.  The history is provided by the patient, the spouse and medical records.       Home Medications Prior to Admission medications   Medication Sig Start Date End Date Taking? Authorizing Provider  ondansetron (ZOFRAN) 4 MG tablet Take 1 tablet (4 mg total) by mouth every 6 (six) hours. 07/03/21  Yes Jacqlyn Larsen, PA-C  acetaminophen (TYLENOL) 500 MG tablet Take 500 mg by mouth every 6 (six) hours as needed for mild pain or moderate pain.    [provider]  aspirin EC 81 MG tablet Take 81 mg by mouth daily. Swallow whole.    [provider]  cephALEXin (KEFLEX) 500 MG capsule Take 1 capsule (500 mg total) by mouth 2 (two) times daily. 07/01/21   Inda Coke, PA  doxazosin (CARDURA) 2 MG tablet Take 1 tablet (2 mg total) by mouth daily. 06/18/21   Marin Olp, MD  famotidine (PEPCID) 20 MG tablet Take 20 mg by mouth daily. 07/01/20    [provider]  folic acid (FOLVITE) 1 MG tablet Take 1 tablet (1 mg total) by mouth daily. 04/17/21   Mercy Riding, MD  glimepiride (AMARYL) 4 MG tablet TAKE 1 TABLET TWICE DAILY 07/01/21   Marin Olp, MD  mometasone (ELOCON) 0.1 % lotion Apply 1 application topically daily. 05/28/20   [provider]  rosuvastatin (CRESTOR) 5 MG tablet Take 1 tablet (5 mg total) by mouth once a week. Patient not taking: Reported on 07/01/2021 06/18/21   Marin Olp, MD  senna-docusate (SENOKOT-S) 8.6-50 MG tablet Take 1 tablet by mouth 2 (two) times daily as needed for moderate constipation or mild constipation. Patient not taking: Reported on 07/01/2021 04/17/21   Mercy Riding, MD  vitamin B-12 1000 MCG tablet Take 1 tablet (1,000 mcg total) by mouth daily. 04/18/21   Mercy Riding, MD      Allergies    Penicillin g, Doxycycline hyclate, Shrimp [shellfish allergy], Sinutab sinus max st [phenylephrine-acetaminophen], and Valacyclovir    Review of Systems   Review of Systems  Constitutional:  Negative for chills and fever.  HENT: Negative.    Respiratory:  Negative for cough and shortness of breath.   Cardiovascular:  Negative for chest pain.  Gastrointestinal:  Positive for nausea and vomiting. Negative for abdominal pain.  Genitourinary:  Positive  for dysuria and frequency.  Neurological:  Negative for dizziness, syncope, weakness and light-headedness.    Physical Exam Updated Vital Signs BP (!) 157/78 (BP Location: Right Arm)   Pulse 88   Temp 98.1 F (36.7 C) (Oral)   Resp 18   Ht _0  (1.575 m)   Wt 74.6 kg   LMP 01/18/2001   SpO2 96%   BMI 30.07 kg/m  Physical Exam Vitals and nursing note reviewed.  Constitutional:      General: She is not in acute distress.    Appearance: Normal appearance. She is well-developed. She is not diaphoretic.  HENT:     Head: Normocephalic and atraumatic.     Mouth/Throat:     Mouth: Mucous membranes are moist.     Pharynx:  Oropharynx is clear.  Eyes:     General:        Right eye: No discharge.        Left eye: No discharge.  Cardiovascular:     Rate and Rhythm: Normal rate and regular rhythm.     Pulses: Normal pulses.     Heart sounds: Normal heart sounds.  Pulmonary:     Effort: Pulmonary effort is normal. No respiratory distress.     Breath sounds: Normal breath sounds. No wheezing or rales.     Comments: Respirations equal and unlabored, patient able to speak in full sentences, lungs clear to auscultation bilaterally  Abdominal:     General: Bowel sounds are normal. There is no distension.     Palpations: Abdomen is soft. There is no mass.     Tenderness: There is no abdominal tenderness. There is no right CVA tenderness, left CVA tenderness or guarding.     Comments: Abdomen soft, nondistended, nontender to palpation in all quadrants without guarding or peritoneal signs  Musculoskeletal:        General: No deformity.     Cervical back: Neck supple.  Skin:    General: Skin is warm and dry.     Capillary Refill: Capillary refill takes less than 2 seconds.  Neurological:     Mental Status: She is alert and oriented to person, place, and time.     Coordination: Coordination normal.     Comments: Speech is clear, able to follow commands CN III-XII intact Normal strength in upper and lower extremities bilaterally including dorsiflexion and plantar flexion, strong and equal grip strength Sensation normal to light and sharp touch Moves extremities without ataxia, coordination intact  Psychiatric:        Mood and Affect: Mood normal.        Behavior: Behavior normal.     ED Results / Procedures / Treatments   Labs (all labs ordered are listed, but only abnormal results are displayed) Labs Reviewed  URINALYSIS, ROUTINE W REFLEX MICROSCOPIC - Abnormal; Notable for the following components:      Result Value   Color, Urine STRAW (*)    Specific Gravity, Urine 1.004 (*)    Hgb urine dipstick  SMALL (*)    Protein, ur 30 (*)    Bacteria, UA RARE (*)    All other components within normal limits  COMPREHENSIVE METABOLIC PANEL - Abnormal; Notable for the following components:   Sodium 134 (*)    Potassium 3.4 (*)    Glucose, Bld 228 (*)    All other components within normal limits  CBC WITH DIFFERENTIAL/PLATELET - Abnormal; Notable for the following components:   RBC 5.28 (*)  Abs Immature Granulocytes 0.12 (*)    All other components within normal limits  URINE CULTURE  LACTIC ACID, PLASMA  RAPID URINE DRUG SCREEN, HOSP PERFORMED    EKG None  Radiology No results found.  Procedures Procedures    Medications Ordered in ED Medications  ondansetron (ZOFRAN) injection 4 mg (4 mg Intravenous Given 07/03/21 0910)  sodium chloride 0.9 % bolus 500 mL (0 mLs Intravenous Stopped 07/03/21 1108)  potassium chloride SA (KLOR-CON M) CR tablet 40 mEq (40 mEq Oral Given 07/03/21 0957)    ED Course/ Medical Decision Making/ A&P                           Medical Decision Making Amount and/or Complexity of Data Reviewed Labs: ordered.  Risk Prescription drug management.   76 y.o. female presents to the ED with complaints of recently diagnosed UTI and concern for sepsis, this involves an extensive number of treatment options, and is a complaint that carries with it a high risk of complications and morbidity.    On arrival pt is nontoxic, vitals significant for mild HTN, otherwise normal, pt does not met SIRS criteria. Exam significant for well-appearing pt, alert and oriented, no abdominal or CVA tenderness  Additional history obtained from spouse at bedside. Previous records obtained and reviewed including recent PCP visit  I ordered medication zofran and IV fluids for nausea   Lab Tests:  I Ordered, reviewed, and interpreted labs, which included: no leukocytosis, mild hypokalemia, glucose 228, electrolytes otherwise normal, normal renal function, UA with rare bacteria, no  leuks or nitrites, normal lactic   ED Course:   Pt started on ABX for UTI 2 days ago, had some emesis yesterday and brief confusion, but at baseline today, normal neuro exam. Pt does not meet any SIRS criteria, normal lactic, no concern for sepsis. PT provided reassurance and discussed labs today. Will have pt continue ABX pending culture and follow with PCP  At this time there does not appear to be any evidence of an acute emergency medical condition requiring further emergent evaluation and the patient appears stable for discharge with appropriate outpatient follow up. Diagnosis and return precautions discussed with patient who verbalizes understanding and is agreeable to discharge.  '    Portions of this note were generated with Dragon dictation software. Dictation errors may occur despite best attempts at proofreading.         Final Clinical Impression(s) / ED Diagnoses Final diagnoses:  Bacteriuria  Nausea and vomiting, unspecified vomiting type  Transient alteration of awareness    Rx / DC Orders ED Discharge Orders          Ordered    ondansetron (ZOFRAN) 4 MG tablet  Every 6 hours        07/03/21 1052              Benedetto Goad [place], [place] 07/03/21 2235

## 2021-07-03 NOTE — ED Triage Notes (Signed)
Patient states that she was diagnosed with a UTI 2 days ago and was prescribed antibiotics. Patient states that she felt disoriented  yesterday, but feels fine today. Patient also reports that she vomited 2 days ago and once yesterday.  Patient states she is afraid that she will get septic and wanted to come to the ED. Patient states she has been taking antibiotics x 2 days.

## 2021-07-04 LAB — URINE CULTURE: Culture: NO GROWTH

## 2021-07-06 ENCOUNTER — Other Ambulatory Visit: Payer: Self-pay | Admitting: Family Medicine

## 2021-07-06 ENCOUNTER — Telehealth: Payer: Self-pay | Admitting: Family Medicine

## 2021-07-06 NOTE — Telephone Encounter (Signed)
x

## 2021-07-07 ENCOUNTER — Ambulatory Visit (INDEPENDENT_AMBULATORY_CARE_PROVIDER_SITE_OTHER): Payer: Medicare HMO | Admitting: Family Medicine

## 2021-07-07 ENCOUNTER — Encounter: Payer: Self-pay | Admitting: Family Medicine

## 2021-07-07 VITALS — BP 140/80 | HR 77 | Temp 97.9°F | Ht 62.0 in | Wt 168.2 lb

## 2021-07-07 DIAGNOSIS — Z87440 Personal history of urinary (tract) infections: Secondary | ICD-10-CM | POA: Diagnosis not present

## 2021-07-07 MED ORDER — CEPHALEXIN 500 MG PO CAPS: 500.0000 mg | ORAL_CAPSULE | Freq: Every day | ORAL | 0 refills | Status: DC

## 2021-07-07 NOTE — Patient Instructions (Signed)
It was very nice to see you today!  Call [facility]   PLEASE NOTE:  If you had any lab tests please let us know if you have not heard back within a few days. You may see your results on MyChart before we have a chance to review them but we will give you a call once they are reviewed by [place]. If we ordered any referrals today, please let us know if you have not heard from their office within the next week.   Please try these tips to maintain a healthy lifestyle:  Eat most of your calories during the day when you are active. Eliminate processed foods including packaged sweets (pies, cakes, cookies), reduce intake of potatoes, white bread, white pasta, and white rice. Look for whole grain options, oat flour or almond flour.  Each meal should contain half fruits/vegetables, one quarter protein, and one quarter carbs (no bigger than a computer mouse).  Cut down on sweet beverages. This includes juice, soda, and sweet tea. Also watch fruit intake, though this is a healthier sweet option, it still contains natural sugar! Limit to 3 servings daily.  Drink at least 1 glass of water with each meal and aim for at least 8 glasses per day  Exercise at least 150 minutes every week.

## 2021-07-07 NOTE — Progress Notes (Signed)
Subjective:     Patient ID: Meagan [name], female    DOB: [birth year], 76 y.o.   MRN: [id number]  Chief Complaint  Patient presents with   Follow-up    ED follow-up from 07/03/2021 Referrals requested for urology and neurology     HPI-here w/husb UTI-started on Keflex 6/14-had vomiting so wet to er 6/16.   Cx negative.  Intermitt episodes of confusion and once told d/t UTI Per pt gets UTI freq.  Admitted 3 x for sepsis-first time,  second time, told not septic. Needs to see urol. Blood in urine last wk.  [facility] in past and bad experience.    No urinary symptoms but is disoriented and at times, UA + for UTI.  Abx will resolve confusion, but then off and recurs.  CT in 04/15/21-no etiol.   Pt wants Dr. Reece Agar.  Pt feels that as long as on abx, won't happen.   Getting MRI on Saturday of neck.  Having confusion intermitt. Can't get thoughts straight but per pt, knows where she is.  Lasts about 63mn.  Sugars not low then.  Has had mult tests.  Wants to see neuro.    Health Maintenance Due  Topic Date Due   OPHTHALMOLOGY EXAM  Never done   COLONOSCOPY (Pts 45-447yrInsurance coverage will need to be confirmed)  Never done   DEXA SCAN  Never done    Past Medical History:  Diagnosis Date   Anemia    hx    Anxiety    no meds   Arthritis    knees   Cancer ([facility] [place]   endometrial cancer   Diabetes mellitus without complication ([facility]   on oral medication   GERD (gastroesophageal reflux disease)    pepcid now/famotidine   Hypertension    SVD (spontaneous vaginal delivery)    x 1   UTI (lower urinary tract infection) 11/22/2011   started abx on 11/24/11    Past Surgical History:  Procedure Laterality Date   CHOLECYSTECTOMY     DILATATION & CURRETTAGE/HYSTEROSCOPY WITH RESECTOCOPE  11/30/2011   Procedure: DIEastover Surgeon: CyPeri MarisMD;  [facility];  Service: Gynecology;  Laterality: N/A;   DILATION AND  CURETTAGE OF UTERUS     LYMPH NODE DISSECTION  12/14/2011   Procedure: LYMPH NODE DISSECTION;  Surgeon: PaImagene Gurney. GeAlycia RossettiMD;  Location: [facility];  Service: Gynecology;  Laterality: N/A;   ROBOTIC ASSISTED TOTAL HYSTERECTOMY WITH BILATERAL SALPINGO OOPHERECTOMY  12/14/2011   Procedure: ROBOTIC ASSISTED TOTAL HYSTERECTOMY WITH BILATERAL SALPINGO OOPHORECTOMY;  Surgeon: PaImagene Gurney. GeAlycia RossettiMD;  Location: [facility];  Service: Gynecology;  Laterality: N/A;   WISDOM TOOTH EXTRACTION      Outpatient Medications Prior to Visit  Medication Sig Dispense Refill   acetaminophen (TYLENOL) 500 MG tablet Take 500 mg by mouth every 6 (six) hours as needed for mild pain or moderate pain.     aspirin EC 81 MG tablet Take 81 mg by mouth daily. Swallow whole.     doxazosin (CARDURA) 2 MG tablet TAKE 1 TABLET AT BEDTIME 90 tablet 3   famotidine (PEPCID) 20 MG tablet Take 20 mg by mouth daily.     folic acid (FOLVITE) 1 MG tablet Take 1 tablet (1 mg total) by mouth daily. 90 tablet 1   glimepiride (AMARYL) 4 MG tablet TAKE 1 TABLET TWICE DAILY 180 tablet 3   mometasone (ELOCON) 0.1 % lotion Apply 1 application topically daily.  ondansetron (ZOFRAN) 4 MG tablet Take 1 tablet (4 mg total) by mouth every 6 (six) hours. 12 tablet 0   rosuvastatin (CRESTOR) 5 MG tablet Take 1 tablet (5 mg total) by mouth once a week. 13 tablet 3   senna-docusate (SENOKOT-S) 8.6-50 MG tablet Take 1 tablet by mouth 2 (two) times daily as needed for moderate constipation or mild constipation.     triamterene-hydrochlorothiazide (MAXZIDE-25) 37.5-25 MG tablet TAKE 1 TABLET AT BEDTIME 90 tablet 3   vitamin B-12 1000 MCG tablet Take 1 tablet (1,000 mcg total) by mouth daily. 90 tablet 1   cephALEXin (KEFLEX) 500 MG capsule Take 1 capsule (500 mg total) by mouth 2 (two) times daily. 14 capsule 0   No facility-administered medications prior to visit.    Allergies  Allergen Reactions   Penicillin G Anaphylaxis   Doxycycline Hyclate Nausea Only    Shrimp [Shellfish Allergy] Nausea And Vomiting   Sinutab Sinus Max St [Phenylephrine-Acetaminophen] Swelling   Valacyclovir Other (See Comments)    Confusion   ROS neg/noncontributory except as noted HPI/below      Objective:     BP 140/80   Pulse 77   Temp 97.9 F (36.6 C) (Temporal)   Ht '5\' 2"'$  (1.575 m)   Wt 168 lb 4 oz (76.3 kg)   LMP 01/18/2001   SpO2 96%   BMI 30.77 kg/m  Wt Readings from Last 3 Encounters:  07/07/21 168 lb 4 oz (76.3 kg)  07/03/21 164 lb 6.1 oz (74.6 kg)  07/01/21 164 lb 6.1 oz (74.6 kg)    Physical Exam   Gen: WDWN NAD wf HEENT: NCAT, conjunctiva not injected, sclera nonicteric NECK:  supple, no thyromegaly, no nodes, no carotid bruits CARDIAC: RRR, S1S2+, no murmur. DP 2+B LUNGS: CTAB. No wheezes ABDOMEN:  BS+, soft, NTND, No HSM, no masses EXT:  no edema MSK: no gross abnormalities.  NEURO: A&O x3.  CN II-XII intact.  PSYCH: normal mood. Good eye contact    Reviewed hosp records, urine cx reports Assessment & Plan:   Problem List Items Addressed This Visit   None Visit Diagnoses     History of recurrent UTIs    -  Primary   Relevant Orders   Ambulatory referral to Urology       Recurrant UTI's,  h/o urosepsis.  Will do keflex '500mg'$  daily for prophy-discussed risks/side effects.  Refer urol Confusion-intemitt.  W/u in progress.  Plan was referral to neuro-advised pt will defer to Dr. Yong Channel as still waiting on studies  Meds ordered this encounter  Medications   cephALEXin (KEFLEX) 500 MG capsule    Sig: Take 1 capsule (500 mg total) by mouth daily.    Dispense:  30 capsule    Refill:  0    Wellington Hampshire, MD

## 2021-07-11 ENCOUNTER — Ambulatory Visit
Admission: RE | Admit: 2021-07-11 | Discharge: 2021-07-11 | Disposition: A | Discharge status: Home / Self Care | Payer: Medicare HMO | Source: Ambulatory Visit | Attending: Family Medicine | Admitting: Family Medicine

## 2021-07-11 DIAGNOSIS — M542 Cervicalgia: Secondary | ICD-10-CM | POA: Diagnosis not present

## 2021-07-11 DIAGNOSIS — M4802 Spinal stenosis, cervical region: Secondary | ICD-10-CM | POA: Diagnosis not present

## 2021-07-11 DIAGNOSIS — M47812 Spondylosis without myelopathy or radiculopathy, cervical region: Secondary | ICD-10-CM | POA: Diagnosis not present

## 2021-07-13 ENCOUNTER — Telehealth: Payer: Self-pay | Admitting: Family Medicine

## 2021-07-13 NOTE — Telephone Encounter (Signed)
Noted-see result note for urgent referral request

## 2021-07-13 NOTE — Telephone Encounter (Signed)
MJ with [facility] imaging is asking to make sure Dr Durene Cal sees pt's imaging results.

## 2021-07-14 ENCOUNTER — Other Ambulatory Visit: Payer: Self-pay

## 2021-07-14 DIAGNOSIS — M4802 Spinal stenosis, cervical region: Secondary | ICD-10-CM

## 2021-07-14 NOTE — Telephone Encounter (Signed)
Referral has been placed to neurosurgery.

## 2021-07-15 ENCOUNTER — Telehealth: Payer: Self-pay | Admitting: Family Medicine

## 2021-07-15 ENCOUNTER — Other Ambulatory Visit (INDEPENDENT_AMBULATORY_CARE_PROVIDER_SITE_OTHER): Payer: Medicare HMO

## 2021-07-15 DIAGNOSIS — R319 Hematuria, unspecified: Secondary | ICD-10-CM | POA: Diagnosis not present

## 2021-07-15 NOTE — Telephone Encounter (Signed)
error 

## 2021-07-15 NOTE — Addendum Note (Signed)
Addended by: Doran Clay A on: 07/15/2021 03:06 PM   Modules accepted: Orders

## 2021-07-15 NOTE — Telephone Encounter (Signed)
Called and spoke with pt and results reviewed. Pt states she does not wish to be called with results of this type in the future, she would only like this to be discussed from Dr. Yong Channel via office visit. Pt states she is upset b/c the past few times she has been seen it has not been by Dr. Yong Channel, I expressed to pt that Dr. Yong Channel has been on vacation and out of the office due to being sick. Pt still continued to express her unhappiness and frustrations and states "she guesses she will come for her lab work today" and the call was ended.

## 2021-07-15 NOTE — Telephone Encounter (Signed)
Patient's spouse called on behalf of patient in regards to urgent referral to neurosurgery yesterday. States that upon receiving a call this morning and getting the impression that this was urgent, the patient became upset due to not knowing why this was ordered. Patient and her spouse would like a breakdown of her MRI results as well as to why this referral was placed specifically. Patient can be reached at [phone].

## 2021-07-16 DIAGNOSIS — G959 Disease of spinal cord, unspecified: Secondary | ICD-10-CM | POA: Diagnosis not present

## 2021-07-16 DIAGNOSIS — Z6830 Body mass index (BMI) 30.0-30.9, adult: Secondary | ICD-10-CM | POA: Diagnosis not present

## 2021-07-16 DIAGNOSIS — M4802 Spinal stenosis, cervical region: Secondary | ICD-10-CM | POA: Diagnosis not present

## 2021-07-16 LAB — URINALYSIS, ROUTINE W REFLEX MICROSCOPIC
Bilirubin Urine: NEGATIVE
Hgb urine dipstick: NEGATIVE
Ketones, ur: NEGATIVE
Leukocytes,Ua: NEGATIVE
Nitrite: NEGATIVE
Specific Gravity, Urine: >=1.03 — AB (ref 1.000–1.030)
Total Protein, Urine: 100 — AB
Urine Glucose: >=1000 — AB
Urobilinogen, UA: 0.2 (ref 0.0–1.0)
pH: 5.5 (ref 5.0–8.0)

## 2021-07-17 ENCOUNTER — Other Ambulatory Visit: Payer: Self-pay | Admitting: Neurological Surgery

## 2021-07-17 DIAGNOSIS — G959 Disease of spinal cord, unspecified: Secondary | ICD-10-CM

## 2021-08-03 ENCOUNTER — Encounter (HOSPITAL_COMMUNITY): Payer: Self-pay

## 2021-08-03 ENCOUNTER — Emergency Department (HOSPITAL_COMMUNITY): Payer: Medicare HMO

## 2021-08-03 ENCOUNTER — Emergency Department (HOSPITAL_COMMUNITY)
Admission: EM | Admit: 2021-08-03 | Discharge: 2021-08-03 | Disposition: A | Discharge status: Home / Self Care | Payer: Medicare HMO | Attending: Emergency Medicine | Admitting: Emergency Medicine

## 2021-08-03 ENCOUNTER — Other Ambulatory Visit: Payer: Self-pay

## 2021-08-03 DIAGNOSIS — Z79899 Other long term (current) drug therapy: Secondary | ICD-10-CM | POA: Insufficient documentation

## 2021-08-03 DIAGNOSIS — R748 Abnormal levels of other serum enzymes: Secondary | ICD-10-CM | POA: Diagnosis not present

## 2021-08-03 DIAGNOSIS — R109 Unspecified abdominal pain: Secondary | ICD-10-CM | POA: Insufficient documentation

## 2021-08-03 DIAGNOSIS — K760 Fatty (change of) liver, not elsewhere classified: Secondary | ICD-10-CM | POA: Diagnosis not present

## 2021-08-03 DIAGNOSIS — M4800 Spinal stenosis, site unspecified: Secondary | ICD-10-CM

## 2021-08-03 DIAGNOSIS — M4802 Spinal stenosis, cervical region: Secondary | ICD-10-CM | POA: Insufficient documentation

## 2021-08-03 DIAGNOSIS — E871 Hypo-osmolality and hyponatremia: Secondary | ICD-10-CM | POA: Insufficient documentation

## 2021-08-03 DIAGNOSIS — M79601 Pain in right arm: Secondary | ICD-10-CM | POA: Diagnosis present

## 2021-08-03 DIAGNOSIS — R739 Hyperglycemia, unspecified: Secondary | ICD-10-CM | POA: Diagnosis not present

## 2021-08-03 DIAGNOSIS — Z7982 Long term (current) use of aspirin: Secondary | ICD-10-CM | POA: Insufficient documentation

## 2021-08-03 LAB — URINALYSIS, ROUTINE W REFLEX MICROSCOPIC
Bilirubin Urine: NEGATIVE
Glucose, UA: 150 mg/dL — AB
Ketones, ur: NEGATIVE mg/dL
Nitrite: NEGATIVE
Protein, ur: >=300 mg/dL — AB
Specific Gravity, Urine: 1.015 (ref 1.005–1.030)
pH: 5 (ref 5.0–8.0)

## 2021-08-03 LAB — CBC WITH DIFFERENTIAL/PLATELET
Abs Immature Granulocytes: 0.03 10*3/uL (ref 0.00–0.07)
Basophils Absolute: 0.1 10*3/uL (ref 0.0–0.1)
Basophils Relative: 1 %
Eosinophils Absolute: 0.2 10*3/uL (ref 0.0–0.5)
Eosinophils Relative: 3 %
HCT: 44.9 % (ref 36.0–46.0)
Hemoglobin: 15.1 g/dL — ABNORMAL HIGH (ref 12.0–15.0)
Immature Granulocytes: 0 %
Lymphocytes Relative: 21 %
Lymphs Abs: 1.9 10*3/uL (ref 0.7–4.0)
MCH: 27.2 pg (ref 26.0–34.0)
MCHC: 33.6 g/dL (ref 30.0–36.0)
MCV: 80.8 fL (ref 80.0–100.0)
Monocytes Absolute: 0.5 10*3/uL (ref 0.1–1.0)
Monocytes Relative: 6 %
Neutro Abs: 6.1 10*3/uL (ref 1.7–7.7)
Neutrophils Relative %: 69 %
Platelets: 239 10*3/uL (ref 150–400)
RBC: 5.56 MIL/uL — ABNORMAL HIGH (ref 3.87–5.11)
RDW: 14 % (ref 11.5–15.5)
WBC: 8.7 10*3/uL (ref 4.0–10.5)
nRBC: 0 % (ref 0.0–0.2)

## 2021-08-03 LAB — COMPREHENSIVE METABOLIC PANEL
ALT: 26 U/L (ref 0–44)
AST: 22 U/L (ref 15–41)
Albumin: 3.5 g/dL (ref 3.5–5.0)
Alkaline Phosphatase: 92 U/L (ref 38–126)
Anion gap: 11 (ref 5–15)
BUN: 18 mg/dL (ref 8–23)
CO2: 23 mmol/L (ref 22–32)
Calcium: 9.2 mg/dL (ref 8.9–10.3)
Chloride: 96 mmol/L — ABNORMAL LOW (ref 98–111)
Creatinine, Ser: 0.81 mg/dL (ref 0.44–1.00)
GFR, Estimated: >60 mL/min (ref >60)
Glucose, Bld: 238 mg/dL — ABNORMAL HIGH (ref 70–99)
Potassium: 3.8 mmol/L (ref 3.5–5.1)
Sodium: 130 mmol/L — ABNORMAL LOW (ref 135–145)
Total Bilirubin: 0.9 mg/dL (ref 0.3–1.2)
Total Protein: 7.9 g/dL (ref 6.5–8.1)

## 2021-08-03 LAB — LIPASE, BLOOD: Lipase: 66 U/L — ABNORMAL HIGH (ref 11–51)

## 2021-08-03 MED ORDER — KETOROLAC TROMETHAMINE 30 MG/ML IJ SOLN
15.0000 mg | Freq: Once | INTRAMUSCULAR | Status: AC
  Administered 2021-08-03: 15 mg via INTRAVENOUS
  Filled 2021-08-03: qty 1

## 2021-08-03 MED ORDER — ETODOLAC 300 MG PO CAPS: 300.0000 mg | ORAL_CAPSULE | Freq: Three times a day (TID) | ORAL | 0 refills | Status: DC

## 2021-08-03 MED ORDER — CYCLOBENZAPRINE HCL 10 MG PO TABS: 10.0000 mg | ORAL_TABLET | Freq: Two times a day (BID) | ORAL | 0 refills | Status: DC | PRN

## 2021-08-03 MED ORDER — POLYETHYLENE GLYCOL 3350 17 G PO PACK: 17.0000 g | PACK | Freq: Every day | ORAL | 0 refills | Status: DC

## 2021-08-03 MED ORDER — IOHEXOL 300 MG/ML  SOLN
100.0000 mL | Freq: Once | INTRAMUSCULAR | Status: AC | PRN
  Administered 2021-08-03: 100 mL via INTRAVENOUS

## 2021-08-03 MED ORDER — SODIUM CHLORIDE 0.9 % IV SOLN: INTRAVENOUS | Status: DC

## 2021-08-03 NOTE — Discharge Instructions (Signed)
Laboratory tests and CT scans were reassuring.  No signs of any acute inflammation, infection or abnormalities on the CT scan to explain the left-sided flank pain.  Take medications as prescribed to help with the pain and discomfort.  Follow-up with your primary care doctor or spine doctor for further evaluation

## 2021-08-03 NOTE — ED Triage Notes (Signed)
Patient c/o left flank pain x 4 days. Patient states she has a known kidney stone. No urinary issues.  Patient also c/o intermittent right arm pain from shoulder to the elbow > 3 weeks.

## 2021-08-03 NOTE — ED Provider Notes (Signed)
[facility] DEPT Provider Note   CSN: [id number] Arrival date & time: 08/03/21  0747     History  Chief Complaint  Patient presents with   Flank Pain   Arm Pain    Meagan [name] is a 76 y.o. female.   Flank Pain  Arm Pain   Patient presents to the ED for evaluation of flank pain.  Patient states she has been having sharp pain in the flank area for the last few days.  She states the pain has been rather constant.  She has been trying over-the-counter medications without relief.  She denies any urinary symptoms.  No vomiting but some nausea.  No diarrhea or constipation.  Patient states pain became more intense so she came to the ED.  She does have history of kidney stones that she was told about on a prior imaging test.  Patient also states she has been having some intermittent pain in her right arm.  That is not an acute issue for her right now.  She did have recent imaging test as an outpatient.  She had an MRI of her cervical spine on June 26 of this year that showed spinal stenosis.  Patient states that does not the reason she came to the ED.  She denies any acute weakness or numbness.    Home Medications Prior to Admission medications   Medication Sig Start Date End Date Taking? Authorizing Provider  cyclobenzaprine (FLEXERIL) 10 MG tablet Take 1 tablet (10 mg total) by mouth 2 (two) times daily as needed for muscle spasms. 08/03/21  Yes Dorie Rank, MD  etodolac (LODINE) 300 MG capsule Take 1 capsule (300 mg total) by mouth every 8 (eight) hours. 08/03/21  Yes Dorie Rank, MD  polyethylene glycol (MIRALAX) 17 g packet Take 17 g by mouth daily. 08/03/21  Yes Dorie Rank, MD  acetaminophen (TYLENOL) 500 MG tablet Take 500 mg by mouth every 6 (six) hours as needed for mild pain or moderate pain.    [provider]  aspirin EC 81 MG tablet Take 81 mg by mouth daily. Swallow whole.    [provider]  cephALEXin (KEFLEX) 500 MG capsule  Take 1 capsule (500 mg total) by mouth daily. 07/07/21   Tawnya Crook, MD  doxazosin (CARDURA) 2 MG tablet TAKE 1 TABLET AT BEDTIME 07/06/21   Marin Olp, MD  famotidine (PEPCID) 20 MG tablet Take 20 mg by mouth daily. 07/01/20   [provider]  folic acid (FOLVITE) 1 MG tablet Take 1 tablet (1 mg total) by mouth daily. 04/17/21   Mercy Riding, MD  glimepiride (AMARYL) 4 MG tablet TAKE 1 TABLET TWICE DAILY 07/01/21   Marin Olp, MD  mometasone (ELOCON) 0.1 % lotion Apply 1 application topically daily. 05/28/20   [provider]  ondansetron (ZOFRAN) 4 MG tablet Take 1 tablet (4 mg total) by mouth every 6 (six) hours. 07/03/21   Jacqlyn Larsen, PA-C  rosuvastatin (CRESTOR) 5 MG tablet Take 1 tablet (5 mg total) by mouth once a week. 06/18/21   Marin Olp, MD  senna-docusate (SENOKOT-S) 8.6-50 MG tablet Take 1 tablet by mouth 2 (two) times daily as needed for moderate constipation or mild constipation. 04/17/21   Mercy Riding, MD  triamterene-hydrochlorothiazide (MAXZIDE-25) 37.5-25 MG tablet TAKE 1 TABLET AT BEDTIME 07/06/21   Marin Olp, MD  vitamin B-12 1000 MCG tablet Take 1 tablet (1,000 mcg total) by mouth daily. 04/18/21  Mercy Riding, MD      Allergies    Penicillin g, Doxycycline hyclate, Shrimp [shellfish allergy], Sinutab sinus max st [phenylephrine-acetaminophen], and Valacyclovir    Review of Systems   Review of Systems  Genitourinary:  Positive for flank pain.    Physical Exam Updated Vital Signs BP (!) 165/83   Pulse 88   Temp 98.5 F (36.9 C) (Oral)   Resp 15   Ht 1.575 m ('5\' 2"'$ )   Wt 73.9 kg   LMP 01/18/2001   SpO2 95%   BMI 29.81 kg/m  Physical Exam Vitals and nursing note reviewed.  Constitutional:      General: She is not in acute distress.    Appearance: She is well-developed.  HENT:     Head: Normocephalic and atraumatic.     Right Ear: External ear normal.     Left Ear: External ear normal.  Eyes:     General:  No scleral icterus.       Right eye: No discharge.        Left eye: No discharge.     Conjunctiva/sclera: Conjunctivae normal.  Neck:     Trachea: No tracheal deviation.  Cardiovascular:     Rate and Rhythm: Normal rate and regular rhythm.  Pulmonary:     Effort: Pulmonary effort is normal. No respiratory distress.     Breath sounds: Normal breath sounds. No stridor. No wheezing or rales.  Abdominal:     General: Bowel sounds are normal. There is no distension.     Palpations: Abdomen is soft.     Tenderness: There is no abdominal tenderness. There is left CVA tenderness. There is no guarding or rebound.  Musculoskeletal:        General: No tenderness or deformity.     Cervical back: Neck supple.  Skin:    General: Skin is warm and dry.     Findings: No rash.  Neurological:     General: No focal deficit present.     Mental Status: She is alert.     Cranial Nerves: No cranial nerve deficit (no facial droop, extraocular movements intact, no slurred speech).     Sensory: No sensory deficit.     Motor: No abnormal muscle tone or seizure activity.     Coordination: Coordination normal.  Psychiatric:        Mood and Affect: Mood normal.     ED Results / Procedures / Treatments   Labs (all labs ordered are listed, but only abnormal results are displayed) Labs Reviewed  URINALYSIS, ROUTINE W REFLEX MICROSCOPIC - Abnormal; Notable for the following components:      Result Value   Glucose, UA 150 (*)    Hgb urine dipstick SMALL (*)    Protein, ur >=300 (*)    Leukocytes,Ua TRACE (*)    Bacteria, UA RARE (*)    All other components within normal limits  CBC WITH DIFFERENTIAL/PLATELET - Abnormal; Notable for the following components:   RBC 5.56 (*)    Hemoglobin 15.1 (*)    All other components within normal limits  COMPREHENSIVE METABOLIC PANEL - Abnormal; Notable for the following components:   Sodium 130 (*)    Chloride 96 (*)    Glucose, Bld 238 (*)    All other  components within normal limits  LIPASE, BLOOD - Abnormal; Notable for the following components:   Lipase 66 (*)    All other components within normal limits    EKG None  Radiology  CT ABDOMEN PELVIS W CONTRAST  Result Date: 08/03/2021 CLINICAL DATA:  Abdominal pain, acute, nonlocalized EXAM: CT ABDOMEN AND PELVIS WITH CONTRAST TECHNIQUE: Multidetector CT imaging of the abdomen and pelvis was performed using the standard protocol following bolus administration of intravenous contrast. RADIATION DOSE REDUCTION: This exam was performed according to the departmental dose-optimization program which includes automated exposure control, adjustment of the mA and/or kV according to patient size and/or use of iterative reconstruction technique. CONTRAST:  165m OMNIPAQUE IOHEXOL 300 MG/ML  SOLN COMPARISON:  April 15, 2021 FINDINGS: Lower chest: No acute abnormality. Hepatobiliary: No focal liver abnormality is seen. Mild hepatic steatosis. Status post cholecystectomy. No biliary dilatation. Pancreas: Unremarkable. No pancreatic ductal dilatation or surrounding inflammatory changes. Spleen: Normal in size without focal abnormality. Adrenals/Urinary Tract: Adrenal glands are unremarkable. Kidneys are normal, without renal calculi, focal lesion, or hydronephrosis. Bladder is unremarkable. Stomach/Bowel: Stomach is within normal limits. Small hiatal hernia. Appendix appears normal. No evidence of bowel wall thickening, distention, or inflammatory changes. Moderate stool burden in the ascending and transverse colon. Vascular/Lymphatic: Mild-to-moderate atheromatous calcifications of the abdominal aorta extending into the iliac arteries. No enlarged abdominal or pelvic lymph nodes. Reproductive: Status post hysterectomy. No adnexal masses. Other: No abdominal wall hernia or abnormality. Stable calcifications at the right upper-mid anterior abdominal wall. No abdominopelvic ascites. Musculoskeletal: Mild-to-moderate  thoracolumbar spondylosis. Mild retrolisthesis at L1-L2 and anterolisthesis at L4-L5, stable. IMPRESSION: 1. No free air, fluid, inflammatory changes, mass or significant lymphadenopathy. No bowel obstruction. 2.  Mild hepatic steatosis. 3.  Small hiatal hernia. Electronically Signed   By: AFrazier RichardsM.D.   On: 08/03/2021 13:08    Procedures Procedures    Medications Ordered in ED Medications  0.9 %  sodium chloride infusion ( Intravenous New Bag/Given 08/03/21 0841)  ketorolac (TORADOL) 30 MG/ML injection 15 mg (15 mg Intravenous Given 08/03/21 0835)  iohexol (OMNIPAQUE) 300 MG/ML solution 100 mL (100 mLs Intravenous Contrast Given 08/03/21 1241)    ED Course/ Medical Decision Making/ A&P Clinical Course as of 08/03/21 1340  Mon Aug 03, 2021  0936 CBC with Diff(!) Normal [JK]  1204 Lipase, blood(!) Lipase slightly elevated [JK]  1204 Comprehensive metabolic panel(!) Hyponatremia and hyperglycemia noted [JK]    Clinical Course User Index [JK] KDorie Rank MD                           Medical Decision Making Problems Addressed: Flank pain: acute illness or injury that poses a threat to life or bodily functions  Amount and/or Complexity of Data Reviewed Labs: ordered. Decision-making details documented in ED Course. Radiology: ordered.  Risk Prescription drug management.   Patient presents ED with complaints of left-sided flank pain.  Consider the possibility of diverticulitis colitis ureterolithiasis.  Laboratory tests were reassuring.  Sodium slightly decreased compared to previous values but doubt contributing to her symptoms.  Lipase was slightly elevated however CT scan does not show any acute inflammation.  I doubt this is related to her acute pain.  CT scan does not show any acute findings.  Symptoms may be more musculoskeletal in nature.  She does have known spinal stenosis.  Evaluation and diagnostic testing in the emergency department does not suggest an emergent  condition requiring admission or immediate intervention beyond what has been performed at this time.  The patient is safe for discharge and has been instructed to return immediately for worsening symptoms, change in symptoms or any other concerns.  Final Clinical Impression(s) / ED Diagnoses Final diagnoses:  Flank pain  Hyponatremia  Spinal stenosis, unspecified spinal region    Rx / DC Orders ED Discharge Orders          Ordered    polyethylene glycol (MIRALAX) 17 g packet  Daily        08/03/21 1336    cyclobenzaprine (FLEXERIL) 10 MG tablet  2 times daily PRN        08/03/21 1336    etodolac (LODINE) 300 MG capsule  Every 8 hours       Note to Pharmacy: As needed for pain   08/03/21 1336              Dorie Rank, MD 08/03/21 1340

## 2021-08-03 NOTE — ED Notes (Signed)
Pt ambulatory without assistance.  

## 2021-08-13 ENCOUNTER — Ambulatory Visit
Admission: RE | Admit: 2021-08-13 | Discharge: 2021-08-13 | Disposition: A | Discharge status: Home / Self Care | Payer: Medicare HMO | Source: Ambulatory Visit | Attending: Neurological Surgery | Admitting: Neurological Surgery

## 2021-08-13 DIAGNOSIS — E041 Nontoxic single thyroid nodule: Secondary | ICD-10-CM | POA: Diagnosis not present

## 2021-08-13 DIAGNOSIS — Z8669 Personal history of other diseases of the nervous system and sense organs: Secondary | ICD-10-CM | POA: Diagnosis not present

## 2021-08-13 DIAGNOSIS — M4802 Spinal stenosis, cervical region: Secondary | ICD-10-CM | POA: Diagnosis not present

## 2021-08-13 DIAGNOSIS — M47812 Spondylosis without myelopathy or radiculopathy, cervical region: Secondary | ICD-10-CM | POA: Diagnosis not present

## 2021-08-13 DIAGNOSIS — G959 Disease of spinal cord, unspecified: Secondary | ICD-10-CM

## 2021-08-17 DIAGNOSIS — G959 Disease of spinal cord, unspecified: Secondary | ICD-10-CM | POA: Diagnosis not present

## 2021-08-17 DIAGNOSIS — Z6830 Body mass index (BMI) 30.0-30.9, adult: Secondary | ICD-10-CM | POA: Diagnosis not present

## 2021-08-25 ENCOUNTER — Ambulatory Visit (INDEPENDENT_AMBULATORY_CARE_PROVIDER_SITE_OTHER): Payer: Medicare HMO | Admitting: Family Medicine

## 2021-08-25 ENCOUNTER — Encounter: Payer: Self-pay | Admitting: Family Medicine

## 2021-08-25 VITALS — BP 138/78 | HR 98 | Temp 98.3°F | Ht 62.0 in | Wt 166.6 lb

## 2021-08-25 DIAGNOSIS — I10 Essential (primary) hypertension: Secondary | ICD-10-CM | POA: Diagnosis not present

## 2021-08-25 DIAGNOSIS — R300 Dysuria: Secondary | ICD-10-CM | POA: Diagnosis not present

## 2021-08-25 DIAGNOSIS — E1169 Type 2 diabetes mellitus with other specified complication: Secondary | ICD-10-CM

## 2021-08-25 DIAGNOSIS — N3001 Acute cystitis with hematuria: Secondary | ICD-10-CM

## 2021-08-25 DIAGNOSIS — E785 Hyperlipidemia, unspecified: Secondary | ICD-10-CM

## 2021-08-25 DIAGNOSIS — E1165 Type 2 diabetes mellitus with hyperglycemia: Secondary | ICD-10-CM | POA: Diagnosis not present

## 2021-08-25 LAB — POC URINALSYSI DIPSTICK (AUTOMATED)
Bilirubin, UA: NEGATIVE
Blood, UA: POSITIVE
Glucose, UA: NEGATIVE
Ketones, UA: NEGATIVE
Nitrite, UA: POSITIVE
Protein, UA: POSITIVE — AB
Spec Grav, UA: >=1.03 — AB (ref 1.010–1.025)
Urobilinogen, UA: 0.2 E.U./dL
pH, UA: 5.5 (ref 5.0–8.0)

## 2021-08-25 MED ORDER — NITROFURANTOIN MONOHYD MACRO 100 MG PO CAPS: 100.0000 mg | ORAL_CAPSULE | Freq: Two times a day (BID) | ORAL | 0 refills | Status: DC

## 2021-08-25 NOTE — Patient Instructions (Addendum)
Call [facility]- had been referred in June by Dr. Cherlynn Kaiser Address: [place] [address] Phone: [phone]  Declines colonoscopy  Start new antibiotic for 10 days- call urology as above for follow up   Recommended follow up: Return in about 2 months (around 10/25/2021) for followup or sooner if needed.Schedule b4 you leave.

## 2021-08-25 NOTE — Progress Notes (Signed)
Phone [phone] In person visit   Subjective:   Meagan [name] is a 76 y.o. year old very pleasant female patient who presents for/with See problem oriented charting Chief Complaint  Patient presents with   Dysuria    Pt c/o dysuria and urgency along with abdominal pain that started 2 days ago.   Past Medical History-  Patient Active Problem List   Diagnosis Date Noted   E coli bacteremia 04/17/2021    Priority: High   History of TIA (transient ischemic attack) 04/15/2021    Priority: High   Diabetes mellitus, type 2 ([place]) 02/12/2015    Priority: High   Normocytic anemia 04/16/2021    Priority: Medium    Chronic kidney disease, stage 3a ([place]) 04/15/2021    Priority: Medium    Acid reflux 02/12/2015    Priority: Medium    Hyperlipidemia associated with type 2 diabetes mellitus ([place]) 02/12/2015    Priority: Medium    Primary hypertension 12/08/2011    Priority: Medium    GERD (gastroesophageal reflux disease) 03/03/2021    Priority: Low   Dermatitis, eczematoid 02/12/2015    Priority: Low   History of endometrial cancer 12/08/2011    Priority: Low    Medications- reviewed and updated Current Outpatient Medications  Medication Sig Dispense Refill   acetaminophen (TYLENOL) 500 MG tablet Take 500 mg by mouth every 6 (six) hours as needed for mild pain or moderate pain.     aspirin EC 81 MG tablet Take 81 mg by mouth daily. Swallow whole.     cyclobenzaprine (FLEXERIL) 10 MG tablet Take 1 tablet (10 mg total) by mouth 2 (two) times daily as needed for muscle spasms. 20 tablet 0   doxazosin (CARDURA) 2 MG tablet TAKE 1 TABLET AT BEDTIME 90 tablet 3   etodolac (LODINE) 300 MG capsule Take 1 capsule (300 mg total) by mouth every 8 (eight) hours. 21 capsule 0   famotidine (PEPCID) 20 MG tablet Take 20 mg by mouth daily.     folic acid (FOLVITE) 1 MG tablet Take 1 tablet (1 mg total) by mouth daily. 90 tablet 1   glimepiride (AMARYL) 4 MG tablet TAKE 1 TABLET TWICE DAILY  180 tablet 3   mometasone (ELOCON) 0.1 % lotion Apply 1 application topically daily.     nitrofurantoin, macrocrystal-monohydrate, (MACROBID) 100 MG capsule Take 1 capsule (100 mg total) by mouth 2 (two) times daily. 20 capsule 0   ondansetron (ZOFRAN) 4 MG tablet Take 1 tablet (4 mg total) by mouth every 6 (six) hours. 12 tablet 0   polyethylene glycol (MIRALAX) 17 g packet Take 17 g by mouth daily. 14 each 0   rosuvastatin (CRESTOR) 5 MG tablet Take 1 tablet (5 mg total) by mouth once a week. 13 tablet 3   senna-docusate (SENOKOT-S) 8.6-50 MG tablet Take 1 tablet by mouth 2 (two) times daily as needed for moderate constipation or mild constipation.     triamterene-hydrochlorothiazide (MAXZIDE-25) 37.5-25 MG tablet TAKE 1 TABLET AT BEDTIME 90 tablet 3   vitamin B-12 1000 MCG tablet Take 1 tablet (1,000 mcg total) by mouth daily. 90 tablet 1   No current facility-administered medications for this visit.     Objective:  BP 138/78   Pulse 98   Temp 98.3 F (36.8 C)   Ht '5\' 2"'$  (1.575 m)   Wt 166 lb 9.6 oz (75.6 kg)   LMP 01/18/2001   SpO2 96%   BMI 30.47 kg/m  Gen: NAD, resting comfortably  CV: RRR no murmurs rubs or gallops Lungs: CTAB no crackles, wheeze, rhonchi Abdomen: soft/nontender/nondistended/normal bowel sounds. No rebound or guarding. No cva tenderness Ext: no edema Results for orders placed or performed in visit on 08/25/21 (from the past 24 hour(s))  POCT Urinalysis Dipstick (Automated)     Status: Abnormal   Collection Time: 08/25/21  2:10 PM  Result Value Ref Range   Color, UA yellow    Clarity, UA clear    Glucose, UA Negative Negative   Bilirubin, UA neg    Ketones, UA neg    Spec Grav, UA >=1.030 (A) 1.010 - 1.025   Blood, UA positive    pH, UA 5.5 5.0 - 8.0   Protein, UA Positive (A) Negative   Urobilinogen, UA 0.2 0.2 or 1.0 E.U./dL   Nitrite, UA positive    Leukocytes, UA Moderate (2+) (A) Negative       Assessment and Plan   #Neurosurgery consult-  currently not planned to have surgery but following up closely with neurosurgery- 3 month follow up planned- see last note. Surgical options are limited  #Concern for UTI S: Patients symptoms started 2 days ago.  She has noted lower abdominal pain as well on left side and suprapubic. Had been on keflex per DR. Cherlynn Kaiser for UTI prophylaxis but she ran out 2 weeks ago- had also been referred to urology Complains of dysuria: Yes; polyuria: yes; nocturia: yes; urgency: Yes.  Symptoms are worsening. Last UTI in march ROS- no fever, chills, nausea, vomiting, flank pain. No blood in urine.  A/P: UA are concerning for Likely UTI in addition to symptoms. Will get culture. Empiric treatment with: nitrofurantoin- listed as anaphylaxis for penicillin but tolerated keflex but id still prefer alternate. Gave follow up info for urology- called 6 months ago- referral due to concern for urosepsis history by Dr. Cherlynn Kaiser - last clear UTI I see was march Patient to follow up if new or worsening symptoms or failure to improve.  -recheck urine next visit due to possible hematuria but could be caused by UTI  #HM- declines cologuard and colonoscopy despite risks  # Diabetes S: Medication: Glimepiride 4 mg twice daily-has declined all other medication and wants CBGs- not checking Exercise and diet- cant exercise due to neck issues  A/P: poor control. still refuses to advance medication or try any alternates- only agrees to try tow work on lifestyle. Continue current meds   #hypertension S: medication:  doxazosin 2 mg, triamterene hctz 37.5-25 mg- encouraged to stay hydrated due to previously on triamterene hctz but with hospitalization thought dehydrating potentially and stopped in 2023 BP Readings from Last 3 Encounters:  08/25/21 138/78  08/03/21 (!) 179/91  07/07/21 140/80   A/P: Initial blood pressure elevated but improved on repeat to high acceptable -continue current medication   #hyperlipidemia S:  Medication:none, patient not interested previously-last visit with potential TIA we discussed LDL goal under 70 and starting rosuvastatin 5 mg even just once a week   A/P: poor control but refuses medication- continue to monitor  Recommended follow up: Return in about 2 months (around 10/25/2021) for followup or sooner if needed.Schedule b4 you leave.  Lab/Order associations:   ICD-10-CM   1. Acute cystitis with hematuria  N30.01     2. Dysuria  R30.0 POCT Urinalysis Dipstick (Automated)    Urine Culture    Urine Culture    3. Type 2 diabetes mellitus with hyperglycemia, without long-term current use of insulin (HCC)  E11.65  4. Primary hypertension  I10     5. Hyperlipidemia associated with type 2 diabetes mellitus (HCC)  E11.69    E78.5       Meds ordered this encounter  Medications   nitrofurantoin, macrocrystal-monohydrate, (MACROBID) 100 MG capsule    Sig: Take 1 capsule (100 mg total) by mouth 2 (two) times daily.    Dispense:  20 capsule    Refill:  0    Return precautions advised.  Garret Reddish, MD

## 2021-08-27 LAB — URINE CULTURE
MICRO NUMBER:: 13750594
SPECIMEN QUALITY:: ADEQUATE

## 2021-08-29 ENCOUNTER — Encounter: Payer: Self-pay | Admitting: Family Medicine

## 2021-09-10 ENCOUNTER — Ambulatory Visit (INDEPENDENT_AMBULATORY_CARE_PROVIDER_SITE_OTHER): Payer: Medicare HMO | Admitting: Family Medicine

## 2021-09-10 ENCOUNTER — Encounter: Payer: Self-pay | Admitting: Family Medicine

## 2021-09-10 VITALS — BP 136/70 | HR 83 | Temp 97.8°F | Ht 62.0 in | Wt 169.4 lb

## 2021-09-10 DIAGNOSIS — E1165 Type 2 diabetes mellitus with hyperglycemia: Secondary | ICD-10-CM | POA: Diagnosis not present

## 2021-09-10 DIAGNOSIS — I10 Essential (primary) hypertension: Secondary | ICD-10-CM | POA: Diagnosis not present

## 2021-09-10 DIAGNOSIS — R300 Dysuria: Secondary | ICD-10-CM | POA: Diagnosis not present

## 2021-09-10 LAB — POCT URINALYSIS DIPSTICK
Bilirubin, UA: NEGATIVE
Blood, UA: NEGATIVE
Glucose, UA: POSITIVE — AB
Ketones, UA: NEGATIVE
Leukocytes, UA: NEGATIVE
Nitrite, UA: POSITIVE
Protein, UA: POSITIVE — AB
Spec Grav, UA: 1.015 (ref 1.010–1.025)
Urobilinogen, UA: 0.2 E.U./dL
pH, UA: 7 (ref 5.0–8.0)

## 2021-09-10 MED ORDER — NITROFURANTOIN MONOHYD MACRO 100 MG PO CAPS: 100.0000 mg | ORAL_CAPSULE | Freq: Two times a day (BID) | ORAL | 0 refills | Status: DC

## 2021-09-10 NOTE — Progress Notes (Signed)
Phone [phone] In person visit   Subjective:   Meagan [name] is a 76 y.o. year old very pleasant female patient who presents for/with See problem oriented charting Chief Complaint  Patient presents with   abnormal urine color    Pt states urine is orange in color and smells like ammonia and dysuria.   Past Medical History-  Patient Active Problem List   Diagnosis Date Noted   E coli bacteremia 04/17/2021    Priority: High   History of TIA (transient ischemic attack) 04/15/2021    Priority: High   Diabetes mellitus, type 2 ([place]) 02/12/2015    Priority: High   Normocytic anemia 04/16/2021    Priority: Medium    Chronic kidney disease, stage 3a ([place]) 04/15/2021    Priority: Medium    Acid reflux 02/12/2015    Priority: Medium    Hyperlipidemia associated with type 2 diabetes mellitus ([place]) 02/12/2015    Priority: Medium    Primary hypertension 12/08/2011    Priority: Medium    GERD (gastroesophageal reflux disease) 03/03/2021    Priority: Low   Dermatitis, eczematoid 02/12/2015    Priority: Low   History of endometrial cancer 12/08/2011    Priority: Low    Medications- reviewed and updated Current Outpatient Medications  Medication Sig Dispense Refill   acetaminophen (TYLENOL) 500 MG tablet Take 500 mg by mouth every 6 (six) hours as needed for mild pain or moderate pain.     aspirin EC 81 MG tablet Take 81 mg by mouth daily. Swallow whole.     cyclobenzaprine (FLEXERIL) 10 MG tablet Take 1 tablet (10 mg total) by mouth 2 (two) times daily as needed for muscle spasms. 20 tablet 0   doxazosin (CARDURA) 2 MG tablet TAKE 1 TABLET AT BEDTIME 90 tablet 3   etodolac (LODINE) 300 MG capsule Take 1 capsule (300 mg total) by mouth every 8 (eight) hours. 21 capsule 0   famotidine (PEPCID) 20 MG tablet Take 20 mg by mouth daily.     folic acid (FOLVITE) 1 MG tablet Take 1 tablet (1 mg total) by mouth daily. 90 tablet 1   glimepiride (AMARYL) 4 MG tablet TAKE 1 TABLET TWICE  DAILY 180 tablet 3   mometasone (ELOCON) 0.1 % lotion Apply 1 application topically daily.     nitrofurantoin, macrocrystal-monohydrate, (MACROBID) 100 MG capsule Take 1 capsule (100 mg total) by mouth 2 (two) times daily. 28 capsule 0   ondansetron (ZOFRAN) 4 MG tablet Take 1 tablet (4 mg total) by mouth every 6 (six) hours. 12 tablet 0   polyethylene glycol (MIRALAX) 17 g packet Take 17 g by mouth daily. 14 each 0   rosuvastatin (CRESTOR) 5 MG tablet Take 1 tablet (5 mg total) by mouth once a week. 13 tablet 3   senna-docusate (SENOKOT-S) 8.6-50 MG tablet Take 1 tablet by mouth 2 (two) times daily as needed for moderate constipation or mild constipation.     triamterene-hydrochlorothiazide (MAXZIDE-25) 37.5-25 MG tablet TAKE 1 TABLET AT BEDTIME 90 tablet 3   vitamin B-12 1000 MCG tablet Take 1 tablet (1,000 mcg total) by mouth daily. 90 tablet 1   No current facility-administered medications for this visit.     Objective:  BP 136/70   Pulse 83   Temp 97.8 F (36.6 C)   Ht '5\' 2"'$  (1.575 m)   Wt 169 lb 6.4 oz (76.8 kg)   LMP 01/18/2001   SpO2 98%   BMI 30.98 kg/m  Gen: NAD, resting  comfortably CV: RRR no murmurs rubs or gallops Lungs: CTAB no crackles, wheeze, rhonchi Abdomen: soft/nontender including surapubic/nondistended/normal bowel sounds. No rebound or guarding. No cva tenderness Ext: no edema    Assessment and Plan   #Concern for UTI S: Patients symptoms started about a week ago- finished medicine for last UTI over a week ago  with E. Coli pan sensitive - nitrofurantoin for 10 days '100mg'$  BID. Pcn allergic Complains of dysuria: yes; polyuria: yes; nocturia: more than usual; urgency: yes.  Symptoms are getting mildly worse.  ROS- no fever, chills, nausea, vomiting, flank pain. No blood in urine.  A/P: UA with 3+ glucose, 2+ protein, nitrites. Likely UTI. Will get culture. Empiric treatment with: nitrofurantoin- but extend to 14 days from 10 days (symptoms did resolve on this  until later restarted after stopping). Bactrim another option. PCN allergy anaphylaxis limits options- hesitant about cipro Patient to follow up if new or worsening symptoms or failure to improve.   # Diabetes S: Medication: Glimepiride 4 mg twice daily CBGs- had been able to get sugars down to 100 if eating meat/veggies mainly- but really enjoys sweets and has opted to not check recently Exercise and diet- poor choices- cake, candy bars, cream cheese pies,  etc.  Lab Results  Component Value Date   HGBA1C 8.7 (A) 06/18/2021   HGBA1C 8.9 (H) 03/03/2021   HGBA1C 9.4 (H) 02/26/2021   A/P: poor control of diabetes- wants to hold off on changing medicine and focus on healthy eating/regular exercise- strongly encouraged medication- she declines  #HM_ also declines cologuard firmly   #hypertension S: medication:  doxazosin 2 mg, triamterene-hctz 37.5-25 mg -prior on triamterene hctz but with hospitalization thought dehydrating potentially and stopped in 2023 BP Readings from Last 3 Encounters:  09/10/21 136/70  08/25/21 138/78  08/03/21 (!) 179/91  A/P: . High normal but controlled. Continue current medications.   Recommended follow up: Return for as needed for new, worsening, persistent symptoms.  Lab/Order associations:   ICD-10-CM   1. Dysuria  R30.0 Urine Culture    POCT Urinalysis Dipstick    Urine Culture    2. Type 2 diabetes mellitus with hyperglycemia, without long-term current use of insulin (HCC)  E11.65     3. Primary hypertension  I10       Meds ordered this encounter  Medications   nitrofurantoin, macrocrystal-monohydrate, (MACROBID) 100 MG capsule    Sig: Take 1 capsule (100 mg total) by mouth 2 (two) times daily.    Dispense:  28 capsule    Refill:  0    Return precautions advised.  Garret Reddish, MD

## 2021-09-10 NOTE — Patient Instructions (Addendum)
Pretty pretty please with a cherry on top complete cologuard- it would make my day! You declined for now  Trial antibiotic this time for 2 weeks. May need to change after culture data comes back- may not be back until Monday   If new or worsening symptoms over the weekend seek care with your history  Recommended follow up: Return for as needed for new, worsening, persistent symptoms.

## 2021-09-13 LAB — URINE CULTURE
MICRO NUMBER:: 13826860
SPECIMEN QUALITY:: ADEQUATE

## 2021-09-14 ENCOUNTER — Telehealth: Payer: Self-pay | Admitting: Family Medicine

## 2021-09-14 ENCOUNTER — Other Ambulatory Visit: Payer: Self-pay | Admitting: Family Medicine

## 2021-09-14 MED ORDER — SULFAMETHOXAZOLE-TRIMETHOPRIM 800-160 MG PO TABS: 1.0000 | ORAL_TABLET | Freq: Two times a day (BID) | ORAL | 0 refills | Status: DC

## 2021-09-14 NOTE — Telephone Encounter (Signed)
Caller states: - Patient has thrown up twice from the medication she was prescribed on 09/10/21  Caller requests: - A call from PCP to explain results of urine culture and recommendations on what to do next

## 2021-09-14 NOTE — Telephone Encounter (Signed)
FYI

## 2021-09-14 NOTE — Telephone Encounter (Signed)
See result note- high priority

## 2021-09-15 NOTE — Telephone Encounter (Signed)
Called and spoke with pt and pt made aware of results.

## 2021-10-30 ENCOUNTER — Encounter: Payer: Self-pay | Admitting: Family Medicine

## 2021-10-30 ENCOUNTER — Ambulatory Visit (INDEPENDENT_AMBULATORY_CARE_PROVIDER_SITE_OTHER): Payer: Medicare HMO | Admitting: Family Medicine

## 2021-10-30 VITALS — BP 124/70 | HR 86 | Temp 97.8°F | Ht 62.0 in | Wt 171.2 lb

## 2021-10-30 DIAGNOSIS — R3589 Other polyuria: Secondary | ICD-10-CM | POA: Diagnosis not present

## 2021-10-30 DIAGNOSIS — E1165 Type 2 diabetes mellitus with hyperglycemia: Secondary | ICD-10-CM

## 2021-10-30 DIAGNOSIS — I10 Essential (primary) hypertension: Secondary | ICD-10-CM | POA: Diagnosis not present

## 2021-10-30 DIAGNOSIS — E785 Hyperlipidemia, unspecified: Secondary | ICD-10-CM

## 2021-10-30 DIAGNOSIS — E1169 Type 2 diabetes mellitus with other specified complication: Secondary | ICD-10-CM | POA: Diagnosis not present

## 2021-10-30 LAB — COMPREHENSIVE METABOLIC PANEL
ALT: 17 U/L (ref 0–35)
AST: 14 U/L (ref 0–37)
Albumin: 3.7 g/dL (ref 3.5–5.2)
Alkaline Phosphatase: 116 U/L (ref 39–117)
BUN: 17 mg/dL (ref 6–23)
CO2: 27 mEq/L (ref 19–32)
Calcium: 9.1 mg/dL (ref 8.4–10.5)
Chloride: 100 mEq/L (ref 96–112)
Creatinine, Ser: 0.86 mg/dL (ref 0.40–1.20)
GFR: 66.27 mL/min (ref >60.00)
Glucose, Bld: 211 mg/dL — ABNORMAL HIGH (ref 70–99)
Potassium: 3.9 mEq/L (ref 3.5–5.1)
Sodium: 135 mEq/L (ref 135–145)
Total Bilirubin: 0.4 mg/dL (ref 0.2–1.2)
Total Protein: 7.7 g/dL (ref 6.0–8.3)

## 2021-10-30 LAB — HEMOGLOBIN A1C: Hgb A1c MFr Bld: 8.7 % — ABNORMAL HIGH (ref 4.6–6.5)

## 2021-10-30 LAB — MICROALBUMIN / CREATININE URINE RATIO
Creatinine,U: 119.8 mg/dL
Microalb Creat Ratio: 78.9 mg/g — ABNORMAL HIGH (ref 0.0–30.0)
Microalb, Ur: 94.6 mg/dL — ABNORMAL HIGH (ref 0.0–1.9)

## 2021-10-30 NOTE — Progress Notes (Signed)
Phone [phone] In person visit   Subjective:   Meagan [name] is a 76 y.o. year old very pleasant female patient who presents for/with See problem oriented charting Chief Complaint  Patient presents with   Dysuria    Pt c/o dysuria    Past Medical History-  Patient Active Problem List   Diagnosis Date Noted   E coli bacteremia 04/17/2021    Priority: High   History of TIA (transient ischemic attack) 04/15/2021    Priority: High   Diabetes mellitus, type 2 ([place]) 02/12/2015    Priority: High   Normocytic anemia 04/16/2021    Priority: Medium    Chronic kidney disease, stage 3a ([place]) 04/15/2021    Priority: Medium    Acid reflux 02/12/2015    Priority: Medium    Hyperlipidemia associated with type 2 diabetes mellitus ([place]) 02/12/2015    Priority: Medium    Primary hypertension 12/08/2011    Priority: Medium    GERD (gastroesophageal reflux disease) 03/03/2021    Priority: Low   Dermatitis, eczematoid 02/12/2015    Priority: Low   History of endometrial cancer 12/08/2011    Priority: Low    Medications- reviewed and updated Current Outpatient Medications  Medication Sig Dispense Refill   acetaminophen (TYLENOL) 500 MG tablet Take 500 mg by mouth every 6 (six) hours as needed for mild pain or moderate pain.     aspirin EC 81 MG tablet Take 81 mg by mouth daily. Swallow whole.     doxazosin (CARDURA) 2 MG tablet TAKE 1 TABLET AT BEDTIME 90 tablet 3   famotidine (PEPCID) 20 MG tablet Take 20 mg by mouth daily.     folic acid (FOLVITE) 1 MG tablet Take 1 tablet (1 mg total) by mouth daily. 90 tablet 1   glimepiride (AMARYL) 4 MG tablet TAKE 1 TABLET TWICE DAILY 180 tablet 3   vitamin B-12 1000 MCG tablet Take 1 tablet (1,000 mcg total) by mouth daily. 90 tablet 1   No current facility-administered medications for this visit.     Objective:  BP 124/70   Pulse 86   Temp 97.8 F (36.6 C)   Ht '5\' 2"'$  (1.575 m)   Wt 171 lb 3.2 oz (77.7 kg)   LMP 01/18/2001    SpO2 97%   BMI 31.31 kg/m  Gen: NAD, resting comfortably CV: RRR no murmurs rubs or gallops Lungs: CTAB no crackles, wheeze, rhonchi Abdomen: soft/nontender except for pain in left upper quadrant radiating around to her back-reproducible with palpation/nondistended/normal bowel sounds. No rebound or guarding.  Ext: no edema Skin: warm, dry    Assessment and Plan   # Left upper quadrant pain radiating to the left thoracic spin S:this has been on and off for months- had one month with no pain in September approximately before coming back. Has periods of confusion at times that are short lived for 30 minutes- MRI brain 06/15/21 though motion degraded. Wondered if could be related to UTI. On that MRI "prominent cervical spine degeneration which could cause cord impingement" -MRI cervical spine 07/11/21 "Cervical spondylosis, as outlined. Severe spinal canal stenosis with significant spinal cord flattening at C2-C3, C3-C4, C4-C5, C5-C6 and C6-C7. Signal abnormality within the spinal cord at the C5-C6 level, which may reflect myelomalacia and/or focal edema.   Extruded disc material posterior to the C3 vertebral body. It is unclear if this disc material is arising from the C2-C3 or C3-C4 disc space. Resultant severe spinal canal stenosis with severe spinal cord flattening  at the C3 vertebral body level.   Multilevel foraminal stenosis, as detailed and greatest on the left at C3-C4, bilaterally at C4-C5, bilaterally at C5-C6 and on the right at C6-C7 (severe at these sites).   Disc degeneration is moderate at the C3-C4 through C5-C6 levels. Associated mild degenerative endplate edema at Z6-X0 and C6-C7.   Nonspecific straightening of the expected cervical lordosis"  No dysuria. Is having frequency A/P: Patient with tenderness to palpation in region of reported pain.  Has already had CT imaging of this area with CT abdomen pelvis 08/03/2021 and has been having pain at least since that time.   Discussed this potentially coming from her cervical spine but she states Dr. Reatha Armour stated was not correlated.  Mentioned imaging of thoracic spine but she wanted to hold off for now -Originally the concern was for UTI per reason for visit but patient does not have any dysuria.  She does have polyuria but that is likely related to poorly controlled blood sugar-regardless we will check a urine culture to be on the safe side -She is certainly overdue for colonoscopy but declines colonoscopy-theoretically pain could be related to GI tract but I doubt it with superficial pain.  Regardless she declines follow-up  #Thyroid ultrasound recommended- 1.6 cm incidental nodule- she declines for now-I told her this was a primary care problem primarily but she wants to discuss with Dr. Reatha Armour and is also worried about costs  # Diabetes S: Medication: Glimepiride 4 mg twice daily Lab Results  Component Value Date   HGBA1C 8.7 (A) 06/18/2021   HGBA1C 8.9 (H) 03/03/2021   HGBA1C 9.4 (H) 02/26/2021   A/P: Ongoing poor control -Declines metformin worried about GI side effects -Declines Actos with family history of CHF -Declines insulin but may be possibly interested depending on husband's course with new start-rely on options may make this more cost effective -Declines diabetes education - Cost is a major concern so declines Ozempic and with prior UTI issues do not think Jardiance is a good choice plus would be costly - Does agree to referral to CCM- medication barriers plus imaging costs- will ask for pharmacy, RN, Social work- patient is not interested in most treatment options and wondering if a different voice may help with this-I need assistance as she has been highly resistant to any of my recommendations and would really like to help control her chronic diseases better particularly diabetes -She is only interested in working on diet and exercise but then does not make significant changes-in fact has gained  mild weight   #hypertension S: medication:  doxazosin 2 mg -prior on triamterene hctz but with hospitalization thought dehydrating potentially and stopped in 2023 BP Readings from Last 3 Encounters:  10/30/21 124/70  09/10/21 136/70  08/25/21 138/78  A/P: Blood pressure well controlled on doxazosin 2 mg  #hyperlipidemia S: Medication: Rosuvastatin 5 mg once a week prescribed-she states she never received this and declines trialing today Lab Results  Component Value Date   CHOL 151 03/03/2021   HDL 44 03/03/2021   LDLCALC 79 03/03/2021   TRIG 140 03/03/2021   CHOLHDL 3.4 03/03/2021   A/P: Poorly controlled-declines statin though unfortunately   Recommended follow up: Return in about 4 months (around 03/02/2022) for physical or sooner if needed.Schedule b4 you leave. No future appointments.   Lab/Order associations:   ICD-10-CM   1. Polyuria  R35.89 Urine Culture    POCT Urinalysis Dipstick (Automated)    2. Type 2 diabetes mellitus with  hyperglycemia, without long-term current use of insulin (HCC)  E11.65 Microalbumin / creatinine urine ratio    CBC with Differential/Platelet    Comprehensive metabolic panel    Hemoglobin A1c    AMB Referral to Chronic Care Management Services    3. Primary hypertension  I10 AMB Referral to Chronic Care Management Services    4. Hyperlipidemia associated with type 2 diabetes mellitus ([place])  E11.69    E78.5       No orders of the defined types were placed in this encounter.   Return precautions advised.  Garret Reddish, MD

## 2021-10-30 NOTE — Patient Instructions (Addendum)
Get your diabetic eye exam scheduled.  Let us know when you get your [facility] vaccine at the pharmacy.  Please stop by lab before you go If you have mychart- we will send your results within 3 business days of [place] receiving them.  If you do not have mychart- we will call you about results within 5 business days of [place] receiving them.  *please also note that you will see labs on mychart as soon as they post. I will later go in and write notes on them- will say "notes from Dr. Yong Channel"   Recommended follow up: Return in about 4 months (around 03/02/2022) for physical or sooner if needed.Schedule b4 you leave.

## 2021-11-02 LAB — CBC WITH DIFFERENTIAL/PLATELET
Basophils Absolute: 0.1 10*3/uL (ref 0.0–0.1)
Basophils Relative: 1.5 % (ref 0.0–3.0)
Eosinophils Absolute: 0.3 10*3/uL (ref 0.0–0.7)
Eosinophils Relative: 4 % (ref 0.0–5.0)
HCT: 44.4 % (ref 36.0–46.0)
Hemoglobin: 14.6 g/dL (ref 12.0–15.0)
Lymphocytes Relative: 32.3 % (ref 12.0–46.0)
Lymphs Abs: 2.5 10*3/uL (ref 0.7–4.0)
MCHC: 32.9 g/dL (ref 30.0–36.0)
MCV: 84.6 fl (ref 78.0–100.0)
Monocytes Absolute: 0.6 10*3/uL (ref 0.1–1.0)
Monocytes Relative: 7.3 % (ref 3.0–12.0)
Neutro Abs: 4.2 10*3/uL (ref 1.4–7.7)
Neutrophils Relative %: 53.6 % (ref 43.0–77.0)
Platelets: 257 10*3/uL (ref 150.0–400.0)
RBC: 5.25 Mil/uL — ABNORMAL HIGH (ref 3.87–5.11)
RDW: 13.6 % (ref 11.5–15.5)
WBC: 7.6 10*3/uL (ref 4.0–10.5)

## 2021-11-04 ENCOUNTER — Telehealth: Payer: Self-pay | Admitting: Family Medicine

## 2021-11-04 ENCOUNTER — Other Ambulatory Visit: Payer: Self-pay

## 2021-11-04 MED ORDER — LISINOPRIL 2.5 MG PO TABS: 2.5000 mg | ORAL_TABLET | Freq: Every day | ORAL | 3 refills | Status: DC

## 2021-11-04 NOTE — Telephone Encounter (Signed)
Called and spoke with pt and questions answered. 

## 2021-11-04 NOTE — Telephone Encounter (Signed)
Patient requests to be called at ph# [phone]  regarding Patient has questions about the new medication that was discussed today (11/04/21) with CMA on the phone.

## 2021-11-13 ENCOUNTER — Other Ambulatory Visit: Payer: Self-pay

## 2021-11-13 MED ORDER — CYANOCOBALAMIN 1000 MCG PO TABS: 1000.0000 ug | ORAL_TABLET | Freq: Every day | ORAL | 1 refills | Status: DC

## 2021-11-17 ENCOUNTER — Ambulatory Visit: Payer: Medicare HMO

## 2021-11-18 DIAGNOSIS — M4802 Spinal stenosis, cervical region: Secondary | ICD-10-CM | POA: Diagnosis not present

## 2021-11-18 DIAGNOSIS — G959 Disease of spinal cord, unspecified: Secondary | ICD-10-CM | POA: Diagnosis not present

## 2021-11-18 DIAGNOSIS — Z6830 Body mass index (BMI) 30.0-30.9, adult: Secondary | ICD-10-CM | POA: Diagnosis not present

## 2021-11-26 ENCOUNTER — Telehealth: Payer: Self-pay | Admitting: Family Medicine

## 2021-11-26 MED ORDER — FAMOTIDINE 20 MG PO TABS: 20.0000 mg | ORAL_TABLET | Freq: Every day | ORAL | 3 refills | Status: DC

## 2021-11-26 NOTE — Telephone Encounter (Signed)
  LAST APPOINTMENT DATE:   10/30/21 OV with PCP   NEXT APPOINTMENT DATE: 04/07/21 CPE   MEDICATION: famotidine (PEPCID) 20 MG tablet [[id number]]    Is the patient out of medication?  Unknown, does not "want to pick it up at the pharmacy anymore."   PHARMACY: [facility], [address] [place], [place] [address] Phone: [phone]  Fax: [phone]

## 2021-11-26 NOTE — Telephone Encounter (Signed)
Rx sent to requested pharmacy

## 2021-12-04 ENCOUNTER — Telehealth: Payer: Self-pay

## 2021-12-04 NOTE — Progress Notes (Signed)
   Chronic Care Management   Note  12/04/2021 Name: TEYA OTTERSON MRN: [id number] DOB: 05-16-1945  JI FAIRBURN is a 76 y.o. year old female who is a primary care patient of Marin Olp, MD. I reached out to Iva Boop by phone today in response to a referral sent by Ms. Kirtland Bouchard Dunaj's PCP.  Ms. BREELYN ICARD was not successfully contacted today. A HIPAA compliant voice message was left requesting a return call.   Follow up plan: Additional outreach attempts will be made.  Noreene Larsson, [place], [place] [address] Direct Dial: [phone] [email or web address]

## 2021-12-04 NOTE — Progress Notes (Signed)
  Chronic Care Management   Note  12/04/2021 Name: Meagan [name] MRN: [id number] DOB: March 14, 1945  Meagan [name] is a 76 y.o. year old female who is a primary care patient of Marin Olp, MD. I reached out to Meagan [name] by phone today in response to a referral sent by Meagan [name]'s PCP.  Meagan [name]  declinedto scheduling an appointment with the CCM RN Case Manager, LCSW and Pharm D   Follow up plan: Patient did not agree to scheduling an appointment with the RN Case Manager, LCSW and Pharm D . The ordering provider has been notified.   Noreene Larsson, [place], [place] [address] Direct Dial: [phone] [email or web address]

## 2021-12-16 ENCOUNTER — Ambulatory Visit (INDEPENDENT_AMBULATORY_CARE_PROVIDER_SITE_OTHER): Payer: Medicare HMO | Admitting: Family Medicine

## 2021-12-16 ENCOUNTER — Encounter: Payer: Self-pay | Admitting: Family Medicine

## 2021-12-16 VITALS — BP 138/82 | HR 91 | Temp 98.0°F | Ht 62.0 in | Wt 173.2 lb

## 2021-12-16 DIAGNOSIS — R350 Frequency of micturition: Secondary | ICD-10-CM | POA: Diagnosis not present

## 2021-12-16 DIAGNOSIS — J208 Acute bronchitis due to other specified organisms: Secondary | ICD-10-CM

## 2021-12-16 DIAGNOSIS — B9689 Other specified bacterial agents as the cause of diseases classified elsewhere: Secondary | ICD-10-CM | POA: Diagnosis not present

## 2021-12-16 DIAGNOSIS — E1165 Type 2 diabetes mellitus with hyperglycemia: Secondary | ICD-10-CM | POA: Diagnosis not present

## 2021-12-16 LAB — POCT URINALYSIS DIPSTICK
Bilirubin, UA: NEGATIVE
Glucose, UA: NEGATIVE
Ketones, UA: NEGATIVE
Leukocytes, UA: NEGATIVE
Nitrite, UA: NEGATIVE
Protein, UA: NEGATIVE
Spec Grav, UA: 1.025 (ref 1.010–1.025)
Urobilinogen, UA: NEGATIVE E.U./dL — AB
pH, UA: 6 (ref 5.0–8.0)

## 2021-12-16 MED ORDER — AZITHROMYCIN 250 MG PO TABS: ORAL_TABLET | ORAL | 0 refills | Status: DC

## 2021-12-16 NOTE — Progress Notes (Signed)
Subjective  CC:  Chief Complaint  Patient presents with   Cough    Pt stated that she has been cough for the past 2 weeks and coughing up yellow flem. Also has a urgency to pee more than normal.    Urinary Frequency    HPI: SUBJECTIVE:  Meagan [name] is a 76 y.o. female who complains of congestion, nasal blockage, post nasal drip, cough described as productive and denies sinus, high fevers, SOB, chest pain or significant GI symptoms. Symptoms have been present for 1-2 weeks. Her husband had same illness and was evaluated and treated by me last week; zpak and supportive care resolved his symptoms. She has diabetes with most recent sugars being in the mid 100s. . She denies a history of anorexia, dizziness, vomiting and wheezing. She denies a history of asthma or COPD. Patient does not smoke cigarettes.  Also c/o urinary frequency and a "funny feeling" when she voids; she reports h/o recurrent uti and h/o urosepsis. No f/c/s or pelvic pain. She does have chronic proteinuria due to her diabetes and has been started on lisinopril.   Assessment  1. Acute bacterial bronchitis   2. Frequent urination   3. Type 2 diabetes mellitus with hyperglycemia, without long-term current use of insulin (HCC)      Plan  Discussion:  Treat for bacterial bronchitis due to prolonged course and worsening symptoms. Education regarding differences between viral and bacterial infections and treatment options are discussed.  Supportive care measures are recommended.  We discussed the use of mucolytic's, decongestants, antihistamines and antitussives as needed.  Tylenol or Advil are recommended if needed.  Discussed diabetes management and diet to avoid hyperglycemia while sick.   Urinary frequency: likely relate to DM and illness but will f/u with culture given pt's h/o urosepsis with atypical sxs and her level of concern.   Follow up: prn   Orders Placed This Encounter  Procedures   Urine Culture   POCT  urinalysis dipstick   Meds ordered this encounter  Medications   azithromycin (ZITHROMAX) 250 MG tablet    Sig: Take 2 tabs today, then 1 tab daily for 4 days    Dispense:  1 each    Refill:  0      I reviewed the patients updated PMH, FH, and SocHx.  Social History: Patient  reports that she has never smoked. She has never used smokeless tobacco. She reports that she does not drink alcohol and does not use drugs.  Patient Active Problem List   Diagnosis Date Noted   E coli bacteremia 04/17/2021   Normocytic anemia 04/16/2021   Chronic kidney disease, stage 3a ([place]) 04/15/2021   History of TIA (transient ischemic attack) 04/15/2021   GERD (gastroesophageal reflux disease) 03/03/2021   Dermatitis, eczematoid 02/12/2015   Diabetes mellitus, type 2 ([place]) 02/12/2015   Acid reflux 02/12/2015   Hyperlipidemia associated with type 2 diabetes mellitus ([place]) 02/12/2015   History of endometrial cancer 12/08/2011   Primary hypertension 12/08/2011    Review of Systems: Cardiovascular: negative for chest pain Respiratory: negative for SOB or hemoptysis Gastrointestinal: negative for abdominal pain Genitourinary: negative for dysuria or gross hematuria Current Meds  Medication Sig   azithromycin (ZITHROMAX) 250 MG tablet Take 2 tabs today, then 1 tab daily for 4 days    Objective  Vitals: BP 138/82   Pulse 91   Temp 98 F (36.7 C)   Ht '5\' 2"'$  (1.575 m)   Wt 173 lb 3.2 oz (  78.6 kg)   LMP 01/18/2001   SpO2 97%   BMI 31.68 kg/m  General: no acute distress  Psych:  Alert and oriented, normal mood and affect HEENT:  Normocephalic, atraumatic, supple neck,  no cervical lad, supple neck Cardiovascular:  RRR without murmur. no edema Respiratory:  Good breath sounds bilaterally, CTAB with normal respiratory effort with occasional rhonchi Abd: no CVAT or suprapubic ttp Skin:  Warm, no rashes Neurologic:   Mental status is normal. normal gait  Office Visit on 12/16/2021  Component  Date Value Ref Range Status   Color, UA 12/16/2021 yeloow   Final   Clarity, UA 12/16/2021 clear   Final   Glucose, UA 12/16/2021 Negative  Negative Final   Bilirubin, UA 12/16/2021 negative   Final   Ketones, UA 12/16/2021 negative   Final   Spec Grav, UA 12/16/2021 1.025  1.010 - 1.025 Final   Blood, UA 12/16/2021 1+   Final   pH, UA 12/16/2021 6.0  5.0 - 8.0 Final   Protein, UA 12/16/2021 Negative  Negative Final   Urobilinogen, UA 12/16/2021 negative (A)  0.2 or 1.0 E.U./dL Final   Nitrite, UA 12/16/2021 negative   Final   Leukocytes, UA 12/16/2021 Negative  Negative Final     Commons side effects, risks, benefits, and alternatives for medications and treatment plan prescribed today were discussed, and the patient expressed understanding of the given instructions. Patient is instructed to call or message via MyChart if he/she has any questions or concerns regarding our treatment plan. No barriers to understanding were identified. We discussed Red Flag symptoms and signs in detail. Patient expressed understanding regarding what to do in case of urgent or emergency type symptoms.  Medication list was reconciled, printed and provided to the patient in AVS. Patient instructions and summary information was reviewed with the patient as documented in the AVS. This note was prepared with assistance of Dragon voice recognition software. Occasional wrong-word or sound-a-like substitutions may have occurred due to the inherent limitations of voice recognition software

## 2021-12-16 NOTE — Patient Instructions (Signed)
Please follow up if symptoms do not improve or as needed.   

## 2021-12-19 LAB — URINE CULTURE
MICRO NUMBER:: 14246220
SPECIMEN QUALITY:: ADEQUATE

## 2021-12-21 MED ORDER — SULFAMETHOXAZOLE-TRIMETHOPRIM 800-160 MG PO TABS: 1.0000 | ORAL_TABLET | Freq: Two times a day (BID) | ORAL | 0 refills | Status: AC

## 2021-12-21 NOTE — Addendum Note (Signed)
Addended by: Billey Chang on: 12/21/2021 09:25 PM   Modules accepted: Orders

## 2021-12-22 ENCOUNTER — Telehealth: Payer: Self-pay | Admitting: Family Medicine

## 2021-12-22 NOTE — Telephone Encounter (Signed)
Called and spoke to pt husband, pt wanted to know what the antibiotic was for, informed pt that it was for a bladder infection.

## 2021-12-22 NOTE — Telephone Encounter (Signed)
Pt's spouse called in regarding labs & antibiotic that was ordered. He is asking for a call back. Please call him due to pt not answering her phone.

## 2021-12-29 ENCOUNTER — Telehealth: Payer: Self-pay | Admitting: Family Medicine

## 2021-12-29 NOTE — Telephone Encounter (Signed)
FYI

## 2021-12-29 NOTE — Telephone Encounter (Signed)
Pt's spouse states pt is having the same symptoms he has been having. He is requesting Yong Channel to send in the same antibiotic for her. I advised a visit but he declined. Please advise    MEDICATION:  amoxicillin-clavulanate (AUGMENTIN) 875-125 MG tablet   PHARMACY:  [place], [place] Phone: [phone]  Fax: [phone]

## 2021-12-29 NOTE — Telephone Encounter (Signed)
I agree that she needs a visit

## 2021-12-30 NOTE — Telephone Encounter (Signed)
Please  schedule ov for pt for pt per Dr. Yong Channel.

## 2021-12-31 ENCOUNTER — Ambulatory Visit (INDEPENDENT_AMBULATORY_CARE_PROVIDER_SITE_OTHER): Payer: Medicare HMO | Admitting: Family Medicine

## 2021-12-31 ENCOUNTER — Encounter: Payer: Self-pay | Admitting: Family Medicine

## 2021-12-31 VITALS — BP 156/62 | HR 95 | Temp 97.3°F | Wt 167.4 lb

## 2021-12-31 DIAGNOSIS — J208 Acute bronchitis due to other specified organisms: Secondary | ICD-10-CM | POA: Diagnosis not present

## 2021-12-31 DIAGNOSIS — B9689 Other specified bacterial agents as the cause of diseases classified elsewhere: Secondary | ICD-10-CM | POA: Diagnosis not present

## 2021-12-31 MED ORDER — CEPHALEXIN 500 MG PO CAPS: 500.0000 mg | ORAL_CAPSULE | Freq: Two times a day (BID) | ORAL | 0 refills | Status: DC

## 2021-12-31 NOTE — Patient Instructions (Signed)
Please follow up if symptoms do not improve or as needed.   

## 2021-12-31 NOTE — Progress Notes (Signed)
Subjective  CC:  Chief Complaint  Patient presents with   Cough    3 weeks, no other Sx    HPI: SUBJECTIVE:  Meagan [name] is a 76 y.o. female who complains of congestion, nasal blockage, post nasal drip, cough described as productive and denies sinus, high fevers, SOB, chest pain or significant GI symptoms. Symptoms have been present for 3-5 days. She denies a history of anorexia, dizziness, vomiting and wheezing. She denies a history of asthma or COPD. Patient does not smoke cigarettes. She is here with her husband who both request augmentin. I reviewed her chart: pt with multiple abx over last year and had zpak and septra since 11/30 for bronchitis and e.coli UTI>   However, pt feels strongly she has a bacterial infection; her husband received augmentin for same illness from pcp a few days ago. He had zpak from me 3 weeks prior.  Assessment  1. Acute bacterial bronchitis      Plan  Discussion:  Discussed viral vs bacterial infections but given her husband's improvement with abx they both urge me to give them. I discussed risks of overuse of abx.  Pt is pcn allergic; can tolerate cephalosporins. Keflex 500 bid ordered x 7 days. Supportive care  Follow up: rec f/u with Dr. Yong Channel if not improving.   No orders of the defined types were placed in this encounter.  Meds ordered this encounter  Medications   cephALEXin (KEFLEX) 500 MG capsule    Sig: Take 1 capsule (500 mg total) by mouth 2 (two) times daily.    Dispense:  14 capsule    Refill:  0      I reviewed the patients updated PMH, FH, and SocHx.  Social History: Patient  reports that she has never smoked. She has never used smokeless tobacco. She reports that she does not drink alcohol and does not use drugs.  Patient Active Problem List   Diagnosis Date Noted   E coli bacteremia 04/17/2021   Normocytic anemia 04/16/2021   Chronic kidney disease, stage 3a ([place]) 04/15/2021   History of TIA (transient ischemic  attack) 04/15/2021   GERD (gastroesophageal reflux disease) 03/03/2021   Dermatitis, eczematoid 02/12/2015   Diabetes mellitus, type 2 ([place]) 02/12/2015   Acid reflux 02/12/2015   Hyperlipidemia associated with type 2 diabetes mellitus ([place]) 02/12/2015   History of endometrial cancer 12/08/2011   Primary hypertension 12/08/2011    Review of Systems: Cardiovascular: negative for chest pain Respiratory: negative for SOB or hemoptysis Gastrointestinal: negative for abdominal pain Genitourinary: negative for dysuria or gross hematuria Current Meds  Medication Sig   acetaminophen (TYLENOL) 500 MG tablet Take 500 mg by mouth every 6 (six) hours as needed for mild pain or moderate pain.   aspirin EC 81 MG tablet Take 81 mg by mouth daily. Swallow whole.   cephALEXin (KEFLEX) 500 MG capsule Take 1 capsule (500 mg total) by mouth 2 (two) times daily.   cyanocobalamin 1000 MCG tablet Take 1 tablet (1,000 mcg total) by mouth daily.   doxazosin (CARDURA) 2 MG tablet TAKE 1 TABLET AT BEDTIME   famotidine (PEPCID) 20 MG tablet Take 1 tablet (20 mg total) by mouth daily.   folic acid (FOLVITE) 1 MG tablet Take 1 tablet (1 mg total) by mouth daily.   glimepiride (AMARYL) 4 MG tablet TAKE 1 TABLET TWICE DAILY   lisinopril (ZESTRIL) 2.5 MG tablet Take 1 tablet (2.5 mg total) by mouth daily.    Objective  Vitals:  BP (!) 156/62   Pulse 95   Temp (!) 97.3 F (36.3 C)   Wt 167 lb 6.4 oz (75.9 kg)   LMP 01/18/2001   SpO2 97%   BMI 30.62 kg/m  General: no acute distress  Psych:  Alert and oriented, normal mood and affect HEENT:  Normocephalic, atraumatic, supple neck, nasal congestion present, moist mucous membranes,  Cardiovascular:  RRR without murmur. no edema Respiratory:  Good breath sounds bilaterally, CTAB with normal respiratory effort with occasional rhonchi ]  Commons side effects, risks, benefits, and alternatives for medications and treatment plan prescribed today were discussed, and  the patient expressed understanding of the given instructions. Patient is instructed to call or message via MyChart if he/she has any questions or concerns regarding our treatment plan. No barriers to understanding were identified. We discussed Red Flag symptoms and signs in detail. Patient expressed understanding regarding what to do in case of urgent or emergency type symptoms.  Medication list was reconciled, printed and provided to the patient in AVS. Patient instructions and summary information was reviewed with the patient as documented in the AVS. This note was prepared with assistance of Dragon voice recognition software. Occasional wrong-word or sound-a-like substitutions may have occurred due to the inherent limitations of voice recognition software

## 2022-02-18 ENCOUNTER — Encounter: Payer: Self-pay | Admitting: Family Medicine

## 2022-02-18 ENCOUNTER — Ambulatory Visit (INDEPENDENT_AMBULATORY_CARE_PROVIDER_SITE_OTHER): Payer: Medicare HMO | Admitting: Family Medicine

## 2022-02-18 VITALS — BP 150/70 | HR 90 | Temp 98.2°F | Ht 62.0 in | Wt 171.8 lb

## 2022-02-18 DIAGNOSIS — E1165 Type 2 diabetes mellitus with hyperglycemia: Secondary | ICD-10-CM

## 2022-02-18 DIAGNOSIS — N390 Urinary tract infection, site not specified: Secondary | ICD-10-CM | POA: Diagnosis not present

## 2022-02-18 DIAGNOSIS — R300 Dysuria: Secondary | ICD-10-CM

## 2022-02-18 DIAGNOSIS — R3589 Other polyuria: Secondary | ICD-10-CM | POA: Diagnosis not present

## 2022-02-18 LAB — POCT URINALYSIS DIPSTICK
Bilirubin, UA: NEGATIVE
Glucose, UA: POSITIVE — AB
Ketones, UA: NEGATIVE
Nitrite, UA: POSITIVE
Protein, UA: POSITIVE — AB
Spec Grav, UA: 1.015 (ref 1.010–1.025)
Urobilinogen, UA: NEGATIVE E.U./dL — AB
pH, UA: 7 (ref 5.0–8.0)

## 2022-02-18 MED ORDER — SULFAMETHOXAZOLE-TRIMETHOPRIM 800-160 MG PO TABS: 1.0000 | ORAL_TABLET | Freq: Two times a day (BID) | ORAL | 0 refills | Status: DC

## 2022-02-18 NOTE — Progress Notes (Signed)
Subjective   CC:  Chief Complaint  Patient presents with   Abdominal Pain    Pt stae dthat she is having some lower abd pain for the past 3 days along with painful urination     HPI: Meagan [name] is a 77 y.o. female who presents to the office today to address the problems listed above in the chief complaint. Patient reports dysuria and urinary frequency.  She has sensation of increased urinary pressure.  She denies fevers flank pain nausea vomiting or gross hematuria.  Symptoms have been present for several hours to days.  She denies history of interstitial cystitis.  She denies vaginal symptoms including vaginal discharge or pelvic pain. Reviewed chart; has h/o recurrent uti's and frequent abx use. Reviewed last years results for urines. Allergic to pcn.  Assessment  1. Recurrent UTI   2. Dysuria   3. Type 2 diabetes mellitus with hyperglycemia, without long-term current use of insulin (HCC)      Plan  Recurrent uti: treat with septra and await culture. May warrant abx prophylaxis or urology. No sxs of upper uti Dm with glycosuria. hydrate  Follow up: prn  Orders Placed This Encounter  Procedures   Urine Culture   POCT Urinalysis Dipstick   Meds ordered this encounter  Medications   sulfamethoxazole-trimethoprim (BACTRIM DS) 800-160 MG tablet    Sig: Take 1 tablet by mouth 2 (two) times daily.    Dispense:  10 tablet    Refill:  0      I reviewed the patients updated PMH, FH, and SocHx.    Patient Active Problem List   Diagnosis Date Noted   E coli bacteremia 04/17/2021   Normocytic anemia 04/16/2021   Chronic kidney disease, stage 3a ([place]) 04/15/2021   History of TIA (transient ischemic attack) 04/15/2021   GERD (gastroesophageal reflux disease) 03/03/2021   Dermatitis, eczematoid 02/12/2015   Diabetes mellitus, type 2 ([place]) 02/12/2015   Acid reflux 02/12/2015   Hyperlipidemia associated with type 2 diabetes mellitus ([place]) 02/12/2015   History of  endometrial cancer 12/08/2011   Primary hypertension 12/08/2011   Current Meds  Medication Sig   acetaminophen (TYLENOL) 500 MG tablet Take 500 mg by mouth every 6 (six) hours as needed for mild pain or moderate pain.   aspirin EC 81 MG tablet Take 81 mg by mouth daily. Swallow whole.   cyanocobalamin 1000 MCG tablet Take 1 tablet (1,000 mcg total) by mouth daily.   doxazosin (CARDURA) 2 MG tablet TAKE 1 TABLET AT BEDTIME   famotidine (PEPCID) 20 MG tablet Take 1 tablet (20 mg total) by mouth daily.   folic acid (FOLVITE) 1 MG tablet Take 1 tablet (1 mg total) by mouth daily.   glimepiride (AMARYL) 4 MG tablet TAKE 1 TABLET TWICE DAILY   lisinopril (ZESTRIL) 2.5 MG tablet Take 1 tablet (2.5 mg total) by mouth daily.   sulfamethoxazole-trimethoprim (BACTRIM DS) 800-160 MG tablet Take 1 tablet by mouth 2 (two) times daily.    Review of Systems: Cardiovascular: negative for chest pain Respiratory: negative for SOB or persistent cough Gastrointestinal: negative for abdominal pain Constitutional: Negative for fever malaise or anorexia  Objective  Vitals: BP (!) 150/70   Pulse 90   Temp 98.2 F (36.8 C)   Ht '5\' 2"'$  (1.575 m)   Wt 171 lb 12.8 oz (77.9 kg)   LMP 01/18/2001   SpO2 95%   BMI 31.42 kg/m  General: no acute distress   Gastrointestinal: soft, flat abdomen,  normal active bowel sounds, no palpable masses, no hepatosplenomegaly, no appreciated hernias, NO CVAT, mild suprapubic ttp w/o rebound or guarding  Office Visit on 02/18/2022  Component Date Value Ref Range Status   Color, UA 02/18/2022 yellow   Final   Clarity, UA 02/18/2022 cloudy   Final   Glucose, UA 02/18/2022 Positive (A)  Negative Final   Bilirubin, UA 02/18/2022 negative   Final   Ketones, UA 02/18/2022 negative   Final   Spec Grav, UA 02/18/2022 1.015  1.010 - 1.025 Final   Blood, UA 02/18/2022 1+   Final   pH, UA 02/18/2022 7.0  5.0 - 8.0 Final   Protein, UA 02/18/2022 Positive (A)  Negative Final    Urobilinogen, UA 02/18/2022 negative (A)  0.2 or 1.0 E.U./dL Final   Nitrite, UA 02/18/2022 positive   Final   Leukocytes, UA 02/18/2022 Moderate (2+) (A)  Negative Final   Commons side effects, risks, benefits, and alternatives for medications and treatment plan prescribed today were discussed, and the patient expressed understanding of the given instructions. Patient is instructed to call or message via MyChart if he/she has any questions or concerns regarding our treatment plan. No barriers to understanding were identified. We discussed Red Flag symptoms and signs in detail. Patient expressed understanding regarding what to do in case of urgent or emergency type symptoms.  Medication list was reconciled, printed and provided to the patient in AVS. Patient instructions and summary information was reviewed with the patient as documented in the AVS. This note was prepared with assistance of Dragon voice recognition software. Occasional wrong-word or sound-a-like substitutions may have occurred due to the inherent limitations of voice recognition software

## 2022-02-18 NOTE — Patient Instructions (Signed)
Please follow up if symptoms do not improve or as needed.    Urinary Tract Infection, Adult  A urinary tract infection (UTI) is an infection of any part of the urinary tract. The urinary tract includes the kidneys, ureters, bladder, and urethra. These organs make, store, and get rid of urine in the body. An upper UTI affects the ureters and kidneys. A lower UTI affects the bladder and urethra. What are the causes? Most urinary tract infections are caused by bacteria in your genital area around your urethra, where urine leaves your body. These bacteria grow and cause inflammation of your urinary tract. What increases the risk? You are more likely to develop this condition if: You have a urinary catheter that stays in place. You are not able to control when you urinate or have a bowel movement (incontinence). You are female and you: Use a spermicide or diaphragm for birth control. Have low estrogen levels. Are pregnant. You have certain genes that increase your risk. You are sexually active. You take antibiotic medicines. You have a condition that causes your flow of urine to slow down, such as: An enlarged prostate, if you are female. Blockage in your urethra. A kidney stone. A nerve condition that affects your bladder control (neurogenic bladder). Not getting enough to drink, or not urinating often. You have certain medical conditions, such as: Diabetes. A weak disease-fighting system (immunesystem). Sickle cell disease. Gout. Spinal cord injury. What are the signs or symptoms? Symptoms of this condition include: Needing to urinate right away (urgency). Frequent urination. This may include small amounts of urine each time you urinate. Pain or burning with urination. Blood in the urine. Urine that smells bad or unusual. Trouble urinating. Cloudy urine. Vaginal discharge, if you are female. Pain in the abdomen or the lower back. You may also have: Vomiting or a decreased  appetite. Confusion. Irritability or tiredness. A fever or chills. Diarrhea. The first symptom in older adults may be confusion. In some cases, they may not have any symptoms until the infection has worsened. How is this diagnosed? This condition is diagnosed based on your medical history and a physical exam. You may also have other tests, including: Urine tests. Blood tests. Tests for STIs (sexually transmitted infections). If you have had more than one UTI, a cystoscopy or imaging studies may be done to determine the cause of the infections. How is this treated? Treatment for this condition includes: Antibiotic medicine. Over-the-counter medicines to treat discomfort. Drinking enough water to stay hydrated. If you have frequent infections or have other conditions such as a kidney stone, you may need to see a health care provider who specializes in the urinary tract (urologist). In rare cases, urinary tract infections can cause sepsis. Sepsis is a life-threatening condition that occurs when the body responds to an infection. Sepsis is treated in the hospital with IV antibiotics, fluids, and other medicines. Follow these instructions at home:  Medicines Take over-the-counter and prescription medicines only as told by your health care provider. If you were prescribed an antibiotic medicine, take it as told by your health care provider. Do not stop using the antibiotic even if you start to feel better. General instructions Make sure you: Empty your bladder often and completely. Do not hold urine for long periods of time. Empty your bladder after sex. Wipe from front to back after urinating or having a bowel movement if you are female. Use each tissue only one time when you wipe. Drink enough fluid to keep  your urine pale yellow. Keep all follow-up visits. This is important. Contact a health care provider if: Your symptoms do not get better after 1-2 days. Your symptoms go away and then  return. Get help right away if: You have severe pain in your back or your lower abdomen. You have a fever or chills. You have nausea or vomiting. Summary A urinary tract infection (UTI) is an infection of any part of the urinary tract, which includes the kidneys, ureters, bladder, and urethra. Most urinary tract infections are caused by bacteria in your genital area. Treatment for this condition often includes antibiotic medicines. If you were prescribed an antibiotic medicine, take it as told by your health care provider. Do not stop using the antibiotic even if you start to feel better. Keep all follow-up visits. This is important. This information is not intended to replace advice given to you by your health care provider. Make sure you discuss any questions you have with your health care provider. Document Revised: 08/17/2019 Document Reviewed: 08/17/2019 Elsevier Patient Education  [place].

## 2022-02-20 LAB — URINE CULTURE
MICRO NUMBER:: 14505741
SPECIMEN QUALITY:: ADEQUATE

## 2022-04-08 ENCOUNTER — Encounter: Payer: Self-pay | Admitting: Family Medicine

## 2022-04-08 ENCOUNTER — Ambulatory Visit (INDEPENDENT_AMBULATORY_CARE_PROVIDER_SITE_OTHER): Payer: Medicare HMO | Admitting: Family Medicine

## 2022-04-08 VITALS — BP 140/70 | HR 94 | Temp 97.0°F | Ht 62.0 in | Wt 173.8 lb

## 2022-04-08 DIAGNOSIS — E1169 Type 2 diabetes mellitus with other specified complication: Secondary | ICD-10-CM

## 2022-04-08 DIAGNOSIS — E1165 Type 2 diabetes mellitus with hyperglycemia: Secondary | ICD-10-CM | POA: Diagnosis not present

## 2022-04-08 DIAGNOSIS — Z0000 Encounter for general adult medical examination without abnormal findings: Secondary | ICD-10-CM | POA: Diagnosis not present

## 2022-04-08 DIAGNOSIS — Z87440 Personal history of urinary (tract) infections: Secondary | ICD-10-CM | POA: Diagnosis not present

## 2022-04-08 DIAGNOSIS — R053 Chronic cough: Secondary | ICD-10-CM

## 2022-04-08 DIAGNOSIS — E785 Hyperlipidemia, unspecified: Secondary | ICD-10-CM

## 2022-04-08 LAB — LIPID PANEL
Cholesterol: 185 mg/dL (ref 0–200)
HDL: 51.9 mg/dL (ref >39.00)
NonHDL: 133.15
Total CHOL/HDL Ratio: 4
Triglycerides: 212 mg/dL — ABNORMAL HIGH (ref 0.0–149.0)
VLDL: 42.4 mg/dL — ABNORMAL HIGH (ref 0.0–40.0)

## 2022-04-08 LAB — URINALYSIS, ROUTINE W REFLEX MICROSCOPIC
Bilirubin Urine: NEGATIVE
Ketones, ur: NEGATIVE
Leukocytes,Ua: NEGATIVE
Nitrite: NEGATIVE
RBC / HPF: NONE SEEN (ref 0–?)
Specific Gravity, Urine: 1.02 (ref 1.000–1.030)
Total Protein, Urine: 100 — AB
Urine Glucose: NEGATIVE
Urobilinogen, UA: 0.2 (ref 0.0–1.0)
WBC, UA: NONE SEEN (ref 0–?)
pH: 6 (ref 5.0–8.0)

## 2022-04-08 LAB — COMPREHENSIVE METABOLIC PANEL
ALT: 22 U/L (ref 0–35)
AST: 16 U/L (ref 0–37)
Albumin: 3.8 g/dL (ref 3.5–5.2)
Alkaline Phosphatase: 96 U/L (ref 39–117)
BUN: 28 mg/dL — ABNORMAL HIGH (ref 6–23)
CO2: 24 mEq/L (ref 19–32)
Calcium: 9.2 mg/dL (ref 8.4–10.5)
Chloride: 102 mEq/L (ref 96–112)
Creatinine, Ser: 1.15 mg/dL (ref 0.40–1.20)
GFR: 46.62 mL/min — ABNORMAL LOW (ref >60.00)
Glucose, Bld: 237 mg/dL — ABNORMAL HIGH (ref 70–99)
Potassium: 4.3 mEq/L (ref 3.5–5.1)
Sodium: 135 mEq/L (ref 135–145)
Total Bilirubin: 0.5 mg/dL (ref 0.2–1.2)
Total Protein: 7.3 g/dL (ref 6.0–8.3)

## 2022-04-08 LAB — CBC WITH DIFFERENTIAL/PLATELET
Basophils Absolute: 0.1 10*3/uL (ref 0.0–0.1)
Basophils Relative: 0.9 % (ref 0.0–3.0)
Eosinophils Absolute: 0.4 10*3/uL (ref 0.0–0.7)
Eosinophils Relative: 4.5 % (ref 0.0–5.0)
HCT: 40.4 % (ref 36.0–46.0)
Hemoglobin: 13.8 g/dL (ref 12.0–15.0)
Lymphocytes Relative: 27.1 % (ref 12.0–46.0)
Lymphs Abs: 2.3 10*3/uL (ref 0.7–4.0)
MCHC: 34.1 g/dL (ref 30.0–36.0)
MCV: 83 fl (ref 78.0–100.0)
Monocytes Absolute: 0.5 10*3/uL (ref 0.1–1.0)
Monocytes Relative: 6.2 % (ref 3.0–12.0)
Neutro Abs: 5.1 10*3/uL (ref 1.4–7.7)
Neutrophils Relative %: 61.3 % (ref 43.0–77.0)
Platelets: 245 10*3/uL (ref 150.0–400.0)
RBC: 4.87 Mil/uL (ref 3.87–5.11)
RDW: 13.5 % (ref 11.5–15.5)
WBC: 8.3 10*3/uL (ref 4.0–10.5)

## 2022-04-08 LAB — MICROALBUMIN / CREATININE URINE RATIO
Creatinine,U: 58.2 mg/dL
Microalb Creat Ratio: 98.7 mg/g — ABNORMAL HIGH (ref 0.0–30.0)
Microalb, Ur: 57.5 mg/dL — ABNORMAL HIGH (ref 0.0–1.9)

## 2022-04-08 LAB — TSH: TSH: 0.93 u[IU]/mL (ref 0.35–5.50)

## 2022-04-08 LAB — LDL CHOLESTEROL, DIRECT: Direct LDL: 90 mg/dL

## 2022-04-08 LAB — HEMOGLOBIN A1C: Hgb A1c MFr Bld: 8.7 % — ABNORMAL HIGH (ref 4.6–6.5)

## 2022-04-08 MED ORDER — VALSARTAN 80 MG PO TABS: 40.0000 mg | ORAL_TABLET | Freq: Every day | ORAL | 3 refills | Status: DC

## 2022-04-08 MED ORDER — FAMOTIDINE 20 MG PO TABS: 20.0000 mg | ORAL_TABLET | Freq: Two times a day (BID) | ORAL | 3 refills | Status: DC

## 2022-04-08 NOTE — Patient Instructions (Addendum)
Recommend Tdap at pharmacy  Declined colonoscopy and mammogram   Please schedule visit with eye doctor and have them send [place] a message  Please go to [facility]  - located [address] across the [address] from [place] - in the basement - Hours: 8:30-5:00 PM M-F (with lunch from 12:30- 1 PM). You do NOT need an appointment.    - Could try allegra or zyrtec before bed  -stop lisinopril and start valsartan 40 mg (half of 80 mg tablet) because lisinopril can contribute to cough- if this is a cause of cough- could take a month or two to improve  blood pressure above goal- we are already switching to valsartan 40 mg to help with cough- if this does not improve blood pressure within a month may need higher dose  Recommended follow up: Return in about 1 month (around 05/09/2022) for followup or sooner if needed.Schedule b4 you leave.

## 2022-04-08 NOTE — Progress Notes (Signed)
Phone [phone]   Subjective:  Patient presents today for their annual physical. Chief complaint-noted.   See problem oriented charting- ROS- full  review of systems was completed and negative except for: ongoing chronic cough, balance issues  The following were reviewed and entered/updated in epic: Past Medical History:  Diagnosis Date   Anemia    hx    Anxiety    no meds   Arthritis    knees   Cancer ([place])    endometrial cancer   Diabetes mellitus without complication ([place])    on oral medication   GERD (gastroesophageal reflux disease)    pepcid now/famotidine   Hypertension    SVD (spontaneous vaginal delivery)    x 1   UTI (lower urinary tract infection) 11/22/2011   started abx on 11/24/11   Patient Active Problem List   Diagnosis Date Noted   E coli bacteremia 04/17/2021    Priority: High   History of TIA (transient ischemic attack) 04/15/2021    Priority: High   Diabetes mellitus, type 2 ([place]) 02/12/2015    Priority: High   Normocytic anemia 04/16/2021    Priority: Medium    Chronic kidney disease, stage 3a ([place]) 04/15/2021    Priority: Medium    Acid reflux 02/12/2015    Priority: Medium    Hyperlipidemia associated with type 2 diabetes mellitus ([place]) 02/12/2015    Priority: Medium    Primary hypertension 12/08/2011    Priority: Medium    GERD (gastroesophageal reflux disease) 03/03/2021    Priority: Low   Dermatitis, eczematoid 02/12/2015    Priority: Low   History of endometrial cancer 12/08/2011    Priority: Low   Past Surgical History:  Procedure Laterality Date   CHOLECYSTECTOMY     DILATATION & CURRETTAGE/HYSTEROSCOPY WITH RESECTOCOPE  11/30/2011   Procedure: DILATATION & CURETTAGE/HYSTEROSCOPY WITH RESECTOCOPE;  Surgeon: Peri Maris, MD;  Location: [facility];  Service: Gynecology;  Laterality: N/A;   DILATION AND CURETTAGE OF UTERUS     LYMPH NODE DISSECTION  12/14/2011   Procedure: LYMPH NODE DISSECTION;  Surgeon: Imagene Gurney A. Alycia Rossetti,  MD;  Location: [facility];  Service: Gynecology;  Laterality: N/A;   ROBOTIC ASSISTED TOTAL HYSTERECTOMY WITH BILATERAL SALPINGO OOPHERECTOMY  12/14/2011   Procedure: ROBOTIC ASSISTED TOTAL HYSTERECTOMY WITH BILATERAL SALPINGO OOPHORECTOMY;  Surgeon: Imagene Gurney A. Alycia Rossetti, MD;  Location: [facility];  Service: Gynecology;  Laterality: N/A;   WISDOM TOOTH EXTRACTION      Family History  Problem Relation Age of Onset   Heart disease Mother    Heart disease Father    Diabetes Sister        decline after fall   Other Sister        old age, delcined after husband loss   Diabetes Sister        later had fall   Cancer Paternal Aunt        started in arm reportedly    Medications- reviewed and updated Current Outpatient Medications  Medication Sig Dispense Refill   acetaminophen (TYLENOL) 500 MG tablet Take 500 mg by mouth every 6 (six) hours as needed for mild pain or moderate pain.     aspirin EC 81 MG tablet Take 81 mg by mouth daily. Swallow whole.     cyanocobalamin 1000 MCG tablet Take 1 tablet (1,000 mcg total) by mouth daily. 90 tablet 1   doxazosin (CARDURA) 2 MG tablet TAKE 1 TABLET AT BEDTIME 90 tablet 3   folic acid (FOLVITE) 1 MG  tablet Take 1 tablet (1 mg total) by mouth daily. 90 tablet 1   glimepiride (AMARYL) 4 MG tablet TAKE 1 TABLET TWICE DAILY 180 tablet 3   valsartan (DIOVAN) 80 MG tablet Take 0.5 tablets (40 mg total) by mouth daily. 45 tablet 3   famotidine (PEPCID) 20 MG tablet Take 1 tablet (20 mg total) by mouth 2 (two) times daily. 180 tablet 3   No current facility-administered medications for this visit.    Allergies-reviewed and updated Allergies  Allergen Reactions   Penicillin G Anaphylaxis   Doxycycline Hyclate Nausea Only   Shrimp [Shellfish Allergy] Nausea And Vomiting   Sinutab Sinus Max St [Phenylephrine-Acetaminophen] Swelling   Valacyclovir Other (See Comments)    Confusion    Social History   Social History Narrative   Married august 1985. 1 son and 1  grandson.       Retired from [employer]      Hobbies: movies, going out to eat   Objective  Objective:  BP (!) 140/70   Pulse 94   Temp (!) 97 F (36.1 C)   Ht 5\' 2"  (1.575 m)   Wt 173 lb 12.8 oz (78.8 kg)   LMP 01/18/2001   SpO2 95%   BMI 31.79 kg/m  Gen: NAD, resting comfortably HEENT: Mucous membranes are moist. Oropharynx normal. Declines ear exam- hearing aids in place Neck: no thyromegaly- do not feel nodule noted on ct cervical spine CV: RRR no murmurs rubs or gallops Lungs: CTAB no crackles, wheeze, rhonchi Abdomen: soft/nontender/nondistended/normal bowel sounds. No rebound or guarding.  Ext: no edema Skin: warm, dry Neuro: grossly normal, moves all extremities, PERRLA, has to stand slowly to gather her balance   Assessment and Plan   77 y.o. female presenting for annual physical.  Health Maintenance counseling: 1. Anticipatory guidance: Patient counseled regarding regular dental exams - advised q6 months- has not seen, eye exams - advised - needs to schedule,  avoiding smoking and second hand smoke , limiting alcohol to 1 beverage per day , no illicit drugs .   2. Risk factor reduction:  Advised patient of need for regular exercise and diet rich and fruits and vegetables to reduce risk of heart attack and stroke.  Exercise- hard with balance issues and Dr. Reatha Armour instructed to avoid gym.  Diet/weight management-up 2 lbs- feels needs to cut the sweets, got off track with holidays- needs to get back on track.  Wt Readings from Last 3 Encounters:  04/08/22 173 lb 12.8 oz (78.8 kg)  02/18/22 171 lb 12.8 oz (77.9 kg)  12/31/21 167 lb 6.4 oz (75.9 kg)   3. Immunizations/screenings/ancillary studies- not interested in any vaccinations including Tdap which she would need if gets cut/scrape  4. Cervical cancer screening- cervix removed for benign reasons and past age based screening recommendations- no bleeding or discharge 5. Breast cancer screening-   breast exam - self exams but firmly declines mammograms- she is aware of risk of misked breast cancer 6. Colon cancer screening - continues to decline colonoscopy and Cologuard and stool cards-aware of risk of colon cancer or death related to this 7. Skin cancer screening- sees Dr. Nevada Crane at least in past- is going to call to schedule. advised regular sunscreen use. Denies worrisome, changing, or new skin lesions.  8. Birth control/STD check- hysterectomy. Only active with her husband.  9. Osteoporosis screening at 65- declines bone density 10. Smoking associated screening - never smoker  Status of chronic or acute concerns    #  Cervical myelopathy due to C2-C7 cervical stenosis-follows with Dr. Reatha Armour currently being monitored every 6 months with last visit 11/23/2021 - scheduled in April   # Very poorly controlled diabetes S: Medication: Glimepiride 4 mg twice daily, has declined all other medications despite very poor control of diabetes- she is worried about risks to her kidneys- we discussed needs tighter diabetes control -Declines metformin worried about GI side effects -Declines Actos with family history of CHF -Declines insulin but may be possibly interested depending on husband's course with new start-rely on options may make this more cost effective -Declines diabetes education - Cost is a major concern so declines Ozempic and with prior UTI issues do not think Jardiance is a good choice plus would be costly - Did agree to [facility] consult then later declined Lab Results  Component Value Date   HGBA1C 8.7 (H) 10/30/2021   HGBA1C 8.7 (A) 06/18/2021   HGBA1C 8.9 (H) 03/03/2021  A/P: hoping for improvement- update a1c today- she agrees to work on diet but declines all other medicines or interventions- we will respect her decision (though it is not consistent with medical advice)   #hypertension S: medication:  doxazosin 2 mg, lisinopril 2.5 mg -prior on triamterene hctz but with  hospitalization thought dehydrating potentially and stopped in 2023 BP Readings from Last 3 Encounters:  04/08/22 (!) 140/70  02/18/22 (!) 150/70  12/31/21 (!) 156/62  A/P: blood pressure above goal- we are already switching to valsartan 40 mg to help with cough- if this does not improve blood pressure within a month may need higher dose  #hyperlipidemia S: Medication:none, patient not interested Lab Results  Component Value Date   CHOL 151 03/03/2021   HDL 44 03/03/2021   LDLCALC 79 03/03/2021   TRIG 140 03/03/2021   CHOLHDL 3.4 03/03/2021   A/P: firmly declines statin though this is strongly recommended with diabetes to reduce risk of heart attack, stroke, kidney failure, and death  # GERD S:Medication: famotidine 20 mg daily A/P: see below about cough- increase to BID   #scap rash- sees dermatology on mometasole solution for scalp - she will call Dr. Nevada Crane as needs refill  # Thyroid ultrasound recommended-1.6 cm incidental nodule previously noted with ultrasound recommended-she declined at last visit-offered again today- she declines but does allow me to check TSH   # Chronic cough-patient reports cough for the last 3 months-diagnosed with bronchitis in late November 2023 and treated with azithromycin initially and later cephalexin per Dr. Jonni Sanger in both cases -will get cxr  -she declines tessalon - is getting some allergies potentially- some watery eyes. Doesn't tolerate flonase. Could try allegra or zyrtec before bed - no history of asthma - reflux on famotidine once a day- we will trial twice a day -stop lisinopril and start valsartan 40 mg (half of 80 mg tablet)  Recommended follow up: Return in about 1 month (around 05/09/2022) for followup or sooner if needed.Schedule b4 you leave.  Lab/Order associations:NOT fasting   ICD-10-CM   1. Preventative health care  Z00.00     2. History of UTI  Z87.440 Urinalysis, Routine w reflex microscopic    3. Type 2 diabetes mellitus  with hyperglycemia, without long-term current use of insulin (HCC)  E11.65 CBC with Differential/Platelet    Comprehensive metabolic panel    Lipid panel    Hemoglobin A1c    Microalbumin / creatinine urine ratio    4. Chronic cough  R05.3 DG Chest 2 View    5. Hyperlipidemia  associated with type 2 diabetes mellitus (HCC)  E11.69 TSH   E78.5       Meds ordered this encounter  Medications   famotidine (PEPCID) 20 MG tablet    Sig: Take 1 tablet (20 mg total) by mouth 2 (two) times daily.    Dispense:  180 tablet    Refill:  3   valsartan (DIOVAN) 80 MG tablet    Sig: Take 0.5 tablets (40 mg total) by mouth daily.    Dispense:  45 tablet    Refill:  3   Return precautions advised.  Garret Reddish, MD

## 2022-04-19 DIAGNOSIS — G959 Disease of spinal cord, unspecified: Secondary | ICD-10-CM | POA: Diagnosis not present

## 2022-04-19 DIAGNOSIS — Z6831 Body mass index (BMI) 31.0-31.9, adult: Secondary | ICD-10-CM | POA: Diagnosis not present

## 2022-04-19 DIAGNOSIS — M4802 Spinal stenosis, cervical region: Secondary | ICD-10-CM | POA: Diagnosis not present

## 2022-05-20 ENCOUNTER — Encounter: Payer: Self-pay | Admitting: Family Medicine

## 2022-05-20 ENCOUNTER — Ambulatory Visit (INDEPENDENT_AMBULATORY_CARE_PROVIDER_SITE_OTHER): Payer: Medicare HMO | Admitting: Family Medicine

## 2022-05-20 VITALS — BP 142/80 | HR 103 | Temp 97.6°F | Ht 62.0 in | Wt 172.8 lb

## 2022-05-20 DIAGNOSIS — Z7984 Long term (current) use of oral hypoglycemic drugs: Secondary | ICD-10-CM

## 2022-05-20 DIAGNOSIS — R300 Dysuria: Secondary | ICD-10-CM | POA: Diagnosis not present

## 2022-05-20 DIAGNOSIS — R350 Frequency of micturition: Secondary | ICD-10-CM

## 2022-05-20 DIAGNOSIS — N898 Other specified noninflammatory disorders of vagina: Secondary | ICD-10-CM

## 2022-05-20 DIAGNOSIS — E1165 Type 2 diabetes mellitus with hyperglycemia: Secondary | ICD-10-CM | POA: Diagnosis not present

## 2022-05-20 LAB — POCT URINALYSIS DIPSTICK
Bilirubin, UA: NEGATIVE
Blood, UA: POSITIVE
Glucose, UA: POSITIVE — AB
Ketones, UA: NEGATIVE
Nitrite, UA: NEGATIVE
Protein, UA: POSITIVE — AB
Spec Grav, UA: 1.02 (ref 1.010–1.025)
Urobilinogen, UA: 0.2 E.U./dL
pH, UA: 5.5 (ref 5.0–8.0)

## 2022-05-20 MED ORDER — SULFAMETHOXAZOLE-TRIMETHOPRIM 800-160 MG PO TABS: 1.0000 | ORAL_TABLET | Freq: Two times a day (BID) | ORAL | 0 refills | Status: DC

## 2022-05-20 NOTE — Progress Notes (Signed)
Subjective   CC:  Chief Complaint  Patient presents with   Urinary Frequency    Pt stated that she has been having an urgency to urinate for the past couple of days alone with some itching     HPI: Meagan [name] is a 77 y.o. female who presents to the office today to address the problems listed above in the chief complaint. Patient reports a strange sensation when she voids and urinary frequency.  The symptoms have been ongoing for 2 days.  These are the typical symptoms of her urinary tract infections.  She has recurrent urinary tract infections documented by recent cultures.  Her last was in February.  She has sensation of increased urinary pressure.  She denies fevers flank pain nausea vomiting or gross hematuria.  She has mild itching but does not feel that she has a yeast infection.  She denies vaginal itching or vulvar redness.  She denies vaginal discharge.  No fevers or chills.  She is an uncontrolled diabetic.  I reviewed recent notes  Assessment  1. Urinary frequency   2. Type 2 diabetes mellitus with hyperglycemia, without long-term current use of insulin (HCC)   3. Vagina itching      Plan  Urinary frequency in a patient with recurrent UTIs: Mildly abnormal dipstick.  Septra ordered, patient elects to start now and await culture.  If culture proven positive, would likely need further evaluation for prophylaxis.  She will meet with her primary care doctor to discuss this if positive.  I recommend monitoring for yeast infection symptoms that progressed, over-the-counter Monistat or can call for Diflucan if needed. Discussed improving diabetic control.  Follow up: as needed  Orders Placed This Encounter  Procedures   Urine Culture   POCT Urinalysis Dipstick   Meds ordered this encounter  Medications   sulfamethoxazole-trimethoprim (BACTRIM DS) 800-160 MG tablet    Sig: Take 1 tablet by mouth 2 (two) times daily for 5 days.    Dispense:  10 tablet    Refill:  0       I reviewed the patients updated PMH, FH, and SocHx.    Patient Active Problem List   Diagnosis Date Noted   E coli bacteremia 04/17/2021   Normocytic anemia 04/16/2021   Chronic kidney disease, stage 3a (HCC) 04/15/2021   History of TIA (transient ischemic attack) 04/15/2021   GERD (gastroesophageal reflux disease) 03/03/2021   Dermatitis, eczematoid 02/12/2015   Diabetes mellitus, type 2 (HCC) 02/12/2015   Acid reflux 02/12/2015   Hyperlipidemia associated with type 2 diabetes mellitus (HCC) 02/12/2015   History of endometrial cancer 12/08/2011   Primary hypertension 12/08/2011   Current Meds  Medication Sig   acetaminophen (TYLENOL) 500 MG tablet Take 500 mg by mouth every 6 (six) hours as needed for mild pain or moderate pain.   aspirin EC 81 MG tablet Take 81 mg by mouth daily. Swallow whole.   cyanocobalamin 1000 MCG tablet Take 1 tablet (1,000 mcg total) by mouth daily.   doxazosin (CARDURA) 2 MG tablet TAKE 1 TABLET AT BEDTIME   famotidine (PEPCID) 20 MG tablet Take 1 tablet (20 mg total) by mouth 2 (two) times daily.   glimepiride (AMARYL) 4 MG tablet TAKE 1 TABLET TWICE DAILY   sulfamethoxazole-trimethoprim (BACTRIM DS) 800-160 MG tablet Take 1 tablet by mouth 2 (two) times daily for 5 days.   valsartan (DIOVAN) 80 MG tablet Take 0.5 tablets (40 mg total) by mouth daily.    Review of  Systems: Cardiovascular: negative for chest pain Respiratory: negative for SOB or persistent cough Gastrointestinal: negative for abdominal pain Constitutional: Negative for fever malaise or anorexia  Objective  Vitals: BP (!) 142/80   Pulse (!) 103   Temp 97.6 F (36.4 C)   Ht 5\' 2"  (1.575 m)   Wt 172 lb 12.8 oz (78.4 kg)   LMP 01/18/2001   SpO2 97%   BMI 31.61 kg/m  General: no acute distress  Psych:  Alert and oriented, normal mood and affect  Office Visit on 05/20/2022  Component Date Value Ref Range Status   Color, UA 05/20/2022 yellow   Final   Clarity, UA 05/20/2022  cloudy   Final   Glucose, UA 05/20/2022 Positive (A)  Negative Final   Bilirubin, UA 05/20/2022 negative   Final   Ketones, UA 05/20/2022 negative   Final   Spec Grav, UA 05/20/2022 1.020  1.010 - 1.025 Final   Blood, UA 05/20/2022 positive   Final   pH, UA 05/20/2022 5.5  5.0 - 8.0 Final   Protein, UA 05/20/2022 Positive (A)  Negative Final   Urobilinogen, UA 05/20/2022 0.2  0.2 or 1.0 E.U./dL Final   Nitrite, UA 16/10/9602 negative   Final   Leukocytes, UA 05/20/2022 Small (1+) (A)  Negative Final   Commons side effects, risks, benefits, and alternatives for medications and treatment plan prescribed today were discussed, and the patient expressed understanding of the given instructions. Patient is instructed to call or message via MyChart if he/she has any questions or concerns regarding our treatment plan. No barriers to understanding were identified. We discussed Red Flag symptoms and signs in detail. Patient expressed understanding regarding what to do in case of urgent or emergency type symptoms.  Medication list was reconciled, printed and provided to the patient in AVS. Patient instructions and summary information was reviewed with the patient as documented in the AVS. This note was prepared with assistance of Dragon voice recognition software. Occasional wrong-word or sound-a-like substitutions may have occurred due to the inherent limitations of voice recognition software

## 2022-05-20 NOTE — Patient Instructions (Signed)
Please follow up if symptoms do not improve or as needed.  Discuss your bladder infections with Dr. Durene Cal if your urine confirms infection this time.  Monitor for redness or vaginal itching as yeast infections are common in diabetics. Over the counter monistat can be used if needed.  I have ordered Septra to be taken twice daily for 5 days.  We will call you or send you a message with your urine culture results.

## 2022-05-21 ENCOUNTER — Other Ambulatory Visit: Payer: Self-pay | Admitting: Family Medicine

## 2022-05-23 LAB — URINE CULTURE
MICRO NUMBER:: 14905156
SPECIMEN QUALITY:: ADEQUATE

## 2022-05-24 ENCOUNTER — Encounter: Payer: Self-pay | Admitting: Family Medicine

## 2022-05-24 MED ORDER — NITROFURANTOIN MONOHYD MACRO 100 MG PO CAPS: 100.0000 mg | ORAL_CAPSULE | Freq: Two times a day (BID) | ORAL | 0 refills | Status: DC

## 2022-05-24 NOTE — Addendum Note (Signed)
Addended by: Asencion Partridge on: 05/24/2022 01:16 PM   Modules accepted: Orders

## 2022-07-03 ENCOUNTER — Other Ambulatory Visit: Payer: Self-pay | Admitting: Family Medicine

## 2022-07-26 ENCOUNTER — Other Ambulatory Visit: Payer: Self-pay | Admitting: Family Medicine

## 2022-07-27 ENCOUNTER — Ambulatory Visit (INDEPENDENT_AMBULATORY_CARE_PROVIDER_SITE_OTHER): Payer: Medicare HMO | Admitting: Family

## 2022-07-27 ENCOUNTER — Other Ambulatory Visit (HOSPITAL_COMMUNITY)
Admission: RE | Admit: 2022-07-27 | Discharge: 2022-07-27 | Disposition: A | Discharge status: Home / Self Care | Payer: Medicare HMO | Source: Ambulatory Visit | Attending: Family | Admitting: Family

## 2022-07-27 VITALS — BP 164/73 | HR 82 | Temp 97.2°F | Ht 62.0 in | Wt 175.0 lb

## 2022-07-27 DIAGNOSIS — N898 Other specified noninflammatory disorders of vagina: Secondary | ICD-10-CM | POA: Diagnosis not present

## 2022-07-27 DIAGNOSIS — R300 Dysuria: Secondary | ICD-10-CM

## 2022-07-27 DIAGNOSIS — N390 Urinary tract infection, site not specified: Secondary | ICD-10-CM | POA: Diagnosis not present

## 2022-07-27 LAB — POCT URINALYSIS DIPSTICK
Bilirubin, UA: NEGATIVE
Blood, UA: POSITIVE — AB
Glucose, UA: POSITIVE — AB
Ketones, UA: NEGATIVE
Leukocytes, UA: NEGATIVE
Nitrite, UA: POSITIVE — AB
Protein, UA: POSITIVE — AB
Spec Grav, UA: 1.02 (ref 1.010–1.025)
Urobilinogen, UA: 0.2 E.U./dL
pH, UA: 6 (ref 5.0–8.0)

## 2022-07-27 MED ORDER — FLUCONAZOLE 150 MG PO TABS
ORAL_TABLET | ORAL | 0 refills | Status: DC
Start: 2022-07-27 — End: 2022-09-30

## 2022-07-27 NOTE — Patient Instructions (Signed)
It was very nice to see you today!    I will review your lab results via MyChart in a few days.  I have sent Diflucan to your pharmacy to start - follow instructions on the bottle. If your lab result indicates BV instead of yeast I will send a different medication to your pharmacy.  Be sure you are drinking at least 2 liters = 64 oz of water every day!      PLEASE NOTE:  If you had any lab tests please let us know if you have not heard back within a few days. You may see your results on MyChart before we have a chance to review them but we will give you a call once they are reviewed by [place]. If we ordered any referrals today, please let us know if you have not heard from their office within the next week.

## 2022-07-27 NOTE — Progress Notes (Signed)
Patient ID: Meagan [name], female    DOB: 12-Jul-1945, 77 y.o.   MRN: [id number]  Chief Complaint  Patient presents with   Dysuria    Pt c/o dysuria, Present since Thursday. Has tried tylenol for the pain. Recurrent UTI's    HPI:      Urinary sx:  pt reports dysuria and vaginal itching for 5 days. States she doesn't normally have dysuria with her UTIs. Denies any vaginal discharge or foul odor. Denies hematuria, back pain (vs chronic pain), pelvic pain or fever.  Assessment & Plan:  1. Dysuria UA neg, sending for culture d/t pt hx of frequent UTIs, believe r/t vaginal yeast, checking swab. Advised on drinking at least 2L of water every day.  - POCT Urinalysis Dipstick - Urine Culture - Cervicovaginal ancillary only  2. Vaginal itching sending Diflucan based on sx, but checking aptima swab for confirmation & r/o BV, advised pt on use & SE. Call office if sx persist after finishing medication.  - fluconazole (DIFLUCAN) 150 MG tablet; Take 1 pill today and the 2nd pill in 3 days.  Dispense: 2 tablet; Refill: 0 - Cervicovaginal ancillary only  Subjective:    Outpatient Medications Prior to Visit  Medication Sig Dispense Refill   acetaminophen (TYLENOL) 500 MG tablet Take 500 mg by mouth every 6 (six) hours as needed for mild pain or moderate pain.     aspirin EC 81 MG tablet Take 81 mg by mouth daily. Swallow whole.     cyanocobalamin (VITAMIN B12) 1000 MCG tablet Take 1 tablet by mouth once daily 90 tablet 0   doxazosin (CARDURA) 2 MG tablet TAKE 1 TABLET AT BEDTIME 90 tablet 3   famotidine (PEPCID) 20 MG tablet Take 1 tablet (20 mg total) by mouth 2 (two) times daily. 180 tablet 3   folic acid (FOLVITE) 1 MG tablet Take 1 tablet (1 mg total) by mouth daily. 90 tablet 1   glimepiride (AMARYL) 4 MG tablet TAKE 1 TABLET TWICE DAILY 180 tablet 3   valsartan (DIOVAN) 80 MG tablet Take 0.5 tablets (40 mg total) by mouth daily. 45 tablet 3   nitrofurantoin, macrocrystal-monohydrate,  (MACROBID) 100 MG capsule Take 1 capsule (100 mg total) by mouth 2 (two) times daily. (Patient not taking: Reported on 07/27/2022) 10 capsule 0   No facility-administered medications prior to visit.   Past Medical History:  Diagnosis Date   Anemia    hx    Anxiety    no meds   Arthritis    knees   Cancer (HCC)    endometrial cancer   Diabetes mellitus without complication (HCC)    on oral medication   GERD (gastroesophageal reflux disease)    pepcid now/famotidine   Hypertension    SVD (spontaneous vaginal delivery)    x 1   UTI (lower urinary tract infection) 11/22/2011   started abx on 11/24/11   Past Surgical History:  Procedure Laterality Date   CHOLECYSTECTOMY     DILATATION & CURRETTAGE/HYSTEROSCOPY WITH RESECTOCOPE  11/30/2011   Procedure: DILATATION & CURETTAGE/HYSTEROSCOPY WITH RESECTOCOPE;  Surgeon: Alison Murray, MD;  Location: [facility];  Service: Gynecology;  Laterality: N/A;   DILATION AND CURETTAGE OF UTERUS     LYMPH NODE DISSECTION  12/14/2011   Procedure: LYMPH NODE DISSECTION;  Surgeon: Rejeana Brock A. Duard Brady, MD;  Location: [facility];  Service: Gynecology;  Laterality: N/A;   ROBOTIC ASSISTED TOTAL HYSTERECTOMY WITH BILATERAL SALPINGO OOPHERECTOMY  12/14/2011   Procedure: ROBOTIC ASSISTED TOTAL  HYSTERECTOMY WITH BILATERAL SALPINGO OOPHORECTOMY;  Surgeon: Rejeana Brock A. Duard Brady, MD;  Location: [facility];  Service: Gynecology;  Laterality: N/A;   WISDOM TOOTH EXTRACTION     Allergies  Allergen Reactions   Penicillin G Anaphylaxis   Doxycycline Hyclate Nausea Only   Shrimp [Shellfish Allergy] Nausea And Vomiting   Sinutab Sinus Max St [Phenylephrine-Acetaminophen] Swelling   Valacyclovir Other (See Comments)    Confusion      Objective:    Physical Exam Vitals and nursing note reviewed.  Constitutional:      Appearance: Normal appearance.  Cardiovascular:     Rate and Rhythm: Normal rate and regular rhythm.  Pulmonary:     Effort: Pulmonary effort is normal.      Breath sounds: Normal breath sounds.  Musculoskeletal:        General: Normal range of motion.  Skin:    General: Skin is warm and dry.  Neurological:     Mental Status: She is alert.  Psychiatric:        Mood and Affect: Mood normal.        Behavior: Behavior normal.   BP (!) 164/73   Pulse 82   Temp (!) 97.2 F (36.2 C) (Temporal)   Ht 5\' 2"  (1.575 m)   Wt 175 lb (79.4 kg)   LMP 01/18/2001   SpO2 95%   BMI 32.01 kg/m  Wt Readings from Last 3 Encounters:  07/27/22 175 lb (79.4 kg)  05/20/22 172 lb 12.8 oz (78.4 kg)  04/08/22 173 lb 12.8 oz (78.8 kg)      Dulce Sellar, NP

## 2022-07-28 LAB — CERVICOVAGINAL ANCILLARY ONLY
Bacterial Vaginitis (gardnerella): NEGATIVE
Comment: NEGATIVE
Comment: NEGATIVE
Comment: NEGATIVE

## 2022-07-28 NOTE — Progress Notes (Signed)
Let Meagan [name] know her vaginal swab turned out negative or BV but it was not a good enough sample to check for yeast, but I sent in Diflucan for yeast and hopefully she is feeling better with this. Thx

## 2022-07-29 LAB — URINE CULTURE
MICRO NUMBER:: 15176727
SPECIMEN QUALITY:: ADEQUATE

## 2022-07-29 MED ORDER — CEPHALEXIN 500 MG PO CAPS
500.0000 mg | ORAL_CAPSULE | Freq: Two times a day (BID) | ORAL | 0 refills | Status: DC
Start: 2022-07-29 — End: 2022-09-30

## 2022-07-29 NOTE — Addendum Note (Signed)
Addended byDulce Sellar on: 07/29/2022 08:35 PM   Modules accepted: Orders

## 2022-09-28 NOTE — Progress Notes (Signed)
Meagan [name] is a 77 y.o. female here for a recurrence of a previously resolved problem.  History of Present Illness:   No chief complaint on file.   HPI UTI Symptoms: Complains of *** that began *** ago  ***  ***  ***   Past Medical History:  Diagnosis Date   Anemia    hx    Anxiety    no meds   Arthritis    knees   Cancer (HCC)    endometrial cancer   Diabetes mellitus without complication (HCC)    on oral medication   GERD (gastroesophageal reflux disease)    pepcid now/famotidine   Hypertension    SVD (spontaneous vaginal delivery)    x 1   UTI (lower urinary tract infection) 11/22/2011   started abx on 11/24/11     Social History   Tobacco Use   Smoking status: Never   Smokeless tobacco: Never  Vaping Use   Vaping status: Never Used  Substance Use Topics   Alcohol use: No    Alcohol/week: 0.0 standard drinks of alcohol   Drug use: No    Past Surgical History:  Procedure Laterality Date   CHOLECYSTECTOMY     DILATATION & CURRETTAGE/HYSTEROSCOPY WITH RESECTOCOPE  11/30/2011   Procedure: DILATATION & CURETTAGE/HYSTEROSCOPY WITH RESECTOCOPE;  Surgeon: Alison Murray, MD;  Location: [facility];  Service: Gynecology;  Laterality: N/A;   DILATION AND CURETTAGE OF UTERUS     LYMPH NODE DISSECTION  12/14/2011   Procedure: LYMPH NODE DISSECTION;  Surgeon: Rejeana Brock A. Duard Brady, MD;  Location: [facility];  Service: Gynecology;  Laterality: N/A;   ROBOTIC ASSISTED TOTAL HYSTERECTOMY WITH BILATERAL SALPINGO OOPHERECTOMY  12/14/2011   Procedure: ROBOTIC ASSISTED TOTAL HYSTERECTOMY WITH BILATERAL SALPINGO OOPHORECTOMY;  Surgeon: Rejeana Brock A. Duard Brady, MD;  Location: [facility];  Service: Gynecology;  Laterality: N/A;   WISDOM TOOTH EXTRACTION      Family History  Problem Relation Age of Onset   Heart disease Mother    Heart disease Father    Diabetes Sister        decline after fall   Other Sister        old age, delcined after husband loss   Diabetes Sister        later  had fall   Cancer Paternal Aunt        started in arm reportedly    Allergies  Allergen Reactions   Penicillin G Anaphylaxis   Doxycycline Hyclate Nausea Only   Levaquin [Levofloxacin]    Shrimp [Shellfish Allergy] Nausea And Vomiting   Sinutab Sinus Max St [Phenylephrine-Acetaminophen] Swelling   Valacyclovir Other (See Comments)    Confusion    Current Medications:   Current Outpatient Medications:    acetaminophen (TYLENOL) 500 MG tablet, Take 500 mg by mouth every 6 (six) hours as needed for mild pain or moderate pain., Disp: , Rfl:    aspirin EC 81 MG tablet, Take 81 mg by mouth daily. Swallow whole., Disp: , Rfl:    cephALEXin (KEFLEX) 500 MG capsule, Take 1 capsule (500 mg total) by mouth 2 (two) times daily., Disp: 14 capsule, Rfl: 0   cyanocobalamin (VITAMIN B12) 1000 MCG tablet, Take 1 tablet by mouth once daily, Disp: 90 tablet, Rfl: 0   doxazosin (CARDURA) 2 MG tablet, TAKE 1 TABLET AT BEDTIME, Disp: 90 tablet, Rfl: 3   famotidine (PEPCID) 20 MG tablet, Take 1 tablet (20 mg total) by mouth 2 (two) times daily., Disp: 180 tablet, Rfl:  3   fluconazole (DIFLUCAN) 150 MG tablet, Take 1 pill today and the 2nd pill in 3 days., Disp: 2 tablet, Rfl: 0   folic acid (FOLVITE) 1 MG tablet, Take 1 tablet (1 mg total) by mouth daily., Disp: 90 tablet, Rfl: 1   glimepiride (AMARYL) 4 MG tablet, TAKE 1 TABLET TWICE DAILY, Disp: 180 tablet, Rfl: 3   nitrofurantoin, macrocrystal-monohydrate, (MACROBID) 100 MG capsule, Take 1 capsule (100 mg total) by mouth 2 (two) times daily. (Patient not taking: Reported on 07/27/2022), Disp: 10 capsule, Rfl: 0   valsartan (DIOVAN) 80 MG tablet, Take 0.5 tablets (40 mg total) by mouth daily., Disp: 45 tablet, Rfl: 3   Review of Systems:   ROS  Vitals:   There were no vitals filed for this visit.   There is no height or weight on file to calculate BMI.  Physical Exam:   Physical Exam  Assessment and Plan:   There are no diagnoses linked to  this encounter.        I,Emily Lagle,acting as a Neurosurgeon for [employer], PA.,have documented all relevant documentation on the behalf of Jarold Motto, PA,as directed by  Jarold Motto, PA while in the presence of Jarold Motto, [place].  *** I, Larey Brick, have reviewed all documentation for this visit. The documentation on 09/28/22 for the exam, diagnosis, procedures, and orders are all accurate and complete.  Jarold Motto, PA-C

## 2022-09-30 ENCOUNTER — Ambulatory Visit (INDEPENDENT_AMBULATORY_CARE_PROVIDER_SITE_OTHER): Payer: Medicare HMO | Admitting: Physician Assistant

## 2022-09-30 ENCOUNTER — Encounter: Payer: Self-pay | Admitting: Physician Assistant

## 2022-09-30 VITALS — BP 122/72 | HR 81 | Temp 97.1°F | Ht 62.0 in | Wt 176.0 lb

## 2022-09-30 DIAGNOSIS — N898 Other specified noninflammatory disorders of vagina: Secondary | ICD-10-CM

## 2022-09-30 DIAGNOSIS — N1831 Chronic kidney disease, stage 3a: Secondary | ICD-10-CM

## 2022-09-30 DIAGNOSIS — R300 Dysuria: Secondary | ICD-10-CM

## 2022-09-30 DIAGNOSIS — I10 Essential (primary) hypertension: Secondary | ICD-10-CM | POA: Diagnosis not present

## 2022-09-30 LAB — COMPREHENSIVE METABOLIC PANEL
ALT: 23 U/L (ref 0–35)
AST: 18 U/L (ref 0–37)
Albumin: 3.5 g/dL (ref 3.5–5.2)
Alkaline Phosphatase: 125 U/L — ABNORMAL HIGH (ref 39–117)
BUN: 29 mg/dL — ABNORMAL HIGH (ref 6–23)
CO2: 25 meq/L (ref 19–32)
Calcium: 9.3 mg/dL (ref 8.4–10.5)
Chloride: 96 meq/L (ref 96–112)
Creatinine, Ser: 1.22 mg/dL — ABNORMAL HIGH (ref 0.40–1.20)
GFR: 43.28 mL/min — ABNORMAL LOW (ref >60.00)
Glucose, Bld: 353 mg/dL — ABNORMAL HIGH (ref 70–99)
Potassium: 4.2 meq/L (ref 3.5–5.1)
Sodium: 131 meq/L — ABNORMAL LOW (ref 135–145)
Total Bilirubin: 0.4 mg/dL (ref 0.2–1.2)
Total Protein: 7.6 g/dL (ref 6.0–8.3)

## 2022-09-30 LAB — POCT URINALYSIS DIPSTICK
Bilirubin, UA: NEGATIVE
Blood, UA: NEGATIVE
Glucose, UA: POSITIVE — AB
Ketones, UA: 1
Nitrite, UA: NEGATIVE
Protein, UA: POSITIVE — AB
Spec Grav, UA: 1.02 (ref 1.010–1.025)
Urobilinogen, UA: 0.2 U/dL
pH, UA: 5 (ref 5.0–8.0)

## 2022-09-30 MED ORDER — FLUCONAZOLE 150 MG PO TABS: 150.0000 mg | ORAL_TABLET | Freq: Once | ORAL | 0 refills | Status: AC

## 2022-09-30 MED ORDER — VITAMIN B-12 1000 MCG PO TABS: 1000.0000 ug | ORAL_TABLET | Freq: Every day | ORAL | 0 refills | Status: DC

## 2022-09-30 MED ORDER — CEPHALEXIN 500 MG PO CAPS: 500.0000 mg | ORAL_CAPSULE | Freq: Two times a day (BID) | ORAL | 0 refills | Status: DC

## 2022-09-30 NOTE — Patient Instructions (Signed)
It was great to see you!  Start cephalexin for your suspected urinary tract infection Take the fluconazole tablet for suspected yeast  I will be in touch with your kidney function results   General instructions Make sure you: Pee until your bladder is empty. Do not hold pee for a long time. Empty your bladder after sex. Wipe from front to back after pooping if you are a female. Use each tissue one time when you wipe. Drink enough fluid to keep your pee pale yellow. Keep all follow-up visits as told by your doctor. This is important. Contact a doctor if: You do not get better after 1-2 days. Your symptoms go away and then come back. Get help right away if: You have very bad back pain. You have very bad pain in your lower belly. You have a fever. You are sick to your stomach (nauseous). You are throwing up.   Take care,  Jarold Motto PA-C

## 2022-10-03 LAB — URINE CULTURE
MICRO NUMBER:: 15458244
SPECIMEN QUALITY:: ADEQUATE

## 2022-10-22 ENCOUNTER — Other Ambulatory Visit: Payer: Self-pay | Admitting: Family Medicine

## 2022-10-22 DIAGNOSIS — Z1212 Encounter for screening for malignant neoplasm of rectum: Secondary | ICD-10-CM

## 2022-10-22 DIAGNOSIS — Z1211 Encounter for screening for malignant neoplasm of colon: Secondary | ICD-10-CM

## 2022-11-26 ENCOUNTER — Ambulatory Visit (INDEPENDENT_AMBULATORY_CARE_PROVIDER_SITE_OTHER): Payer: Medicare HMO | Admitting: Family Medicine

## 2022-11-26 ENCOUNTER — Encounter: Payer: Self-pay | Admitting: Family Medicine

## 2022-11-26 VITALS — BP 140/72 | HR 87 | Temp 97.0°F | Ht 62.0 in | Wt 174.8 lb

## 2022-11-26 DIAGNOSIS — E1165 Type 2 diabetes mellitus with hyperglycemia: Secondary | ICD-10-CM | POA: Diagnosis not present

## 2022-11-26 DIAGNOSIS — Z7984 Long term (current) use of oral hypoglycemic drugs: Secondary | ICD-10-CM | POA: Diagnosis not present

## 2022-11-26 DIAGNOSIS — N390 Urinary tract infection, site not specified: Secondary | ICD-10-CM

## 2022-11-26 DIAGNOSIS — I10 Essential (primary) hypertension: Secondary | ICD-10-CM | POA: Diagnosis not present

## 2022-11-26 LAB — POCT GLYCOSYLATED HEMOGLOBIN (HGB A1C): Hemoglobin A1C: 10.8 % — AB (ref 4.0–5.6)

## 2022-11-26 MED ORDER — VALSARTAN 80 MG PO TABS: 80.0000 mg | ORAL_TABLET | Freq: Every day | ORAL | 5 refills | Status: DC

## 2022-11-26 MED ORDER — METHENAMINE HIPPURATE 1 G PO TABS: 1.0000 g | ORAL_TABLET | Freq: Two times a day (BID) | ORAL | 5 refills | Status: DC

## 2022-11-26 NOTE — Patient Instructions (Addendum)
You are eligible to schedule your annual wellness visit with our nurse specialist Inetta Fermo.  Please consider scheduling this before you leave today  Diabetes very poorly controlled- discussed endocrine, insulin, Ozempic, etc- you declined all for now and want to work on changing diet- I do not think this is going to be enough but I am certainly rooting for you  On Monday- schedule a lab visit- and bring me a copy of your home blood pressure- with these pieces of data I can decide on going up on valsartan to 80 mg - for now continue half tablet or 40 mg. Increase your fluids to protect your kidneys  Start methenamine daily to reduce risk of urinary tract infection  Recommended follow up: Return in about 14 weeks (around 03/04/2023) for followup or sooner if needed.Schedule b4 you leave.

## 2022-11-26 NOTE — Progress Notes (Signed)
Phone [phone] In person visit   Subjective:   Meagan [name] is a 77 y.o. year old very pleasant female patient who presents for/with See problem oriented charting Chief Complaint  Patient presents with   Medical Management of Chronic Issues   Hypertension    Wants to increase valsartan.   antibiotic    Pt states she was told to see if you will Rx her antibiotics to take daily for her UTI's (does not have urologist)   Past Medical History-  Patient Active Problem List   Diagnosis Date Noted   E coli bacteremia 04/17/2021    Priority: High   History of TIA (transient ischemic attack) 04/15/2021    Priority: High   Diabetes mellitus, type 2 (HCC) 02/12/2015    Priority: High   Normocytic anemia 04/16/2021    Priority: Medium    Acid reflux 02/12/2015    Priority: Medium    Hyperlipidemia associated with type 2 diabetes mellitus (HCC) 02/12/2015    Priority: Medium    Primary hypertension 12/08/2011    Priority: Medium    GERD (gastroesophageal reflux disease) 03/03/2021    Priority: Low   Dermatitis, eczematoid 02/12/2015    Priority: Low   History of endometrial cancer 12/08/2011    Priority: Low   Medications- reviewed and updated Current Outpatient Medications  Medication Sig Dispense Refill   acetaminophen (TYLENOL) 500 MG tablet Take 500 mg by mouth every 6 (six) hours as needed for mild pain or moderate pain.     aspirin EC 81 MG tablet Take 81 mg by mouth daily. Swallow whole.     cyanocobalamin (VITAMIN B12) 1000 MCG tablet Take 1 tablet (1,000 mcg total) by mouth daily. 90 tablet 0   doxazosin (CARDURA) 2 MG tablet TAKE 1 TABLET AT BEDTIME 90 tablet 3   famotidine (PEPCID) 20 MG tablet Take 1 tablet (20 mg total) by mouth 2 (two) times daily. 180 tablet 3   glimepiride (AMARYL) 4 MG tablet TAKE 1 TABLET TWICE DAILY 180 tablet 3   methenamine (HIPREX) 1 g tablet Take 1 tablet (1 g total) by mouth 2 (two) times daily with a meal. 60 tablet 5   valsartan  (DIOVAN) 80 MG tablet Take 0.5 tablets (40 mg total) by mouth daily. 45 tablet 3   valsartan (DIOVAN) 80 MG tablet Take 1 tablet (80 mg total) by mouth daily. 30 tablet 5   No current facility-administered medications for this visit.     Objective:  BP (!) 140/72   Pulse 87   Temp (!) 97 F (36.1 C)   Ht 5\' 2"  (1.575 m)   Wt 174 lb 12.8 oz (79.3 kg)   LMP 01/18/2001   SpO2 97%   BMI 31.97 kg/m  Gen: NAD, resting comfortably CV: RRR no murmurs rubs or gallops Lungs: CTAB no crackles, wheeze, rhonchi     Assessment and Plan   # Cervical myelopathy history-patient reports no recent issues.  Occasional left arm pain  # Very poorly controlled diabetes S: Medication: Glimepiride 4 mg twice daily, has declined all other medications despite very poor control of diabetes -Declines metformin worried about GI side effects -Declines Actos with family history of CHF -Declines insulin but may be possibly interested depending on husband's course with new start-rely on options may make this more cost effective -Declines diabetes education - Cost is a major concern so declines Ozempic and with prior UTI issues do not think Jardiance is a good choice plus would be costly -  Did agree to [facility] consult then later declined  CBGs- 200's typically Exercise and diet- reports eating too many sweets Lab Results  Component Value Date   HGBA1C 10.8 (A) 11/26/2022   HGBA1C 8.7 (H) 04/08/2022   HGBA1C 8.7 (H) 10/30/2021   A/P: diabetes very poorly controlled -reviewed all medication choices again and she declines -she wants to work on diet only -declines endocrine visit and my chief suggestion of insulin (think we could get this affordable)   #hypertension S: medication:  doxazosin 2 mg, valsartan 80 mg- but only takes half tablet -prior on triamterene hctz but with hospitalization thought dehydrating potentially and stopped in 2023 Home readings #s: has cuff but not using BP Readings from Last 3  Encounters:  11/26/22 (!) 140/72  09/30/22 122/72  07/27/22 (!) 164/73  A/P: hypertension is mild poorly controlled- we had wanted to increase valsartan to 160 mg but I need to know how her pressure is on the 80 mg dose - on Monday I want her to bring in her readings from the weekend and also get a kidney function test that day to help me decide -Her urine microalbumin creatinine ratio was elevated previously so hoping to stabilize this-we really need to control her A1c and blood pressure and maxed out irbesartan-she is not interested in medication like Jardiance due to concern about UTI as below  recurrent UTI S: Patient with a E. coli UTI on 09/30/2022 resistant to ampicillin, gentamicin, Bactrim -Also with E. coli UTI on 07/27/2022 resistant to ampicillin, gentamicin, Bactrim and intermediate to ampicillin/sulbactam -Also with E. coli UTI in may 2024  resistant to ampicillin, ampicillin sulbactam, gentamicin, Bactrim with intermediate resistant to tobramycin A/P: Recurrent UTI issues for some time-we discussed how poorly controlled diabetes could contribute as well as the importance of hydration particular with her sugars being so high -Also discussed trial of methenamine-she opts in for this-counseling provided on side effects -She reports symptoms started after hysterectomy and offered referral back to GYN for evaluation and consideration of vaginal estrogen-she declines at this time  Recommended follow up: Return in about 14 weeks (around 03/04/2023) for followup or sooner if needed.Schedule b4 you leave.  Lab/Order associations:   ICD-10-CM   1. Type 2 diabetes mellitus with hyperglycemia, without long-term current use of insulin (HCC)  E11.65 POCT HgB A1C    Comp Met (CMET)    2. Primary hypertension  I10     3. Recurrent UTI  N39.0       Meds ordered this encounter  Medications   methenamine (HIPREX) 1 g tablet    Sig: Take 1 tablet (1 g total) by mouth 2 (two) times daily with a  meal.    Dispense:  60 tablet    Refill:  5   valsartan (DIOVAN) 80 MG tablet    Sig: Take 1 tablet (80 mg total) by mouth daily.    Dispense:  30 tablet    Refill:  5    Return precautions advised.  Tana Conch, MD

## 2022-12-21 DIAGNOSIS — E119 Type 2 diabetes mellitus without complications: Secondary | ICD-10-CM | POA: Diagnosis not present

## 2023-01-03 ENCOUNTER — Other Ambulatory Visit (INDEPENDENT_AMBULATORY_CARE_PROVIDER_SITE_OTHER): Payer: Medicare HMO

## 2023-01-03 DIAGNOSIS — E1165 Type 2 diabetes mellitus with hyperglycemia: Secondary | ICD-10-CM

## 2023-01-04 LAB — COMPREHENSIVE METABOLIC PANEL
ALT: 23 U/L (ref 0–35)
AST: 20 U/L (ref 0–37)
Albumin: 3.6 g/dL (ref 3.5–5.2)
Alkaline Phosphatase: 104 U/L (ref 39–117)
BUN: 23 mg/dL (ref 6–23)
CO2: 25 meq/L (ref 19–32)
Calcium: 9.4 mg/dL (ref 8.4–10.5)
Chloride: 100 meq/L (ref 96–112)
Creatinine, Ser: 0.95 mg/dL (ref 0.40–1.20)
GFR: 58.32 mL/min — ABNORMAL LOW (ref >60.00)
Glucose, Bld: 153 mg/dL — ABNORMAL HIGH (ref 70–99)
Potassium: 4.4 meq/L (ref 3.5–5.1)
Sodium: 134 meq/L — ABNORMAL LOW (ref 135–145)
Total Bilirubin: 0.5 mg/dL (ref 0.2–1.2)
Total Protein: 7.7 g/dL (ref 6.0–8.3)

## 2023-01-05 DIAGNOSIS — H9212 Otorrhea, left ear: Secondary | ICD-10-CM | POA: Diagnosis not present

## 2023-01-05 DIAGNOSIS — H7292 Unspecified perforation of tympanic membrane, left ear: Secondary | ICD-10-CM | POA: Diagnosis not present

## 2023-02-02 DIAGNOSIS — H7292 Unspecified perforation of tympanic membrane, left ear: Secondary | ICD-10-CM | POA: Diagnosis not present

## 2023-02-02 DIAGNOSIS — H9212 Otorrhea, left ear: Secondary | ICD-10-CM | POA: Diagnosis not present

## 2023-02-11 ENCOUNTER — Other Ambulatory Visit: Payer: Self-pay | Admitting: Family Medicine

## 2023-02-17 DIAGNOSIS — H25812 Combined forms of age-related cataract, left eye: Secondary | ICD-10-CM | POA: Diagnosis not present

## 2023-03-01 DIAGNOSIS — H7292 Unspecified perforation of tympanic membrane, left ear: Secondary | ICD-10-CM | POA: Diagnosis not present

## 2023-03-01 DIAGNOSIS — H9212 Otorrhea, left ear: Secondary | ICD-10-CM | POA: Diagnosis not present

## 2023-03-08 DIAGNOSIS — H2512 Age-related nuclear cataract, left eye: Secondary | ICD-10-CM | POA: Diagnosis not present

## 2023-04-01 ENCOUNTER — Telehealth: Payer: Self-pay

## 2023-04-01 DIAGNOSIS — E1165 Type 2 diabetes mellitus with hyperglycemia: Secondary | ICD-10-CM

## 2023-04-01 NOTE — Telephone Encounter (Signed)
 Patient was identified as falling into the [place] Measure - Diabetes.   Patient was: Appointment scheduled for lab or office visit for A1c.

## 2023-04-05 ENCOUNTER — Other Ambulatory Visit (INDEPENDENT_AMBULATORY_CARE_PROVIDER_SITE_OTHER)

## 2023-04-05 ENCOUNTER — Encounter: Payer: Self-pay | Admitting: Family Medicine

## 2023-04-05 DIAGNOSIS — E1165 Type 2 diabetes mellitus with hyperglycemia: Secondary | ICD-10-CM

## 2023-04-05 LAB — HEMOGLOBIN A1C: Hgb A1c MFr Bld: 8.1 % — ABNORMAL HIGH (ref 4.6–6.5)

## 2023-04-12 ENCOUNTER — Ambulatory Visit (INDEPENDENT_AMBULATORY_CARE_PROVIDER_SITE_OTHER): Admitting: Family Medicine

## 2023-04-12 ENCOUNTER — Encounter: Payer: Self-pay | Admitting: Family Medicine

## 2023-04-12 VITALS — BP 140/60 | HR 85 | Temp 98.1°F | Ht 62.0 in | Wt 169.6 lb

## 2023-04-12 DIAGNOSIS — Z7984 Long term (current) use of oral hypoglycemic drugs: Secondary | ICD-10-CM

## 2023-04-12 DIAGNOSIS — E1169 Type 2 diabetes mellitus with other specified complication: Secondary | ICD-10-CM | POA: Diagnosis not present

## 2023-04-12 DIAGNOSIS — E785 Hyperlipidemia, unspecified: Secondary | ICD-10-CM | POA: Diagnosis not present

## 2023-04-12 DIAGNOSIS — E1165 Type 2 diabetes mellitus with hyperglycemia: Secondary | ICD-10-CM

## 2023-04-12 DIAGNOSIS — N1831 Chronic kidney disease, stage 3a: Secondary | ICD-10-CM

## 2023-04-12 DIAGNOSIS — I10 Essential (primary) hypertension: Secondary | ICD-10-CM | POA: Diagnosis not present

## 2023-04-12 MED ORDER — VALSARTAN 80 MG PO TABS: 80.0000 mg | ORAL_TABLET | Freq: Every day | ORAL | 3 refills | Status: DC

## 2023-04-12 NOTE — Patient Instructions (Addendum)
 Get diabetic eye exam scheduled.  blood pressure poorly controlled- increase valsartan to 80 mg and follow up in 14 weeks  Congrats on huge a1c improvement- keep up the great work and you also wanted to cut out sweet tea which will help even further  Schedule labs before next visit 07/07/23 at the earliest then see me a day or two later  Recommended follow up: Return in about 14 weeks (around 07/19/2023) for followup or sooner if needed.Schedule b4 you leave.

## 2023-04-12 NOTE — Progress Notes (Signed)
 Phone [phone] In person visit   Subjective:   Meagan [name] is a 78 y.o. year old very pleasant female patient who presents for/with See problem oriented charting Chief Complaint  Patient presents with   Medical Management of Chronic Issues   Diabetes    Pt is here for 14 week f/u.   Past Medical History-  Patient Active Problem List   Diagnosis Date Noted   E coli bacteremia 04/17/2021    Priority: High   History of TIA (transient ischemic attack) 04/15/2021    Priority: High   Diabetes mellitus, type 2 (HCC) 02/12/2015    Priority: High   Normocytic anemia 04/16/2021    Priority: Medium    Acid reflux 02/12/2015    Priority: Medium    Hyperlipidemia associated with type 2 diabetes mellitus (HCC) 02/12/2015    Priority: Medium    Primary hypertension 12/08/2011    Priority: Medium    GERD (gastroesophageal reflux disease) 03/03/2021    Priority: Low   Dermatitis, eczematoid 02/12/2015    Priority: Low   History of endometrial cancer 12/08/2011    Priority: Low    Medications- reviewed and updated Current Outpatient Medications  Medication Sig Dispense Refill   acetaminophen (TYLENOL) 500 MG tablet Take 500 mg by mouth every 6 (six) hours as needed for mild pain or moderate pain.     aspirin EC 81 MG tablet Take 81 mg by mouth daily. Swallow whole.     cyanocobalamin (VITAMIN B12) 1000 MCG tablet Take 1 tablet (1,000 mcg total) by mouth daily. 90 tablet 0   doxazosin (CARDURA) 2 MG tablet TAKE 1 TABLET AT BEDTIME 90 tablet 3   famotidine (PEPCID) 20 MG tablet TAKE 1 TABLET TWICE DAILY 180 tablet 3   glimepiride (AMARYL) 4 MG tablet TAKE 1 TABLET TWICE DAILY 180 tablet 3   methenamine (HIPREX) 1 g tablet Take 1 tablet (1 g total) by mouth 2 (two) times daily with a meal. 60 tablet 5   valsartan (DIOVAN) 80 MG tablet Take 1 tablet (80 mg total) by mouth daily. 90 tablet 3   No current facility-administered medications for this visit.     Objective:  BP (!)  140/60   Pulse 85   Temp 98.1 F (36.7 C)   Ht 5\' 2"  (1.575 m)   Wt 169 lb 9.6 oz (76.9 kg)   LMP 01/18/2001   SpO2 94%   BMI 31.02 kg/m  Gen: NAD, resting comfortably CV: RRR no murmurs rubs or gallops Lungs: CTAB no crackles, wheeze, rhonchi Ext: no edema Skin: warm, dry     Assessment and Plan   # Cervical myelopathy due to C2-C7 cervical stenosis-follows with Dr. Jake Samples currently being monitored every 6 months - has upcoming visit  # Very poorly controlled diabetes S: Medication: Glimepiride 4 mg twice daily, has declined all other medications despite very poor control of diabetes. She's actually taking 3 total per day- advised max 2 a day -Declines metformin worried about GI side effects -Declines Actos with family history of CHF -Declines insulin but may be possibly interested depending on husband's course with new start-rely on options may make this more cost effective -Declines diabetes education - Cost is a major concern so declines Ozempic and with prior UTI issues do not think Jardiance is a good choice plus would be costly - Did agree to [facility] consult then later declined  CBGs- 190 this morning- but has been lower- had eaten out the day before Exercise and  diet- difficult to exercise due to cervical myelopathy. Still drinking sweet tea Lab Results  Component Value Date   HGBA1C 8.1 (H) 04/05/2023   HGBA1C 10.8 (A) 11/26/2022   HGBA1C 8.7 (H) 04/08/2022  A/P: a1c much improved but still above goal- she wants to continue current medications but I advised limiting to 8 mg total per day -try to cut out/down sweet tea  I discussed the microalbumin to creatinine lab error with patient that occurred with [employer] labs.  Essentially the ratio was off by a factor of 10.  In this patient's individual case with correction  her last value would be 987 up from 789 up from 35 time before that- she has started valsartan but we are going to try to increase dose as tolerated- try 80  mg this time and follow up In 14 weeks  #hypertension S: medication:  doxazosin 2 mg, valsartan 40 mg  -prior on triamterene hctz but with hospitalization thought dehydrating potentially and stopped in 2023 BP Readings from Last 3 Encounters:  04/12/23 (!) 140/60  11/26/22 (!) 140/72  09/30/22 122/72  A/P: blood pressure poorly controlled- increase valsartan to 80 mg and follow up in 14 weeks   #lipids above goal- wants to recheck next visit  #HM- bone density opts out- would not be interested in medicine  Recommended follow up: Return in about 14 weeks (around 07/19/2023) for followup or sooner if needed.Schedule b4 you leave. Labs before visit   Lab/Order associations:   ICD-10-CM   1. Type 2 diabetes mellitus with hyperglycemia, without long-term current use of insulin (HCC)  E11.65 Hemoglobin A1c    Microalbumin / creatinine urine ratio    2. Primary hypertension  I10     3. Hyperlipidemia associated with type 2 diabetes mellitus (HCC)  E11.69 Comprehensive metabolic panel   Z61.0 CBC with Differential/Platelet    Lipid panel      Meds ordered this encounter  Medications   valsartan (DIOVAN) 80 MG tablet    Sig: Take 1 tablet (80 mg total) by mouth daily.    Dispense:  90 tablet    Refill:  3    Return precautions advised.  Tana Conch, MD

## 2023-04-23 ENCOUNTER — Other Ambulatory Visit: Payer: Self-pay | Admitting: Family Medicine

## 2023-04-26 DIAGNOSIS — H2512 Age-related nuclear cataract, left eye: Secondary | ICD-10-CM | POA: Diagnosis not present

## 2023-04-26 DIAGNOSIS — H25812 Combined forms of age-related cataract, left eye: Secondary | ICD-10-CM | POA: Diagnosis not present

## 2023-05-12 DIAGNOSIS — G959 Disease of spinal cord, unspecified: Secondary | ICD-10-CM | POA: Diagnosis not present

## 2023-06-03 ENCOUNTER — Other Ambulatory Visit: Payer: Self-pay | Admitting: Family Medicine

## 2023-06-03 DIAGNOSIS — H2511 Age-related nuclear cataract, right eye: Secondary | ICD-10-CM | POA: Diagnosis not present

## 2023-06-07 DIAGNOSIS — H25811 Combined forms of age-related cataract, right eye: Secondary | ICD-10-CM | POA: Diagnosis not present

## 2023-06-07 DIAGNOSIS — H2511 Age-related nuclear cataract, right eye: Secondary | ICD-10-CM | POA: Diagnosis not present

## 2023-06-24 ENCOUNTER — Encounter: Payer: Self-pay | Admitting: Family Medicine

## 2023-06-24 ENCOUNTER — Ambulatory Visit (INDEPENDENT_AMBULATORY_CARE_PROVIDER_SITE_OTHER): Admitting: Family

## 2023-06-24 VITALS — BP 129/65 | HR 75 | Temp 97.5°F | Resp 18 | Ht 62.0 in | Wt 174.5 lb

## 2023-06-24 DIAGNOSIS — J208 Acute bronchitis due to other specified organisms: Secondary | ICD-10-CM | POA: Diagnosis not present

## 2023-06-24 DIAGNOSIS — B9689 Other specified bacterial agents as the cause of diseases classified elsewhere: Secondary | ICD-10-CM | POA: Diagnosis not present

## 2023-06-24 MED ORDER — AZITHROMYCIN 250 MG PO TABS: ORAL_TABLET | ORAL | 0 refills | Status: AC

## 2023-06-24 NOTE — Patient Instructions (Signed)
 Sent zpack

## 2023-06-24 NOTE — Progress Notes (Signed)
 Subjective:     Patient ID: Meagan [name], female    DOB: 23-Dec-1945, 78 y.o.   MRN: [id number]  Chief Complaint  Patient presents with   Cough    Productive cough with yellow mucus, started over 1 week ago   Nasal Congestion   Wheezing   Chest Congestion    HPI Discussed the use of AI scribe software for clinical note transcription with the patient, who gave verbal consent to proceed.  History of Present Illness Meagan [name] is a 78 year old female who presents with respiratory symptoms. She is accompanied by her husband.  She has been experiencing respiratory symptoms for the past week, including rhinorrhea, nasal congestion, coughing, and wheezing. The mucus is described as yellow, and the coughing is severe enough to induce emesis, which occurred last night and again in the car before arriving at the clinic. She experiences periods of calm after intense coughing episodes.  No fever or dyspnea is reported. She has a history of poor response to inhalers and does not use them due to lack of efficacy.  She is allergic to penicillin, doxycycline, and Levaquin. She typically tolerates azithromycin  well, although she sometimes requires two cycles for full effect. She has never taken prednisone and is unsure of its effects.    Health Maintenance Due  Topic Date Due   OPHTHALMOLOGY EXAM  Never done   DEXA SCAN  Never done   Medicare Annual Wellness (AWV)  03/23/2019   Diabetic kidney evaluation - Urine ACR  04/08/2023    Past Medical History:  Diagnosis Date   Anemia    hx    Anxiety    no meds   Arthritis    knees   Cancer (HCC)    endometrial cancer   Diabetes mellitus without complication (HCC)    on oral medication   GERD (gastroesophageal reflux disease)    pepcid  now/famotidine    Hypertension    SVD (spontaneous vaginal delivery)    x 1   UTI (lower urinary tract infection) 11/22/2011   started abx on 11/24/11    Past Surgical History:  Procedure  Laterality Date   CHOLECYSTECTOMY     DILATATION & CURRETTAGE/HYSTEROSCOPY WITH RESECTOCOPE  11/30/2011   Procedure: DILATATION & CURETTAGE/HYSTEROSCOPY WITH RESECTOCOPE;  Surgeon: Doren Gammons, MD;  Location: [facility];  Service: Gynecology;  Laterality: N/A;   DILATION AND CURETTAGE OF UTERUS     LYMPH NODE DISSECTION  12/14/2011   Procedure: LYMPH NODE DISSECTION;  Surgeon: Daryel Ensign A. Clerance Dais, MD;  Location: [facility];  Service: Gynecology;  Laterality: N/A;   ROBOTIC ASSISTED TOTAL HYSTERECTOMY WITH BILATERAL SALPINGO OOPHERECTOMY  12/14/2011   Procedure: ROBOTIC ASSISTED TOTAL HYSTERECTOMY WITH BILATERAL SALPINGO OOPHORECTOMY;  Surgeon: Daryel Ensign A. Clerance Dais, MD;  Location: [facility];  Service: Gynecology;  Laterality: N/A;   WISDOM TOOTH EXTRACTION       Current Outpatient Medications:    acetaminophen  (TYLENOL ) 500 MG tablet, Take 500 mg by mouth every 6 (six) hours as needed for mild pain or moderate pain., Disp: , Rfl:    aspirin  EC 81 MG tablet, Take 81 mg by mouth daily. Swallow whole., Disp: , Rfl:    azithromycin  (ZITHROMAX ) 250 MG tablet, Take 2 tablets on day 1, then 1 tablet daily on days 2 through 5, Disp: 6 tablet, Rfl: 0   cyanocobalamin  (VITAMIN B12) 1000 MCG tablet, Take 1 tablet (1,000 mcg total) by mouth daily., Disp: 90 tablet, Rfl: 0   doxazosin  (CARDURA ) 2  MG tablet, TAKE 1 TABLET AT BEDTIME, Disp: 90 tablet, Rfl: 3   famotidine  (PEPCID ) 20 MG tablet, TAKE 1 TABLET TWICE DAILY, Disp: 180 tablet, Rfl: 3   glimepiride  (AMARYL ) 4 MG tablet, TAKE 1 TABLET TWICE DAILY, Disp: 180 tablet, Rfl: 3   methenamine  (HIPREX ) 1 g tablet, Take 1 tablet (1 g total) by mouth 2 (two) times daily with a meal., Disp: 60 tablet, Rfl: 5   valsartan  (DIOVAN ) 80 MG tablet, Take 1 tablet (80 mg total) by mouth daily., Disp: 90 tablet, Rfl: 3  Allergies  Allergen Reactions   Penicillin G Anaphylaxis   Doxycycline Hyclate Nausea Only   Doxycycline Hyclate Other (See Comments)   Levaquin [Levofloxacin]     Metformin Hcl     Other Reaction(s): Updet stomach, diarrhea   Penicillin V Potassium Other (See Comments)   Shrimp [Shellfish Allergy] Nausea And Vomiting   Sinutab Sinus Max St [Phenylephrine-Acetaminophen ] Swelling   Valacyclovir Other (See Comments)    Confusion   ROS neg/noncontributory except as noted HPI/below      Objective:      BP 129/65   Pulse 75   Temp (!) 97.5 F (36.4 C) (Temporal)   Resp 18   Ht 5\' 2"  (1.575 m)   Wt 174 lb 8 oz (79.2 kg)   LMP 01/18/2001   SpO2 92%   BMI 31.92 kg/m  Wt Readings from Last 3 Encounters:  06/24/23 174 lb 8 oz (79.2 kg)  04/12/23 169 lb 9.6 oz (76.9 kg)  11/26/22 174 lb 12.8 oz (79.3 kg)    Physical Exam   Gen: [facility] NAD HEENT: NCAT, conjunctiva not injected, sclera nonicteric TM -pt declined, OP moist, no exudates  sinuses nontender.  congestes NECK:  supple, no thyromegaly, no node CARDIAC: RRR, S1S2+, no murmur. LUNGS: CTAB. No wheezes but coarse EXT:  no edema MSK: no gross abnormalities.  NEURO: A&O x3.  CN II-XII intact.  PSYCH: normal mood. Good eye contact     Assessment & Plan:  Acute bacterial bronchitis  Other orders -     Azithromycin ; Take 2 tablets on day 1, then 1 tablet daily on days 2 through 5  Dispense: 6 tablet; Refill: 0  Assessment and Plan Assessment & Plan Upper Respiratory Tract Infection   Meagan [name] has experienced rhinorrhea, congestion, coughing with yellow sputum, and wheezing for one week. She reports no fever or dyspnea, but the cough is severe enough to cause emesis. Examination shows congestion without pneumonia signs. Oxygen saturation is normal but low end. Inhalers are ineffective as well as tessalon perles. She has no asthma and is allergic to penicillin, doxycycline, and levofloxacin. Azithromycin  is well-tolerated, and informed consent for its use was obtained. Prednisone was discussed as a backup if symptoms persist, though she is concerned about mood effects. Prescribe  azithromycin . Advise an ER visit if symptoms worsen. Consider prednisone if no improvement by next week.  General Health Maintenance   Routine health maintenance was discussed, including the need for a urine exam and eye exam. She depends on reminders for scheduling these. Ensure completion of the urine exam and obtain a copy of the recent eye exam.-declines urine today    Return if symptoms worsen or fail to improve.  Ellsworth Haas, MD

## 2023-07-14 ENCOUNTER — Other Ambulatory Visit (INDEPENDENT_AMBULATORY_CARE_PROVIDER_SITE_OTHER)

## 2023-07-14 DIAGNOSIS — E1169 Type 2 diabetes mellitus with other specified complication: Secondary | ICD-10-CM | POA: Diagnosis not present

## 2023-07-14 DIAGNOSIS — E785 Hyperlipidemia, unspecified: Secondary | ICD-10-CM | POA: Diagnosis not present

## 2023-07-14 DIAGNOSIS — E1165 Type 2 diabetes mellitus with hyperglycemia: Secondary | ICD-10-CM | POA: Diagnosis not present

## 2023-07-15 LAB — HEMOGLOBIN A1C: Hgb A1c MFr Bld: 9.4 % — ABNORMAL HIGH (ref 4.6–6.5)

## 2023-07-15 LAB — CBC WITH DIFFERENTIAL/PLATELET
Basophils Absolute: 0.1 10*3/uL (ref 0.0–0.1)
Basophils Relative: 0.8 % (ref 0.0–3.0)
Eosinophils Absolute: 0.4 10*3/uL (ref 0.0–0.7)
Eosinophils Relative: 4.7 % (ref 0.0–5.0)
HCT: 38.6 % (ref 36.0–46.0)
Hemoglobin: 12.7 g/dL (ref 12.0–15.0)
Lymphocytes Relative: 30.7 % (ref 12.0–46.0)
Lymphs Abs: 2.5 10*3/uL (ref 0.7–4.0)
MCHC: 32.9 g/dL (ref 30.0–36.0)
MCV: 83.9 fl (ref 78.0–100.0)
Monocytes Absolute: 0.5 10*3/uL (ref 0.1–1.0)
Monocytes Relative: 6.5 % (ref 3.0–12.0)
Neutro Abs: 4.7 10*3/uL (ref 1.4–7.7)
Neutrophils Relative %: 57.3 % (ref 43.0–77.0)
Platelets: 273 10*3/uL (ref 150.0–400.0)
RBC: 4.6 Mil/uL (ref 3.87–5.11)
RDW: 13.9 % (ref 11.5–15.5)
WBC: 8.3 10*3/uL (ref 4.0–10.5)

## 2023-07-15 LAB — COMPREHENSIVE METABOLIC PANEL WITH GFR
ALT: 21 U/L (ref 0–35)
AST: 18 U/L (ref 0–37)
Albumin: 3.7 g/dL (ref 3.5–5.2)
Alkaline Phosphatase: 92 U/L (ref 39–117)
BUN: 34 mg/dL — ABNORMAL HIGH (ref 6–23)
CO2: 24 meq/L (ref 19–32)
Calcium: 9.3 mg/dL (ref 8.4–10.5)
Chloride: 101 meq/L (ref 96–112)
Creatinine, Ser: 1.15 mg/dL (ref 0.40–1.20)
GFR: 46.2 mL/min — ABNORMAL LOW (ref >60.00)
Glucose, Bld: 207 mg/dL — ABNORMAL HIGH (ref 70–99)
Potassium: 4.4 meq/L (ref 3.5–5.1)
Sodium: 135 meq/L (ref 135–145)
Total Bilirubin: 0.5 mg/dL (ref 0.2–1.2)
Total Protein: 7.8 g/dL (ref 6.0–8.3)

## 2023-07-15 LAB — LIPID PANEL
Cholesterol: 192 mg/dL (ref 0–200)
HDL: 44.2 mg/dL (ref >39.00)
LDL Cholesterol: 106 mg/dL — ABNORMAL HIGH (ref 0–99)
NonHDL: 147.31
Total CHOL/HDL Ratio: 4
Triglycerides: 207 mg/dL — ABNORMAL HIGH (ref 0.0–149.0)
VLDL: 41.4 mg/dL — ABNORMAL HIGH (ref 0.0–40.0)

## 2023-07-19 ENCOUNTER — Ambulatory Visit: Payer: Self-pay | Admitting: Family Medicine

## 2023-07-21 ENCOUNTER — Encounter: Payer: Self-pay | Admitting: Family Medicine

## 2023-07-21 ENCOUNTER — Ambulatory Visit: Admitting: Family Medicine

## 2023-07-21 VITALS — BP 148/72 | HR 96 | Temp 97.5°F | Ht 62.0 in | Wt 175.4 lb

## 2023-07-21 DIAGNOSIS — I10 Essential (primary) hypertension: Secondary | ICD-10-CM | POA: Diagnosis not present

## 2023-07-21 DIAGNOSIS — E785 Hyperlipidemia, unspecified: Secondary | ICD-10-CM | POA: Diagnosis not present

## 2023-07-21 DIAGNOSIS — N1831 Chronic kidney disease, stage 3a: Secondary | ICD-10-CM | POA: Diagnosis not present

## 2023-07-21 DIAGNOSIS — E1165 Type 2 diabetes mellitus with hyperglycemia: Secondary | ICD-10-CM | POA: Diagnosis not present

## 2023-07-21 DIAGNOSIS — Z7984 Long term (current) use of oral hypoglycemic drugs: Secondary | ICD-10-CM | POA: Diagnosis not present

## 2023-07-21 DIAGNOSIS — E1169 Type 2 diabetes mellitus with other specified complication: Secondary | ICD-10-CM | POA: Diagnosis not present

## 2023-07-21 NOTE — Progress Notes (Signed)
 Phone [phone] In person visit   Subjective:   Meagan [name] is a 78 y.o. year old very pleasant female patient who presents for/with See problem oriented charting Chief Complaint  Patient presents with   Medical Management of Chronic Issues    Patient came in today for a 14 week follow-up for Diabetes and Blood pressure, BG was 258 at lunch, patient is not taking BP at home     Past Medical History-  Patient Active Problem List   Diagnosis Date Noted   E coli bacteremia 04/17/2021    Priority: High   History of TIA (transient ischemic attack) 04/15/2021    Priority: High   Diabetes mellitus, type 2 (HCC) 02/12/2015    Priority: High   Normocytic anemia 04/16/2021    Priority: Medium    Acid reflux 02/12/2015    Priority: Medium    Hyperlipidemia associated with type 2 diabetes mellitus (HCC) 02/12/2015    Priority: Medium    Primary hypertension 12/08/2011    Priority: Medium    GERD (gastroesophageal reflux disease) 03/03/2021    Priority: Low   Dermatitis, eczematoid 02/12/2015    Priority: Low   History of endometrial cancer 12/08/2011    Priority: Low    Medications- reviewed and updated Current Outpatient Medications  Medication Sig Dispense Refill   acetaminophen  (TYLENOL ) 500 MG tablet Take 500 mg by mouth every 6 (six) hours as needed for mild pain or moderate pain.     aspirin  EC 81 MG tablet Take 81 mg by mouth daily. Swallow whole.     doxazosin  (CARDURA ) 2 MG tablet TAKE 1 TABLET AT BEDTIME 90 tablet 3   famotidine  (PEPCID ) 20 MG tablet TAKE 1 TABLET TWICE DAILY 180 tablet 3   glimepiride  (AMARYL ) 4 MG tablet TAKE 1 TABLET TWICE DAILY 180 tablet 3   methenamine  (HIPREX ) 1 g tablet Take 1 tablet (1 g total) by mouth 2 (two) times daily with a meal. 60 tablet 5   valsartan  (DIOVAN ) 80 MG tablet Take 1 tablet (80 mg total) by mouth daily. 90 tablet 3   cyanocobalamin  (VITAMIN B12) 1000 MCG tablet Take 1 tablet (1,000 mcg total) by mouth daily.  (Patient not taking: Reported on 07/21/2023) 90 tablet 0   No current facility-administered medications for this visit.     Objective:  BP (!) 148/72   Pulse 96   Temp (!) 97.5 F (36.4 C) (Oral)   Ht 5' 2 (1.575 m)   Wt 175 lb 6.4 oz (79.6 kg)   LMP 01/18/2001   SpO2 94%   BMI 32.08 kg/m  Gen: NAD, resting comfortably CV: RRR no murmurs rubs or gallops Lungs: CTAB no crackles, wheeze, rhonchi Ext: trace edema on left foot only- otherwise no edema Skin: warm, dry     Assessment and Plan   #Poorly controlled diabetes S: Medication: Glimepiride  4 mg twice daily, has declined all other medications despite very poor control of diabetes -Declines metformin worried about GI side effects -Declines Actos with family history of CHF -Declines insulin  but may be possibly interested depending on husband's course with new start-relion options may make this more cost effective -Declines diabetes education - Cost is a major concern so declines Ozempic and with prior UTI issues do not think Jardiance is a good choice plus would be costly - Did agree to [facility] consult then later declined Lab Results  Component Value Date   HGBA1C 9.4 (H) 07/14/2023   HGBA1C 8.1 (H) 04/05/2023   HGBA1C  10.8 (A) 11/26/2022  CBGs- mostly 200's but ups and downs with food Exercise and diet- hard to stay on diet that helped her get it down  A/P: Unfortunately A1c with worsening control.  Patient continues to decline any medication changes-wants to buckle down on healthy eating/regular exercise and see if she can get it back down to 8 at least -Still pending microalbuminuria evaluation - has urine today- we will leave in fridge for Monday evaluation with lab not available today  #BUN to creatinine ratio over 20/1- discussed dehydration. Also discussed mild decrease to kidney function though stable in last year -GFR in 40's- really need the microalbumin test and suspect may need higher valsartan - appears prior tests  have bene removed- do not see prior test from march 2024 I had mentioned to myself in a note from last visit   #hypertension S: medication:  doxazosin  2 mg, valsartan  80 mg -prior on triamterene  hctz but with hospitalization thought dehydrating potentially and stopped in 2023 Home readings #s: no recent checks BP Readings from Last 3 Encounters:  07/21/23 (!) 148/72  06/24/23 129/65  04/12/23 (!) 140/60   A/P: blood pressure slightly high today but was 129/65 on June 6th- she prefers to monitor at home and update me next week with home readings  #hyperlipidemia S: Medication:none, patient not interested Lab Results  Component Value Date   CHOL 192 07/14/2023   HDL 44.20 07/14/2023   LDLCALC 106 (H) 07/14/2023   LDLDIRECT 90.0 04/08/2022   TRIG 207.0 (H) 07/14/2023   CHOLHDL 4 07/14/2023  A/P: lipids remain high and counseled on importance of statin particularly with diabetes but she declines- recommended even just once a week statin but declines  Recommended follow up: Return in about 14 weeks (around 10/27/2023) for followup or sooner if needed.Schedule b4 you leave.  Lab/Order associations:   ICD-10-CM   1. Type 2 diabetes mellitus with hyperglycemia, without long-term current use of insulin  (HCC)  E11.65 Microalbumin / creatinine urine ratio    2. Primary hypertension  I10     3. Chronic kidney disease, stage 3a (HCC)  N18.31     4. Hyperlipidemia associated with type 2 diabetes mellitus (HCC)  E11.69    E78.5      No orders of the defined types were placed in this encounter.  Return precautions advised.  Garnette Lukes, MD

## 2023-07-21 NOTE — Patient Instructions (Addendum)
 Please go to [facility] on Monday to drop off urine - located [address] across [address] from [place] - in the basement - Hours: 7:30-5:30 PM M-F. You do NOT need an appointment.    A1c has jumped up to 9.4 from 8.1- you know what to do to get this back down dietary wise but declined medication changes- lets check back in 14 weeks from now  blood pressure slightly high today but was 129/65 on June 6th- she prefers to monitor at home and update me next week with home readings- mychart or phone call  Recommended follow up: Return in about 14 weeks (around 10/27/2023) for followup or sooner if needed.Schedule b4 you leave.

## 2023-07-26 ENCOUNTER — Other Ambulatory Visit: Payer: Self-pay | Admitting: Family Medicine

## 2023-07-26 ENCOUNTER — Ambulatory Visit: Payer: Self-pay | Admitting: Family Medicine

## 2023-07-26 ENCOUNTER — Other Ambulatory Visit (INDEPENDENT_AMBULATORY_CARE_PROVIDER_SITE_OTHER)

## 2023-07-26 DIAGNOSIS — E1165 Type 2 diabetes mellitus with hyperglycemia: Secondary | ICD-10-CM | POA: Diagnosis not present

## 2023-07-26 LAB — MICROALBUMIN / CREATININE URINE RATIO
Creatinine,U: 35 mg/dL
Microalb Creat Ratio: 1481.7 mg/g — ABNORMAL HIGH (ref 0.0–30.0)
Microalb, Ur: 51.9 mg/dL — ABNORMAL HIGH (ref 0.0–1.9)

## 2023-07-26 MED ORDER — METHENAMINE HIPPURATE 1 G PO TABS: 1.0000 g | ORAL_TABLET | Freq: Two times a day (BID) | ORAL | 5 refills | Status: DC

## 2023-07-26 NOTE — Telephone Encounter (Signed)
 Copied from CRM [id number]. Topic: Clinical - Medication Refill >> Jul 26, 2023 11:48 AM [place] M wrote: Medication: methenamine  (HIPREX ) 1 g tablet  Has the patient contacted their pharmacy? Yes (Agent: If no, request that the patient contact the pharmacy for the refill. If patient does not wish to contact the pharmacy document the reason why and proceed with request.) (Agent: If yes, when and what did the pharmacy advise?)  This is the patient's preferred pharmacy:  [facility] [address], [place] - [address] [place] [address] [place] [address] Phone: [phone] Fax: [phone]     Is this the correct pharmacy for this prescription? Yes If no, delete pharmacy and type the correct one.   Has the prescription been filled recently? Yes  Is the patient out of the medication? Yes  Has the patient been seen for an appointment in the last year OR does the patient have an upcoming appointment? Yes  Can we respond through MyChart? Yes  Agent: Please be advised that Rx refills may take up to 3 business days. We ask that you follow-up with your pharmacy.

## 2023-07-27 ENCOUNTER — Other Ambulatory Visit: Payer: Self-pay

## 2023-07-27 DIAGNOSIS — R809 Proteinuria, unspecified: Secondary | ICD-10-CM

## 2023-07-27 DIAGNOSIS — E1165 Type 2 diabetes mellitus with hyperglycemia: Secondary | ICD-10-CM

## 2023-07-27 MED ORDER — VALSARTAN 160 MG PO TABS: 80.0000 mg | ORAL_TABLET | Freq: Every day | ORAL | 3 refills | Status: DC

## 2023-07-29 NOTE — Telephone Encounter (Signed)
 Please see pt msg and advise

## 2023-08-15 ENCOUNTER — Telehealth: Payer: Self-pay | Admitting: Family Medicine

## 2023-08-15 ENCOUNTER — Ambulatory Visit: Admitting: Family Medicine

## 2023-08-15 ENCOUNTER — Ambulatory Visit: Payer: Self-pay

## 2023-08-15 NOTE — Telephone Encounter (Signed)
 Appt today

## 2023-08-15 NOTE — Telephone Encounter (Signed)
 Advised pt's spouse patient needs to go to ED per provider. Spouse agreed. Advised to go to [facility]

## 2023-08-15 NOTE — Telephone Encounter (Signed)
 FYI Only or Action Required?: FYI only for provider.  Patient was last seen in primary care on 07/21/2023 by [name], MD.  Called Nurse Triage reporting Dehydration and Fatigue.  Symptoms began several days ago.  Interventions attempted: Rest, hydration, or home remedies.  Symptoms are: unchanged.  Triage Disposition: See HCP Within 4 Hours (Or PCP Triage)  Patient/caregiver understands and will follow disposition?: Yes      Copied from CRM #[id number]. Topic: Clinical - Red Word Triage >> Aug 15, 2023  8:14 AM [place] MATSU wrote: Red Word that prompted transfer to Nurse Triage: unstable per patients husband. Patient and husband think she is dehydrated. Very fatigue Reason for Disposition  [1] MODERATE weakness (e.g., interferes with work, school, normal activities) AND [2] new-onset or getting worse  [1] MODERATE weakness (e.g., interferes with work, school, normal activities) AND [2] cause unknown  (Exceptions: Weakness from acute minor illness or poor fluid intake; weakness is chronic and not worse.)  Answer Assessment - Initial Assessment Questions 1. DESCRIPTION: Describe how you are feeling.     Wobbly 2. SEVERITY: How bad is it?  Can you stand and walk?     When she walks it's very shaky... she has to hold on to something a lot 3. ONSET: When did these symptoms begin? (e.g., hours, days, weeks, months)     A few days  4. CAUSE: What do you think is causing the weakness or fatigue? (e.g., not drinking enough fluids, medical problem, trouble sleeping)     Husband thinks dehydration - husband reports has had similar sx and PCP reported r/t dehydration 5. NEW MEDICINES:  Have you started on any new medicines recently? (e.g., opioid pain medicines, benzodiazepines, muscle relaxants, antidepressants, antihistamines, neuroleptics, beta blockers)     denies 6. OTHER SYMPTOMS: Do you have any other symptoms? (e.g., chest pain, fever, cough, SOB, vomiting,  diarrhea, bleeding, other areas of pain)     Denies Endorses spinal stenosis 7. PREGNANCY: Is there any chance you are pregnant? When was your last menstrual period?     N/a  Answer Assessment - Initial Assessment Questions 1. MECHANISM: How did the fall happen?     Her legs gave out - she was asleep and got up and then legs gave out 2. DOMESTIC VIOLENCE AND ELDER ABUSE SCREENING: Did you fall because someone pushed you or tried to hurt you? If Yes, ask: Are you safe now?     N/a 3. ONSET: When did the fall happen? (e.g., minutes, hours, or days ago)     Friday 4. LOCATION: What part of the body hit the ground? (e.g., back, buttocks, head, hips, knees, hands, head, stomach)     bottom 5. INJURY: Did you hurt (injure) yourself when you fell? If Yes, ask: What did you injure? Tell me more about this? (e.g., body area; type of injury; pain severity)     denies 6. PAIN: Is there any pain? If Yes, ask: How bad is the pain? (e.g., Scale 0-10; or none, mild,      Denies Endorses legs sore 7. SIZE: For cuts, bruises, or swelling, ask: How large is it? (e.g., inches or centimeters)      denies 8. PREGNANCY: Is there any chance you are pregnant? When was your last menstrual period?     N/a 9. OTHER SYMPTOMS: Do you have any other symptoms? (e.g., dizziness, fever, weakness; new-onset or worsening).      denies 10. CAUSE: What do you think caused the  fall (or falling)? (e.g., dizzy spell, tripped)       Legs gave out  Protocols used: Weakness (Generalized) and Fatigue-A-AH, Falls and Falling-A-AH

## 2023-08-15 NOTE — Telephone Encounter (Signed)
 Noted. Pt advised to go to ED

## 2023-08-16 ENCOUNTER — Emergency Department (HOSPITAL_COMMUNITY)

## 2023-08-16 ENCOUNTER — Emergency Department (HOSPITAL_COMMUNITY)
Admission: EM | Admit: 2023-08-16 | Discharge: 2023-08-16 | Disposition: A | Discharge status: Home / Self Care | Attending: Emergency Medicine | Admitting: Emergency Medicine

## 2023-08-16 ENCOUNTER — Other Ambulatory Visit: Payer: Self-pay

## 2023-08-16 ENCOUNTER — Encounter (HOSPITAL_COMMUNITY): Payer: Self-pay | Admitting: Emergency Medicine

## 2023-08-16 DIAGNOSIS — Z7984 Long term (current) use of oral hypoglycemic drugs: Secondary | ICD-10-CM | POA: Insufficient documentation

## 2023-08-16 DIAGNOSIS — Z7982 Long term (current) use of aspirin: Secondary | ICD-10-CM | POA: Diagnosis not present

## 2023-08-16 DIAGNOSIS — R2681 Unsteadiness on feet: Secondary | ICD-10-CM | POA: Insufficient documentation

## 2023-08-16 DIAGNOSIS — E119 Type 2 diabetes mellitus without complications: Secondary | ICD-10-CM | POA: Insufficient documentation

## 2023-08-16 DIAGNOSIS — W108XXA Fall (on) (from) other stairs and steps, initial encounter: Secondary | ICD-10-CM | POA: Diagnosis not present

## 2023-08-16 DIAGNOSIS — R269 Unspecified abnormalities of gait and mobility: Secondary | ICD-10-CM | POA: Diagnosis not present

## 2023-08-16 LAB — URINALYSIS, W/ REFLEX TO CULTURE (INFECTION SUSPECTED)
Bilirubin Urine: NEGATIVE
Glucose, UA: NEGATIVE mg/dL
Ketones, ur: NEGATIVE mg/dL
Nitrite: POSITIVE — AB
Protein, ur: 100 mg/dL — AB
Specific Gravity, Urine: 1.006 (ref 1.005–1.030)
WBC, UA: >50 WBC/hpf (ref 0–5)
pH: 5 (ref 5.0–8.0)

## 2023-08-16 LAB — BASIC METABOLIC PANEL WITH GFR
Anion gap: 12 (ref 5–15)
BUN: 25 mg/dL — ABNORMAL HIGH (ref 8–23)
CO2: 20 mmol/L — ABNORMAL LOW (ref 22–32)
Calcium: 9.2 mg/dL (ref 8.9–10.3)
Chloride: 98 mmol/L (ref 98–111)
Creatinine, Ser: 0.9 mg/dL (ref 0.44–1.00)
GFR, Estimated: >60 mL/min (ref >60)
Glucose, Bld: 168 mg/dL — ABNORMAL HIGH (ref 70–99)
Potassium: 4.2 mmol/L (ref 3.5–5.1)
Sodium: 130 mmol/L — ABNORMAL LOW (ref 135–145)

## 2023-08-16 LAB — MAGNESIUM: Magnesium: 1.9 mg/dL (ref 1.7–2.4)

## 2023-08-16 LAB — CBC WITH DIFFERENTIAL/PLATELET
Abs Immature Granulocytes: 0.07 K/uL (ref 0.00–0.07)
Basophils Absolute: 0.1 K/uL (ref 0.0–0.1)
Basophils Relative: 1 %
Eosinophils Absolute: 0.3 K/uL (ref 0.0–0.5)
Eosinophils Relative: 3 %
HCT: 38.4 % (ref 36.0–46.0)
Hemoglobin: 12.4 g/dL (ref 12.0–15.0)
Immature Granulocytes: 1 %
Lymphocytes Relative: 19 %
Lymphs Abs: 2 K/uL (ref 0.7–4.0)
MCH: 27.4 pg (ref 26.0–34.0)
MCHC: 32.3 g/dL (ref 30.0–36.0)
MCV: 84.8 fL (ref 80.0–100.0)
Monocytes Absolute: 0.6 K/uL (ref 0.1–1.0)
Monocytes Relative: 6 %
Neutro Abs: 7.5 K/uL (ref 1.7–7.7)
Neutrophils Relative %: 70 %
Platelets: 255 K/uL (ref 150–400)
RBC: 4.53 MIL/uL (ref 3.87–5.11)
RDW: 12.7 % (ref 11.5–15.5)
WBC: 10.5 K/uL (ref 4.0–10.5)
nRBC: 0 % (ref 0.0–0.2)

## 2023-08-16 MED ORDER — SODIUM CHLORIDE 0.9 % IV BOLUS
1000.0000 mL | Freq: Once | INTRAVENOUS | Status: AC
  Administered 2023-08-16: 1000 mL via INTRAVENOUS

## 2023-08-16 MED ORDER — LORAZEPAM 1 MG PO TABS: 1.0000 mg | ORAL_TABLET | Freq: Once | ORAL | Status: DC

## 2023-08-16 NOTE — ED Triage Notes (Signed)
 Patient comes in for a fall on Friday. States history of spinal stenosis. Patient states her legs are not able to hold her up since fall and has almost fallen multiple time since. Patient doesn't know if it was the spinal stenosis for increased weakness or the fall.  Patient also has a history dehydration wants this checked as well.   Patient just increased valsartan  and is worried this may be an issue with the med causing legs to give out.  She doesn't use a cane and walker states this would not help current and new issue.

## 2023-08-16 NOTE — Discharge Instructions (Signed)
 You were seen in the emergency department for instability when walking You chose to leave AGAINST MEDICAL ADVICE prior to getting the MRI done Please come back to the emergency department if you change your mind about this as you are at significant risk of falling and breaking a hip at home

## 2023-08-16 NOTE — ED Provider Notes (Signed)
 [place] EMERGENCY DEPARTMENT AT [facility] Provider Note   CSN: [id number] Arrival date & time: 08/16/23  9162     Patient presents with: Meagan   MOOREA [name] is a 78 y.o. female.  With a history of cervical spinal stenosis, type 2 diabetes who presents to the ED after a fall.  Patient reports 1 week of significant unsteadiness on her feet.  She is having great difficulty walking due to weakness in both legs and typically leans to the left.  She did have a fall 4 days ago at home as her legs bent backwards on the steps at home.  No head trauma or loss of consciousness.  Did have some pain in her right knee yesterday but no pain today.  Denies chest pain back pain abdominal pain shortness of breath and pain in other extremities.  MRI and CT from 2023 showed severe cervical spinal stenosis however patient did not wish for neurosurgical intervention for this issue at the time.  No focal weakness or new paresthesias.  No urinary or bowel incontinence.    Fall       Prior to Admission medications   Medication Sig Start Date End Date Taking? Authorizing Provider  acetaminophen  (TYLENOL ) 500 MG tablet Take 500 mg by mouth every 6 (six) hours as needed for mild pain or moderate pain.    [provider]  aspirin  EC 81 MG tablet Take 81 mg by mouth daily. Swallow whole.    [provider]  doxazosin  (CARDURA ) 2 MG tablet TAKE 1 TABLET AT BEDTIME 06/03/23   [name], MD  famotidine  (PEPCID ) 20 MG tablet TAKE 1 TABLET TWICE DAILY 02/11/23   [name], MD  glimepiride  (AMARYL ) 4 MG tablet TAKE 1 TABLET TWICE DAILY 04/25/23   [name], MD  methenamine  (HIPREX ) 1 g tablet Take 1 tablet (1 g total) by mouth 2 (two) times daily with a meal. 07/26/23   [name], MD  valsartan  (DIOVAN ) 160 MG tablet Take 0.5 tablets (80 mg total) by mouth daily. 07/27/23   [name], MD    Allergies: Penicillin g, Doxycycline hyclate, Doxycycline  hyclate, Levaquin [levofloxacin], Metformin hcl, Penicillin v potassium, Shrimp [shellfish allergy], Sinutab sinus max st [phenylephrine-acetaminophen ], and Valacyclovir    Review of Systems  Updated Vital Signs BP (!) 171/75 (BP Location: Right Arm)   Pulse 88   Temp 98.4 F (36.9 C) (Oral)   Resp 18   Ht 5' 2 (1.575 m)   Wt 76.7 kg   LMP 01/18/2001   SpO2 97%   BMI 30.91 kg/m   Physical Exam Vitals and nursing note reviewed.  HENT:     Head: Normocephalic and atraumatic.  Eyes:     Pupils: Pupils are equal, round, and reactive to light.  Cardiovascular:     Rate and Rhythm: Normal rate and regular rhythm.  Pulmonary:     Effort: Pulmonary effort is normal.     Breath sounds: Normal breath sounds.  Abdominal:     Palpations: Abdomen is soft.     Tenderness: There is no abdominal tenderness.  Musculoskeletal:     Cervical back: Neck supple. No tenderness.     Comments: No midline tenderness step-off or deformity of back 5 out of 5 motor strength bilateral upper and lower extremities Sensation tact light touch throughout 2+ distal pulses in DP and radial bilaterally  Skin:    General: Skin is warm and dry.  Neurological:  General: No focal deficit present.     Mental Status: She is alert and oriented to person, place, and time.     Sensory: No sensory deficit.     Motor: No weakness.     Comments: Positive Romberg Very unsteady gait favoring to the left  Psychiatric:        Mood and Affect: Mood normal.     (all labs ordered are listed, but only abnormal results are displayed) Labs Reviewed  BASIC METABOLIC PANEL WITH GFR - Abnormal; Notable for the following components:      Result Value   Sodium 130 (*)    CO2 20 (*)    Glucose, Bld 168 (*)    BUN 25 (*)    All other components within normal limits  URINALYSIS, W/ REFLEX TO CULTURE (INFECTION SUSPECTED) - Abnormal; Notable for the following components:   APPearance HAZY (*)    Hgb urine dipstick  SMALL (*)    Protein, ur 100 (*)    Nitrite POSITIVE (*)    Leukocytes,Ua LARGE (*)    Bacteria, UA MANY (*)    All other components within normal limits  URINE CULTURE  CBC WITH DIFFERENTIAL/PLATELET  MAGNESIUM     EKG: None  Radiology: No results found.   Procedures   Medications Ordered in the ED  LORazepam  (ATIVAN ) tablet 1 mg (has no administration in time range)  sodium chloride  0.9 % bolus 1,000 mL (0 mLs Intravenous Stopped 08/16/23 1117)    Clinical Course as of 08/16/23 1246  Tue Aug 16, 2023  1105 Unfortunately this patient now wants to leave AGAINST MEDICAL ADVICE prior to her MRI.  I spoke with her and her husband about the dangers of her leaving given her severe instability and fall risk at home.  She acknowledged these risks and still wants to leave AGAINST MEDICAL ADVICE prior to the MRI. [MP]    Clinical Course User Index [MP] Meagan [name] LABOR, DO                                 Medical Decision Making 78 year old female with history as above presenting with concern for creasing weakness in both legs and difficulty with walking.  Did fall on her steps at home 4 days ago due to weakness in both legs giving out.  5 out of 5 motor strength without deficits throughout bilateral upper and lower extremities however when I stand patient and ask her to walk a few steps in the room she is very unsteady on her feet.  This is worsened over the last week.  Concern for potential posterior stroke.  Will obtain MRI brain without contrast along with MRI cervical spine to see if her severe stenosis from 2 years ago has gotten any worse may be contributing to her symptoms.  She voiced concern for dehydration as well.  Will obtain laboratory workup and provide IV fluids for rehydration.  Amount and/or Complexity of Data Reviewed Labs: ordered. Radiology: ordered.  Risk Prescription drug management.        Final diagnoses:  Gait instability    ED Discharge Orders      None          Meagan [name] LABOR, DO 08/16/23 1246

## 2023-08-17 ENCOUNTER — Ambulatory Visit: Payer: Self-pay

## 2023-08-17 ENCOUNTER — Inpatient Hospital Stay: Admitting: Family

## 2023-08-17 DIAGNOSIS — G959 Disease of spinal cord, unspecified: Secondary | ICD-10-CM | POA: Diagnosis not present

## 2023-08-17 DIAGNOSIS — M4802 Spinal stenosis, cervical region: Secondary | ICD-10-CM | POA: Diagnosis not present

## 2023-08-17 NOTE — Telephone Encounter (Signed)
 Patient scheduled today, 08/17/23 with Corean Comment.

## 2023-08-17 NOTE — Telephone Encounter (Signed)
 Please advise  FYI Only or Action Required?: Action required by provider: clinical question for provider, update on patient condition, and lab or test result follow-up needed.  Patient was last seen in primary care on 07/21/2023 by Meagan Garnette KIDD, MD.  Called Nurse Triage reporting Urinary Tract Infection.  Symptoms are: gradually worsening.  Triage Disposition: Call PCP Within 24 Hours  Patient/caregiver understands and will follow disposition?: Yes Reason for Disposition  [1] POSITIVE urine test (i.e., NI + or LE + or WBC > 10) AND [2] NO standing order to call in prescription for antibiotic  Answer Assessment - Initial Assessment Questions Patient requesting antibiotic for UTI - see result in epic. Husband cancelled hospital f/u appt as he is unable to get her into the car [name]/t increased gait instability - says current medications are not helping.   1. TEST: What kind of urine test was performed? (e.g., urinalysis, urine dipstick)      UA at ED yesterday 08/16/23  2. URINALYSIS RESULT: Was it positive or negative?  If positive, document what was positive (e.g., LE, nitrites, WBC, RBC, bacteria, epithelial cells)     Positive (see in Epic)  3. FEVER: Do you have a fever? If Yes, ask: What is your temperature, how was it measured, and when did it start?     Denies  4. FLANK PAIN: Do you have any pain in your side?     Denies  5. OTHER SYMPTOMS: Do you have any other symptoms? (e.g., blood in urine, vomiting)     Denies any symptoms  Protocols used: Urinalysis Results Follow-up Call-A-AH Copied from CRM #[id number]. Topic: Clinical - Red Word Triage >> Aug 17, 2023  3:18 PM Meagan [name] wrote: Red Word that prompted transfer to Nurse Triage: Patient has a UTI but has spinal stenosous and is unable to get to the car. Visit today this afternoon for hospital follow-up.

## 2023-08-17 NOTE — Telephone Encounter (Signed)
 Noted; please contact patient to schedule ED follow up

## 2023-08-17 NOTE — Telephone Encounter (Signed)
 Patient cancelled appointment.

## 2023-08-18 ENCOUNTER — Encounter: Payer: Self-pay | Admitting: Student

## 2023-08-18 ENCOUNTER — Other Ambulatory Visit: Payer: Self-pay

## 2023-08-18 ENCOUNTER — Other Ambulatory Visit: Payer: Self-pay | Admitting: Student

## 2023-08-18 DIAGNOSIS — G959 Disease of spinal cord, unspecified: Secondary | ICD-10-CM

## 2023-08-18 MED ORDER — NITROFURANTOIN MONOHYD MACRO 100 MG PO CAPS: 100.0000 mg | ORAL_CAPSULE | Freq: Two times a day (BID) | ORAL | 0 refills | Status: DC

## 2023-08-18 NOTE — Telephone Encounter (Signed)
 Can you help with this? The results from the culture done at the hospital are in.

## 2023-08-18 NOTE — Telephone Encounter (Signed)
 Called to make pt aware that antibiotic sent to pharmacy, unable to hear due to a lot of noise in background. If pt returns call please make aware that medication has been sent to [facility].

## 2023-08-18 NOTE — Addendum Note (Signed)
 Addended by: KALLIE RAEANNE DEL on: 08/18/2023 01:51 PM   Modules accepted: Orders

## 2023-08-19 ENCOUNTER — Telehealth: Payer: Self-pay | Admitting: *Deleted

## 2023-08-19 ENCOUNTER — Ambulatory Visit
Admission: RE | Admit: 2023-08-19 | Discharge: 2023-08-19 | Disposition: A | Discharge status: Home / Self Care | Source: Ambulatory Visit | Attending: Student | Admitting: Student

## 2023-08-19 DIAGNOSIS — G959 Disease of spinal cord, unspecified: Secondary | ICD-10-CM

## 2023-08-19 LAB — URINE CULTURE: Culture: >=100000 — AB

## 2023-08-19 NOTE — Telephone Encounter (Signed)
 Copied from CRM [id number]. Topic: Clinical - Medication Question >> Aug 18, 2023  1:58 PM Mesmerise C wrote: Reason for CRM: Patient inquiring if doctor wants her to continue taking the medication he gave her or take the antibiotic Macrobid  that was sent in and try that   Please advise  [facility]

## 2023-08-19 NOTE — Telephone Encounter (Signed)
 Patient was informed to Hold the methenamine   while on the macrobid  and then restart it when done Verbalized understanding

## 2023-08-19 NOTE — Telephone Encounter (Signed)
 Meagan [name]

## 2023-08-20 ENCOUNTER — Telehealth (HOSPITAL_BASED_OUTPATIENT_CLINIC_OR_DEPARTMENT_OTHER): Payer: Self-pay | Admitting: *Deleted

## 2023-08-20 NOTE — Telephone Encounter (Signed)
 Post ED Visit - Positive Culture Follow-up  Culture report reviewed by antimicrobial stewardship pharmacist: [name] []  [name], Pharm.D. []  [name], Pharm.D., BCPS AQ-ID []  [name], Pharm.D., BCPS []  [name], Pharm.D., BCPS []  [name], [address].D., BCPS, AAHIVP []  [name], Pharm.D., BCPS, AAHIVP []  [name], PharmD, BCPS []  [name], PharmD, BCPS []  [name], PharmD, BCPS []  [name], PharmD []  [name], PharmD, BCPS []  [name], PharmD  [name] []  [name], PharmD []  [name], PharmD []  [name], PharmD []  [name], Rph []  [name], PharmD []  [name], PharmD []  [name], PharmD []  [name], PharmD []  [name], PharmD []  [name], PharmD []  [name], PharmD []  [name], PharmD []  [name], PharmD   Positive urine culture Per pharmacist above- She spoke to the patient on the phone. She stated that her PCP prescribed Macrobid  to her UTI and she is taking it now.   Meagan [name] 08/20/2023, 9:31 AM

## 2023-08-20 NOTE — Progress Notes (Signed)
 ED Antimicrobial Stewardship Positive Culture Follow Up   Meagan [name] is an 78 y.o. female who presented to [facility] on 08/16/2023 with a chief complaint of legs weakness. Chief Complaint  Patient presents with   Fall    Recent Results (from the past 720 hours)  Urine Culture     Status: Abnormal   Collection Time: 08/16/23 10:57 AM   Specimen: Urine, Random  Result Value Ref Range Status   Specimen Description   Final    URINE, RANDOM Performed at [facility], [address] [place], [place] [address]    Special Requests   Final    NONE Reflexed from [id number] Performed at [facility], [address] [place], [place] [address]    Culture >=100,000 COLONIES/mL ESCHERICHIA COLI (A)  Final   Report Status 08/19/2023 FINAL  Final   Organism ID, Bacteria ESCHERICHIA COLI (A)  Final      Susceptibility   Escherichia coli - MIC*    AMPICILLIN >=32 RESISTANT Resistant     CEFAZOLIN <=4 SENSITIVE Sensitive     CEFEPIME <=0.12 SENSITIVE Sensitive     CEFTRIAXONE  <=0.25 SENSITIVE Sensitive     CIPROFLOXACIN  <=0.25 SENSITIVE Sensitive     GENTAMICIN >=16 RESISTANT Resistant     IMIPENEM <=0.25 SENSITIVE Sensitive     NITROFURANTOIN  <=16 SENSITIVE Sensitive     TRIMETH /SULFA  >=320 RESISTANT Resistant     AMPICILLIN/SULBACTAM >=32 RESISTANT Resistant     PIP/TAZO <=4 SENSITIVE Sensitive ug/mL    * >=100,000 COLONIES/mL ESCHERICHIA COLI    I spoke to the patient on the phone.  She stated that her PCP prescribed Macrobid  to her UTI and she is taking it now.    Meagan [name] 08/20/2023, 9:12 AM Clinical Pharmacist [phone]

## 2023-08-22 ENCOUNTER — Ambulatory Visit: Admission: RE | Admit: 2023-08-22 | Source: Ambulatory Visit

## 2023-08-22 ENCOUNTER — Other Ambulatory Visit: Payer: Self-pay

## 2023-08-22 MED ORDER — DOXAZOSIN MESYLATE 2 MG PO TABS: 2.0000 mg | ORAL_TABLET | Freq: Every day | ORAL | 0 refills | Status: AC

## 2023-08-31 ENCOUNTER — Ambulatory Visit: Payer: Self-pay

## 2023-08-31 ENCOUNTER — Other Ambulatory Visit: Payer: Self-pay | Admitting: Family Medicine

## 2023-08-31 DIAGNOSIS — R300 Dysuria: Secondary | ICD-10-CM

## 2023-08-31 DIAGNOSIS — N390 Urinary tract infection, site not specified: Secondary | ICD-10-CM

## 2023-08-31 DIAGNOSIS — R350 Frequency of micturition: Secondary | ICD-10-CM

## 2023-08-31 NOTE — Telephone Encounter (Signed)
 FYI Only or Action Required?: Action required by provider: clinical question for provider.  Patient was last seen in primary care on 07/21/2023 by [name], MD.  Called Nurse Triage reporting Abdominal Pain.  Symptoms began today.  Interventions attempted: Nothing.  Symptoms are: gradually worsening.  Triage Disposition: Home Care  Patient/caregiver understands and will follow disposition?: No, wishes to speak with PCP    Please call Husband [phone]     Copied from CRM #[id number]. Topic: Clinical - Red Word Triage >> Aug 31, 2023 12:17 PM Meagan [name] wrote: Red Word that prompted transfer to Nurse Triage: Patient's husband [name] is calling in because his wife is in pain he stated he think she is showing UTI symptoms. Reason for Disposition  [1] MILD-MODERATE pain AND [2] constant and [3] present < 2 hours    Routing message to PCP - to ask if husband can bring urine sample  Answer Assessment - Initial Assessment Questions 1. LOCATION: Where does it hurt?      Left side 2. RADIATION: Does the pain shoot anywhere else? (e.g., chest, back)     no 3. ONSET: When did the pain begin? (e.g., minutes, hours or days ago)      today 4. SUDDEN: Gradual or sudden onset?     sudden 5. PATTERN Does the pain come and go, or is it constant?     Comes and goes 6. SEVERITY: How bad is the pain?  (e.g., Scale 1-10; mild, moderate, or severe)     moderate 7. RECURRENT SYMPTOM: Have you ever had this type of stomach pain before? If Yes, ask: When was the last time? and What happened that time?      na 8. CAUSE: What do you think is causing the stomach pain? (e.g., gallstones, recent abdominal surgery)     UTI 9. RELIEVING/AGGRAVATING FACTORS: What makes it better or worse? (e.g., antacids, bending or twisting motion, bowel movement)     na 10. OTHER SYMPTOMS: Do you have any other symptoms? (e.g., back pain, diarrhea, fever, urination pain, vomiting)        no 11. PREGNANCY: Is there any chance you are pregnant? When was your last menstrual period?       Na  Has spinal stenosis. Husband wants to bring sample in 09/01/2023.  Pt thinks she may have a UTI  Protocols used: Abdominal Pain - Female-A-AH

## 2023-08-31 NOTE — Telephone Encounter (Signed)
 Called and spoke with patients husband after speaking with Dr. Katrinka and they declined an office visit that Dr. Katrinka advised. Stated she will contact doctor hunter and let him know.

## 2023-09-01 ENCOUNTER — Other Ambulatory Visit (INDEPENDENT_AMBULATORY_CARE_PROVIDER_SITE_OTHER)

## 2023-09-01 ENCOUNTER — Ambulatory Visit: Payer: Self-pay | Admitting: Family Medicine

## 2023-09-01 DIAGNOSIS — R300 Dysuria: Secondary | ICD-10-CM

## 2023-09-01 DIAGNOSIS — N390 Urinary tract infection, site not specified: Secondary | ICD-10-CM | POA: Diagnosis not present

## 2023-09-01 DIAGNOSIS — R350 Frequency of micturition: Secondary | ICD-10-CM | POA: Diagnosis not present

## 2023-09-01 LAB — URINALYSIS, ROUTINE W REFLEX MICROSCOPIC
Bilirubin Urine: NEGATIVE
Ketones, ur: NEGATIVE
Leukocytes,Ua: NEGATIVE
Nitrite: NEGATIVE
Specific Gravity, Urine: 1.01 (ref 1.000–1.030)
Total Protein, Urine: 100 — AB
Urine Glucose: 100 — AB
Urobilinogen, UA: 0.2 (ref 0.0–1.0)
pH: 6.5 (ref 5.0–8.0)

## 2023-09-02 LAB — URINE CULTURE
MICRO NUMBER:: 16832122
Result:: NO GROWTH
SPECIMEN QUALITY:: ADEQUATE

## 2023-09-05 ENCOUNTER — Other Ambulatory Visit: Payer: Self-pay | Admitting: Family Medicine

## 2023-09-05 MED ORDER — VALSARTAN 320 MG PO TABS: 320.0000 mg | ORAL_TABLET | Freq: Every day | ORAL | 3 refills | Status: DC

## 2023-09-05 NOTE — Telephone Encounter (Signed)
 Spoke with patient. Agreeable to higher dose. Sent to pharmacy today. Pt aware to take two 160mg  tabs until her 320mg  script gets delivered to her home. Pt verbalized understanding and no further questions at this time.

## 2023-09-12 ENCOUNTER — Other Ambulatory Visit: Payer: Self-pay | Admitting: Family Medicine

## 2023-09-12 DIAGNOSIS — H9210 Otorrhea, unspecified ear: Secondary | ICD-10-CM

## 2023-09-12 NOTE — Telephone Encounter (Signed)
 Okay to send referral.

## 2023-09-23 ENCOUNTER — Encounter (INDEPENDENT_AMBULATORY_CARE_PROVIDER_SITE_OTHER): Payer: Self-pay | Admitting: Physician Assistant

## 2023-09-23 ENCOUNTER — Ambulatory Visit (INDEPENDENT_AMBULATORY_CARE_PROVIDER_SITE_OTHER): Admitting: Physician Assistant

## 2023-09-23 VITALS — BP 171/84 | HR 80

## 2023-09-23 DIAGNOSIS — I10 Essential (primary) hypertension: Secondary | ICD-10-CM

## 2023-09-23 DIAGNOSIS — H9212 Otorrhea, left ear: Secondary | ICD-10-CM | POA: Diagnosis not present

## 2023-09-23 MED ORDER — OFLOXACIN 0.3 % OT SOLN: 5.0000 [drp] | Freq: Every day | OTIC | 0 refills | Status: AC

## 2023-09-23 MED ORDER — CIPROFLOXACIN-DEXAMETHASONE 0.3-0.1 % OT SUSP: 4.0000 [drp] | Freq: Two times a day (BID) | OTIC | 0 refills | Status: AC

## 2023-09-23 NOTE — Progress Notes (Signed)
 Dear Dr. Katrinka, Here is my assessment for our mutual patient, Meagan [name]. Thank you for allowing me the opportunity to care for your patient. Please do not hesitate to contact me should you have any other questions. Sincerely, Chyrl Cohen PA-C  Otolaryngology Clinic Note Referring provider: Dr. Katrinka HPI:  Meagan [name] is a 78 y.o. female kindly referred by Dr. Katrinka   The patient is a 78 year old female seen in our office for evaluation of ear infection.  The patient notes a significant past medical history of recurrent ear infections as a child.  She notes that she has a longstanding history of left-sided tympanic membrane perforation from a previous ear infection.  She had been followed by Dr. Roark for this.  She notes that generally when she does get ear infections she is placed on otic drops which seem to resolve her symptoms.  She notes that she has not had an infection in several years.  She notes she has been very cautious about avoiding water in her ears but notes that she did get some shower water in her ear approximately 3 weeks ago.  Since that time she has had some yellowish drainage from the left ear some minimal discomfort.  She notes baseline significant decreased hearing out of the left ear with no significant changes to her baseline.  She denies any fever.   Independent Review of Additional Tests or Records:  Office visit note [facility] on 08/28/2015   PMH/Meds/All/SocHx/FamHx/ROS:   Past Medical History:  Diagnosis Date   Anemia    hx    Anxiety    no meds   Arthritis    knees   Cancer (HCC)    endometrial cancer   Diabetes mellitus without complication (HCC)    on oral medication   GERD (gastroesophageal reflux disease)    pepcid  now/famotidine    Hypertension    SVD (spontaneous vaginal delivery)    x 1   UTI (lower urinary tract infection) 11/22/2011   started abx on 11/24/11     Past Surgical History:  Procedure  Laterality Date   CHOLECYSTECTOMY     DILATATION & CURRETTAGE/HYSTEROSCOPY WITH RESECTOCOPE  11/30/2011   Procedure: DILATATION & CURETTAGE/HYSTEROSCOPY WITH RESECTOCOPE;  Surgeon: Montie SHAUNNA Chesterfield, MD;  Location: [facility];  Service: Gynecology;  Laterality: N/A;   DILATION AND CURETTAGE OF UTERUS     LYMPH NODE DISSECTION  12/14/2011   Procedure: LYMPH NODE DISSECTION;  Surgeon: Elenore A. Dodie, MD;  Location: [facility];  Service: Gynecology;  Laterality: N/A;   ROBOTIC ASSISTED TOTAL HYSTERECTOMY WITH BILATERAL SALPINGO OOPHERECTOMY  12/14/2011   Procedure: ROBOTIC ASSISTED TOTAL HYSTERECTOMY WITH BILATERAL SALPINGO OOPHORECTOMY;  Surgeon: Elenore A. Dodie, MD;  Location: [facility];  Service: Gynecology;  Laterality: N/A;   WISDOM TOOTH EXTRACTION      Family History  Problem Relation Age of Onset   Heart disease Mother    Heart disease Father    Diabetes Sister        decline after fall   Other Sister        old age, delcined after husband loss   Diabetes Sister        later had fall   Cancer Paternal Aunt        started in arm reportedly     Social Connections: Not on file      Current Outpatient Medications:    acetaminophen  (TYLENOL ) 500 MG tablet, Take 500 mg by mouth every 6 (  six) hours as needed for mild pain or moderate pain., Disp: , Rfl:    aspirin  EC 81 MG tablet, Take 81 mg by mouth daily. Swallow whole., Disp: , Rfl:    doxazosin  (CARDURA ) 2 MG tablet, Take 1 tablet (2 mg total) by mouth at bedtime., Disp: 30 tablet, Rfl: 0   famotidine  (PEPCID ) 20 MG tablet, TAKE 1 TABLET TWICE DAILY, Disp: 180 tablet, Rfl: 3   glimepiride  (AMARYL ) 4 MG tablet, TAKE 1 TABLET TWICE DAILY, Disp: 180 tablet, Rfl: 3   methenamine  (HIPREX ) 1 g tablet, Take 1 tablet (1 g total) by mouth 2 (two) times daily with a meal., Disp: 60 tablet, Rfl: 5   nitrofurantoin , macrocrystal-monohydrate, (MACROBID ) 100 MG capsule, Take 1 capsule (100 mg total) by mouth 2 (two) times daily., Disp: 14 capsule, Rfl:  0   valsartan  (DIOVAN ) 160 MG tablet, Take 0.5 tablets (80 mg total) by mouth daily., Disp: 90 tablet, Rfl: 3   valsartan  (DIOVAN ) 320 MG tablet, Take 1 tablet (320 mg total) by mouth daily., Disp: 90 tablet, Rfl: 3   Physical Exam:   BP (!) 171/84 Comment: first attempt 171/84 second attempt 177/87 pt stated she has not eaten and hasnt taken bp meds this morning.  Pulse 80   LMP 01/18/2001   SpO2 97%   Pertinent Findings  CN II-XII intact Left external auditory canal with minimal purulence, left TM with approximate 50% rupture along the inferior portion with purulence noted, right EAC clear, TM intact with well-pneumatized middle ear space Anterior rhinoscopy: Septum midline; bilateral inferior turbinates with no hypertrophy hypertrophy No lesions of oral cavity/oropharynx; dentition within normal limits No obviously palpable neck masses/lymphadenopathy/thyromegaly No respiratory distress or stridor  Seprately Identifiable Procedures:  None  Impression & Plans:  [name] is a 78 y.o. female with the following   Otorrhea-  78 year old female with longstanding history of TM perforation.  She does have signs of infection on exam today.  I have prescribed Ciprodex  drops for her.  I received information from the pharmacy that this would not be covered, I have placed an order for ofloxacin .  Like to see her back in the office in 2 weeks if her symptoms do not improve, would like to see her back sooner if they worsen.  I also discussed the option of surgical management, she notes that she has reviewed this previously with previous surgeons and is adamant she wants no surgery of the left ear.  The patient verbalized understanding and agreement to today's plan.  Patient was also noted to be hypertensive today.  Asymptomatic, she did not take her blood pressure medication today.  She will check her blood pressure again later if it remains persistent despite antihypertensive she will reach  out to her primary care provider      - f/u 2 weeks if symptoms persist     Thank you for allowing me the opportunity to care for your patient. Please do not hesitate to contact me should you have any other questions.  Sincerely, Chyrl Cohen PA-C [facility] Phone: [phone] Fax: [phone]  09/23/2023, 8:31 AM

## 2023-09-27 ENCOUNTER — Telehealth (INDEPENDENT_AMBULATORY_CARE_PROVIDER_SITE_OTHER): Payer: Self-pay

## 2023-09-27 NOTE — Telephone Encounter (Signed)
 Pt confirmed DOB Pt stated she was able to receive the medication

## 2023-10-05 ENCOUNTER — Encounter (INDEPENDENT_AMBULATORY_CARE_PROVIDER_SITE_OTHER): Payer: Self-pay | Admitting: Physician Assistant

## 2023-10-05 ENCOUNTER — Ambulatory Visit (INDEPENDENT_AMBULATORY_CARE_PROVIDER_SITE_OTHER): Admitting: Physician Assistant

## 2023-10-05 VITALS — BP 152/74 | HR 80 | Temp 97.8°F

## 2023-10-05 DIAGNOSIS — H7290 Unspecified perforation of tympanic membrane, unspecified ear: Secondary | ICD-10-CM

## 2023-10-05 DIAGNOSIS — H9212 Otorrhea, left ear: Secondary | ICD-10-CM

## 2023-10-05 DIAGNOSIS — R42 Dizziness and giddiness: Secondary | ICD-10-CM

## 2023-10-05 NOTE — Progress Notes (Signed)
 Dear Dr. Katrinka, Here is my assessment for our mutual patient, Meagan [name]. Thank you for allowing me the opportunity to care for your patient. Please do not hesitate to contact me should you have any other questions. Sincerely, Chyrl Cohen PA-C  Otolaryngology Clinic Note Referring provider: Dr. Katrinka HPI:  Meagan [name] is a 78 y.o. female kindly referred by Dr. Katrinka   The patient is a 78 year old female seen in our office for follow-up evaluation of left-sided ear infection.  The patient was last seen in the office on 09/23/2023.  Below is a recap of that encounter.  The patient is a 78 year old female seen in our office for evaluation of ear infection.  The patient notes a significant past medical history of recurrent ear infections as a child.  She notes that she has a longstanding history of left-sided tympanic membrane perforation from a previous ear infection.  She had been followed by Dr. Roark for this.  She notes that generally when she does get ear infections she is placed on otic drops which seem to resolve her symptoms.  She notes that she has not had an infection in several years.  She notes she has been very cautious about avoiding water in her ears but notes that she did get some shower water in her ear approximately 3 weeks ago.  Since that time she has had some yellowish drainage from the left ear some minimal discomfort.  She notes baseline significant decreased hearing out of the left ear with no significant changes to her baseline.  She denies any fever.  Update 10/05/2023  Since her last office visit she denies any significant drainage.  She notes she had used the ofloxacin  drops.  She denies any significant discomfort.  She does note some ongoing positional vertigo which she has had previously.  She notes mostly this is when she is getting up and out of bed.  She does not have any difficulty with walking.  She does have myelopathy from cervical stenosis.  No associated new  neurologic symptoms.    Independent Review of Additional Tests or Records:  None   PMH/Meds/All/SocHx/FamHx/ROS:   Past Medical History:  Diagnosis Date   Anemia    hx    Anxiety    no meds   Arthritis    knees   Cancer (HCC)    endometrial cancer   Diabetes mellitus without complication (HCC)    on oral medication   GERD (gastroesophageal reflux disease)    pepcid  now/famotidine    Hypertension    SVD (spontaneous vaginal delivery)    x 1   UTI (lower urinary tract infection) 11/22/2011   started abx on 11/24/11     Past Surgical History:  Procedure Laterality Date   CHOLECYSTECTOMY     DILATATION & CURRETTAGE/HYSTEROSCOPY WITH RESECTOCOPE  11/30/2011   Procedure: DILATATION & CURETTAGE/HYSTEROSCOPY WITH RESECTOCOPE;  Surgeon: Montie SHAUNNA Chesterfield, MD;  Location: [facility];  Service: Gynecology;  Laterality: N/A;   DILATION AND CURETTAGE OF UTERUS     LYMPH NODE DISSECTION  12/14/2011   Procedure: LYMPH NODE DISSECTION;  Surgeon: Elenore A. Dodie, MD;  Location: [facility];  Service: Gynecology;  Laterality: N/A;   ROBOTIC ASSISTED TOTAL HYSTERECTOMY WITH BILATERAL SALPINGO OOPHERECTOMY  12/14/2011   Procedure: ROBOTIC ASSISTED TOTAL HYSTERECTOMY WITH BILATERAL SALPINGO OOPHORECTOMY;  Surgeon: Elenore A. Dodie, MD;  Location: [facility];  Service: Gynecology;  Laterality: N/A;   WISDOM TOOTH EXTRACTION      Family History  Problem Relation  Age of Onset   Heart disease Mother    Heart disease Father    Diabetes Sister        decline after fall   Other Sister        old age, delcined after husband loss   Diabetes Sister        later had fall   Cancer Paternal Aunt        started in arm reportedly     Social Connections: Not on file      Current Outpatient Medications:    acetaminophen  (TYLENOL ) 500 MG tablet, Take 500 mg by mouth every 6 (six) hours as needed for mild pain or moderate pain., Disp: , Rfl:    aspirin  EC 81 MG tablet, Take 81 mg by mouth daily. Swallow whole.,  Disp: , Rfl:    doxazosin  (CARDURA ) 2 MG tablet, Take 1 tablet (2 mg total) by mouth at bedtime., Disp: 30 tablet, Rfl: 0   famotidine  (PEPCID ) 20 MG tablet, TAKE 1 TABLET TWICE DAILY, Disp: 180 tablet, Rfl: 3   glimepiride  (AMARYL ) 4 MG tablet, TAKE 1 TABLET TWICE DAILY, Disp: 180 tablet, Rfl: 3   methenamine  (HIPREX ) 1 g tablet, Take 1 tablet (1 g total) by mouth 2 (two) times daily with a meal., Disp: 60 tablet, Rfl: 5   nitrofurantoin , macrocrystal-monohydrate, (MACROBID ) 100 MG capsule, Take 1 capsule (100 mg total) by mouth 2 (two) times daily., Disp: 14 capsule, Rfl: 0   valsartan  (DIOVAN ) 160 MG tablet, Take 0.5 tablets (80 mg total) by mouth daily., Disp: 90 tablet, Rfl: 3   valsartan  (DIOVAN ) 320 MG tablet, Take 1 tablet (320 mg total) by mouth daily., Disp: 90 tablet, Rfl: 3   Physical Exam:   LMP 01/18/2001   Pertinent Findings  CN II-XII intact-no nystagmus Left external auditory canal with no purulence or drainage, left TM with approximate 50% rupture along the inferior portion =, right EAC clear, TM intact with well-pneumatized middle ear space Anterior rhinoscopy: Septum midline; bilateral inferior turbinates with no hypertrophy hypertrophy No lesions of oral cavity/oropharynx; dentition within normal limits No obviously palpable neck masses/lymphadenopathy/thyromegaly No respiratory distress or stridor  Seprately Identifiable Procedures:  None  Impression & Plans:  Meagan [name] is a 78 y.o. female with the following   Otorrhea-  Resolved with ofloxacin .  Persistent TM perforation.  Patient has had this for an extensive period of time and again does not want to pursue any further intervention for this.  Vertigo-  Symptoms most consistent with benign paroxysmal positional vertigo.  She has had this before.  I did recommend neuro rehab, she notes very advanced cervical spinal stenosis and myelopathy.  Any significant cervical movement is not recommended.  I also  offered meclizine but given her myelopathy she is concerned with any medication that may cause sedation.  Fortunately the symptoms are mostly prominent when sitting up and lying down, lower suspicion for any blood pressure related issues.  She will continue to monitor symptoms follow-up as needed.   - f/u PRN   Thank you for allowing me the opportunity to care for your patient. Please do not hesitate to contact me should you have any other questions.  Sincerely, Chyrl Cohen PA-C [facility] Phone: [phone] Fax: [phone]  10/05/2023, 1:00 PM

## 2023-11-01 ENCOUNTER — Institutional Professional Consult (permissible substitution) (INDEPENDENT_AMBULATORY_CARE_PROVIDER_SITE_OTHER): Admitting: Otolaryngology

## 2023-12-16 ENCOUNTER — Other Ambulatory Visit: Payer: Self-pay | Admitting: Family Medicine

## 2023-12-22 ENCOUNTER — Ambulatory Visit: Payer: Self-pay

## 2023-12-22 NOTE — Telephone Encounter (Signed)
 Patient requesting to drop off urine sample since its hard for her to come into office due to other diagnosis.

## 2023-12-22 NOTE — Telephone Encounter (Signed)
 I would be open urinalysis and urine culture under urinary frequency.  Ideally we would set up a video visit at the end of the day to discuss the results-otherwise I will simply wait on the urine culture to ultimately determine disposition which will likely be Monday or Tuesday

## 2023-12-22 NOTE — Telephone Encounter (Signed)
 FYI Only or Action Required?: Action required by provider: clinical question for provider and update on patient condition.  Patient was last seen in primary care on 07/21/2023 by [name], MD.  Called Nurse Triage reporting Urinary Frequency.  Symptoms began today.  Interventions attempted: Rest, hydration, or home remedies.  Symptoms are: gradually worsening.  Triage Disposition: See Physician Within 24 Hours  Patient/caregiver understands and will follow disposition?: No, wishes to speak with PCP  Copied from CRM #[id number]. Topic: Clinical - Red Word Triage >> Dec 22, 2023  1:27 PM Brittany M wrote: Red Word that prompted transfer to Nurse Triage: Patient Husband calling in- spinal synopsis- possible UTI- wanting to drop of a urine specimen- she gets frequent uti - burning and painful when urinating. Reason for Disposition  More than 2 UTI's in last year  Answer Assessment - Initial Assessment Questions 1. SEVERITY: How bad is the pain?  (e.g., Scale 1-10; mild, moderate, or severe)     Pt's husband called in with pt on speaker phone reporting pt is experiencing urinary frequency with burning and pain. Pt denies fever, blood in urine or abdominal distention r/t retention. Pt is currently drinking fluids. Pt's husband, [name], requests to bring urine specimen to office for urinalysis. States that pt has spinal stenosis and Dr. Katrinka allows him to perform specimen collection for urinalysis. Per chart several urinalysis have been performed. Home care instructions provided and pt's husband provided with office hours. He states he will bring urine sample to clinic tomorrow. Please advise.  Protocols used: Urination Pain - Female-A-AH

## 2023-12-23 ENCOUNTER — Other Ambulatory Visit: Payer: Self-pay

## 2023-12-23 ENCOUNTER — Other Ambulatory Visit

## 2023-12-23 ENCOUNTER — Encounter: Payer: Self-pay | Admitting: Family Medicine

## 2023-12-23 ENCOUNTER — Telehealth: Admitting: Family Medicine

## 2023-12-23 VITALS — Wt 173.0 lb

## 2023-12-23 DIAGNOSIS — I10 Essential (primary) hypertension: Secondary | ICD-10-CM | POA: Diagnosis not present

## 2023-12-23 DIAGNOSIS — E1165 Type 2 diabetes mellitus with hyperglycemia: Secondary | ICD-10-CM | POA: Diagnosis not present

## 2023-12-23 DIAGNOSIS — R350 Frequency of micturition: Secondary | ICD-10-CM

## 2023-12-23 DIAGNOSIS — N3001 Acute cystitis with hematuria: Secondary | ICD-10-CM | POA: Diagnosis not present

## 2023-12-23 LAB — POC URINALSYSI DIPSTICK (AUTOMATED)
Bilirubin, UA: NEGATIVE
Blood, UA: 2
Glucose, UA: POSITIVE — AB
Ketones, UA: NEGATIVE
Nitrite, UA: NEGATIVE
Protein, UA: POSITIVE — AB
Spec Grav, UA: 1.015 (ref 1.010–1.025)
Urobilinogen, UA: NEGATIVE U/dL — AB
pH, UA: 6 (ref 5.0–8.0)

## 2023-12-23 MED ORDER — NITROFURANTOIN MONOHYD MACRO 100 MG PO CAPS: 100.0000 mg | ORAL_CAPSULE | Freq: Two times a day (BID) | ORAL | 0 refills | Status: AC

## 2023-12-23 NOTE — Progress Notes (Signed)
 Phone [phone] Virtual visit via Video note   Subjective:  Chief complaint: Chief Complaint  Patient presents with   Nocturia    Having urine frequency and pain started yesterday. Patient dropped off urine today. No fever.     Our team/I connected with Meagan [name] at  4:20 PM EST by a video enabled telemedicine application (caregility through epic) and verified that I am speaking with the correct person using two identifiers. Our team/I discussed the limitations of evaluation and management by telemedicine and the availability of in person appointments.No physical exam was performed (except for noted visual exam or audio findings with Telehealth visits).   Location patient: [place] Location provider: [facility], office Persons participating in the virtual visit:  patient  Past Medical History-  Patient Active Problem List   Diagnosis Date Noted   E coli bacteremia 04/17/2021    Priority: High   History of TIA (transient ischemic attack) 04/15/2021    Priority: High   Diabetes mellitus, type 2 (HCC) 02/12/2015    Priority: High   Normocytic anemia 04/16/2021    Priority: Medium    Acid reflux 02/12/2015    Priority: Medium    Hyperlipidemia associated with type 2 diabetes mellitus (HCC) 02/12/2015    Priority: Medium    Primary hypertension 12/08/2011    Priority: Medium    GERD (gastroesophageal reflux disease) 03/03/2021    Priority: Low   Dermatitis, eczematoid 02/12/2015    Priority: Low   History of endometrial cancer 12/08/2011    Priority: Low    Medications- reviewed and updated Current Outpatient Medications  Medication Sig Dispense Refill   acetaminophen  (TYLENOL ) 500 MG tablet Take 500 mg by mouth every 6 (six) hours as needed for mild pain or moderate pain.     aspirin  EC 81 MG tablet Take 81 mg by mouth daily. Swallow whole.     doxazosin  (CARDURA ) 2 MG tablet Take 1 tablet (2 mg total) by mouth at bedtime. 30 tablet 0   famotidine  (PEPCID ) 20 MG  tablet TAKE 1 TABLET TWICE DAILY 180 tablet 3   glimepiride  (AMARYL ) 4 MG tablet TAKE 1 TABLET TWICE DAILY 180 tablet 3   methenamine  (HIPREX ) 1 g tablet Take 1 tablet (1 g total) by mouth 2 (two) times daily with a meal. 60 tablet 5   nitrofurantoin , macrocrystal-monohydrate, (MACROBID ) 100 MG capsule Take 1 capsule (100 mg total) by mouth 2 (two) times daily for 7 days. 14 capsule 0   valsartan  (DIOVAN ) 320 MG tablet Take 1 tablet (320 mg total) by mouth daily. 90 tablet 3   No current facility-administered medications for this visit.     Objective:  Wt 173 lb (78.5 kg)   LMP 01/18/2001   BMI 31.64 kg/m  self reported vitals Gen: NAD, resting comfortably Lungs: nonlabored, normal respiratory rate  Skin: appears dry, no obvious rash  Results for orders placed or performed in visit on 12/23/23 (from the past 24 hours)  POCT Urinalysis Dipstick (Automated)     Status: Abnormal   Collection Time: 12/23/23  1:19 PM  Result Value Ref Range   Color, UA Light Yellow    Clarity, UA Turbid    Glucose, UA Positive (A) Negative   Bilirubin, UA neg    Ketones, UA neg    Spec Grav, UA 1.015 1.010 - 1.025   Blood, UA 2    pH, UA 6.0 5.0 - 8.0   Protein, UA Positive (A) Negative   Urobilinogen, UA negative (A)  0.2 or 1.0 E.U./dL   Nitrite, UA neg    Leukocytes, UA Moderate (2+) (A) Negative       Assessment and Plan   # Recurrent UTI-on methenamine  in past she did not restart as planned #Concern for UTI S: Patients symptoms started yesterday- wants to catch early.  Complains of dysuria: YES; polyuria: YES; nocturia: YES more than usual; urgency: YES.  Symptoms are worsening.  ROS- no fever, chills, nausea, vomiting, flank pain. No blood in urine.   A/P: UA concerning for UTI. Will get culture. Empiric treatment with: Nitrofurantoin  for 7 days-she did well with this on last treatment Patient to follow up if new or worsening symptoms or failure to improve.  I also encouraged her with  recurrent UTI history to restart her methenamine  after she finishes antibiotics that she agrees-she thought she was post to hold it long-term but it was just to be held during prior antibiotic course -Potential blood in the urine-we need to recheck urine when she comes back in the office-could be related to urinary tract infection   # Cervical myelopathy due to C2-C7 cervical stenosis-follows with Dr. Carollee currently being monitored every 6 months typically-she has ongoing weakness related to this but her options are extremely limited.  She is essentially tolerating the pain manage did not surgery  # Very poorly controlled diabetes S: Medication: Glimepiride  4 mg twice daily, has declined all other medications despite very poor control of diabetes on the application including today  Summary of potential options: -Declines metformin worried about GI side effects -Declines Actos with family history of CHF -Declines insulin  but may be possibly interested depending on husband's course with new start-rely on options may make this more cost effective -Declines diabetes education - Cost is a major concern so declines Ozempic and with prior UTI issues do not think Jardiance is a good choice plus would be costly - Did agree to [facility] consult then later declined Lab Results  Component Value Date   HGBA1C 9.4 (H) 07/14/2023   HGBA1C 8.1 (H) 04/05/2023   HGBA1C 10.8 (A) 11/26/2022      A/P: Very poor control of diabetes and overdue for follow-up.  We discussed how high glucose including glucosuria noted increases her risk of UTIs and strongly encouraged improved control-discussed some potential options and she again declines and only wants to work on lifestyle-encourage close follow-up in office   #hypertension S: medication:  doxazosin  2 mg, valsartan  80 mg--> 320 mg -prior on triamterene  hctz but with hospitalization thought dehydrating potentially and stopped in 2023 BP Readings from Last 3  Encounters:  10/05/23 (!) 152/74  09/23/23 (!) 171/84  08/16/23 (!) 171/75  A/P: Blood pressure has been poorly controlled.  We had increased her valsartan  so I am hoping that may help-she needs an in person visit so we can reevaluate the blood pressure-she did not have a cuff available at time of visit      Recommended follow up: Recommended ASAP in office visit No future appointments.  Lab/Order associations:   ICD-10-CM   1. Acute cystitis with hematuria  N30.01     2. Primary hypertension  I10     3. Type 2 diabetes mellitus with hyperglycemia, without long-term current use of insulin  (HCC)  E11.65     4. Urine frequency  R35.0 Urine Culture    POCT Urinalysis Dipstick (Automated)      Meds ordered this encounter  Medications   nitrofurantoin , macrocrystal-monohydrate, (MACROBID ) 100 MG capsule  Sig: Take 1 capsule (100 mg total) by mouth 2 (two) times daily for 7 days.    Dispense:  14 capsule    Refill:  0   Return precautions advised.  Garnette Lukes, MD

## 2023-12-23 NOTE — Telephone Encounter (Signed)
 Patient as appointment at another [facility] facility today.

## 2023-12-25 LAB — URINE CULTURE
MICRO NUMBER:: 17318822
SPECIMEN QUALITY:: ADEQUATE

## 2023-12-26 ENCOUNTER — Ambulatory Visit: Payer: Self-pay | Admitting: Family Medicine

## 2024-01-09 ENCOUNTER — Ambulatory Visit: Payer: Self-pay

## 2024-01-09 NOTE — Telephone Encounter (Addendum)
 FYI Only or Action Required?: Action required by provider: clinical question for provider and would like a call back.  Patient was last seen in primary care on 12/23/2023 by [name], MD.  Called Nurse Triage reporting Recurrent UTI.  Symptoms began ongoing.  Interventions attempted: Nothing.  Symptoms are: gradually worsening.  Triage Disposition: Go to ED Now (or PCP Triage)  Patient/caregiver understands and will follow disposition?:   Writer called [facility] and notified that pt refused ED disposition.   Reason for Disposition  Vomiting  Answer Assessment - Initial Assessment Questions Patient states when I turns my head a certain way I get dizzy - pt threw up this morning. Denies fever. Writer advised that the patient go to the ED based on reported symptoms. Patient refused and states I don't want to sit at the hospital. Writer explained rationale that pt could be septic again. Patient requesting to drop off urine sample in the clinic to verify if pt still has a UTI. Writer states she will send a message to her PCP. Pt requesting a call back.   Finished abx 12/30/23  1. SEVERITY: How bad is the pain?  (e.g., Scale 1-10; mild, moderate, or severe)     denies 2. FREQUENCY: How many times have you had painful urination today?      N/a  3. PATTERN: Is pain present every time you urinate or just sometimes?      denies 4. ONSET: When did the painful urination start?      On going 5. FEVER: Do you have a fever? If Yes, ask: What is your temperature, how was it measured, and when did it start?     denies 6. PAST UTI: Have you had a urine infection before? If Yes, ask: When was the last time? and What happened that time?      yes 7. CAUSE: What do you think is causing the painful urination?  (e.g., UTI, scratch, Herpes sore)     N/a 8. OTHER SYMPTOMS: Do you have any other symptoms? (e.g., blood in urine, flank pain, genital sores, urgency, vaginal  discharge)     Nausea, dizziness, vomiting  Protocols used: Urination Pain - Female-A-AH  Patient disconnected from specialist. Writer attempted to reach the patient, voicemail left.   Copied from CRM #[id number]. Topic: Clinical - Red Word Triage >> Jan 09, 2024  8:54 AM Macario HERO wrote: Red Word that prompted transfer to Nurse Triage: Patient spouse called said she has a possible UTI, feeling dizzy and nauseous.

## 2024-01-09 NOTE — Telephone Encounter (Signed)
 Pt states I have already talked to two other people, I just want to know if I can bring a urine sample into the clinic.  Please advise pt of PCP decision.

## 2024-01-09 NOTE — Telephone Encounter (Signed)
 Please see patient message and advise on if okay to bring urine sample to office without an appointment.

## 2024-01-09 NOTE — Telephone Encounter (Signed)
 Symptoms very concerning with her history- she stated if turns head a certain way she vomits- I agree with Emergency Department   We worked around concern last time with urinary tract infection alone but these are new symptoms. Even after last urinary tract infection recommended You do have a urinary tract infection but thankfully you are already on the right antibiotic-please complete the course and follow-up for in person visit if symptoms fail to improve  so basically I think she needs an in person visit somewhere ASAP and with vomiting issue with head movement I agree with Emergency Department this time

## 2024-01-09 NOTE — Telephone Encounter (Signed)
MyChart message sent to patient with this information.

## 2024-01-10 ENCOUNTER — Ambulatory Visit: Admitting: Family Medicine

## 2024-01-10 ENCOUNTER — Ambulatory Visit (INDEPENDENT_AMBULATORY_CARE_PROVIDER_SITE_OTHER): Admitting: Family Medicine

## 2024-01-10 ENCOUNTER — Encounter: Payer: Self-pay | Admitting: Family Medicine

## 2024-01-10 VITALS — BP 122/52 | HR 96 | Temp 99.0°F | Resp 12 | Ht 62.0 in | Wt 173.0 lb

## 2024-01-10 DIAGNOSIS — R52 Pain, unspecified: Secondary | ICD-10-CM | POA: Diagnosis not present

## 2024-01-10 DIAGNOSIS — B009 Herpesviral infection, unspecified: Secondary | ICD-10-CM | POA: Insufficient documentation

## 2024-01-10 DIAGNOSIS — E1165 Type 2 diabetes mellitus with hyperglycemia: Secondary | ICD-10-CM | POA: Insufficient documentation

## 2024-01-10 DIAGNOSIS — N3001 Acute cystitis with hematuria: Secondary | ICD-10-CM | POA: Diagnosis not present

## 2024-01-10 DIAGNOSIS — I10 Essential (primary) hypertension: Secondary | ICD-10-CM

## 2024-01-10 DIAGNOSIS — R1111 Vomiting without nausea: Secondary | ICD-10-CM | POA: Diagnosis not present

## 2024-01-10 DIAGNOSIS — C541 Malignant neoplasm of endometrium: Secondary | ICD-10-CM | POA: Insufficient documentation

## 2024-01-10 DIAGNOSIS — G959 Disease of spinal cord, unspecified: Secondary | ICD-10-CM | POA: Diagnosis not present

## 2024-01-10 MED ORDER — OSELTAMIVIR PHOSPHATE 75 MG PO CAPS: 75.0000 mg | ORAL_CAPSULE | Freq: Two times a day (BID) | ORAL | 0 refills | Status: DC

## 2024-01-10 MED ORDER — METHENAMINE HIPPURATE 1 G PO TABS: 1.0000 g | ORAL_TABLET | Freq: Two times a day (BID) | ORAL | 5 refills | Status: DC

## 2024-01-10 MED ORDER — ONDANSETRON 4 MG PO TBDP: 4.0000 mg | ORAL_TABLET | Freq: Three times a day (TID) | ORAL | 0 refills | Status: DC | PRN

## 2024-01-10 NOTE — Progress Notes (Deleted)
" ° °  Acute Office Visit  Subjective:     Patient ID: Meagan [name], female    DOB: [birth year], 78 y.o.   MRN: [id number]  No chief complaint on file.   HPI  Discussed the use of AI scribe software for clinical note transcription with the patient, who gave verbal consent to proceed.  History of Present Illness      ROS Per HPI      Objective:    LMP 01/18/2001    Physical Exam Vitals and nursing note reviewed.  Constitutional:      General: She is not in acute distress.    Appearance: Normal appearance. She is normal weight.  HENT:     Head: Normocephalic and atraumatic.     Right Ear: External ear normal.     Left Ear: External ear normal.     Nose: Nose normal.     Mouth/Throat:     Mouth: Mucous membranes are moist.     Pharynx: Oropharynx is clear.  Eyes:     Extraocular Movements: Extraocular movements intact.     Pupils: Pupils are equal, round, and reactive to light.  Cardiovascular:     Rate and Rhythm: Normal rate and regular rhythm.     Pulses: Normal pulses.     Heart sounds: Normal heart sounds.  Pulmonary:     Effort: Pulmonary effort is normal. No respiratory distress.     Breath sounds: Normal breath sounds. No wheezing, rhonchi or rales.  Musculoskeletal:        General: Normal range of motion.     Cervical back: Normal range of motion.     Right lower leg: No edema.     Left lower leg: No edema.  Lymphadenopathy:     Cervical: No cervical adenopathy.  Neurological:     General: No focal deficit present.     Mental Status: She is alert and oriented to person, place, and time.  Psychiatric:        Mood and Affect: Mood normal.        Thought Content: Thought content normal.     No results found for any visits on 01/10/24.      Assessment & Plan:   Assessment and Plan Assessment & Plan      No orders of the defined types were placed in this encounter.    No orders of the defined types were placed in this  encounter.   No follow-ups on file.  Corean LITTIE Ku, FNP  "

## 2024-01-10 NOTE — Patient Instructions (Addendum)
 Lab today  I am very concerned that this could be influenza given your exposure as well as your cough, body aches, chills, fatigue.  We are going to treat with Tamiflu   With your nausea I am sending in ondansetron /Zofran  to try to help with that portion  You really need to push hydration as much water as you can and also perhaps some sugar-free popsicles to help with some electrolytes  Hold your valsartan  for now I think that could be contributing to your weakness sensation with blood pressure running lower than usual and it is not ideal when dehydrated-you can restart when you are feeling better.  If holding this within 24 hours does not help in blood pressure less than 130/80 you can also hold the doxazosin   Checking urine if you can bring back the urine with you.  Recommended follow up: Return for as needed for new, worsening, persistent symptoms. If you worsen please go to hospital- flu can be very serious

## 2024-01-10 NOTE — Progress Notes (Signed)
 " Phone [phone] In person visit   Subjective:   Meagan [name] is a 78 y.o. year old very pleasant female patient who presents for/with See problem oriented charting Chief Complaint  Patient presents with   Dizziness    Started Monday.    Nausea    Started on Monday. Expressed that she did have some vomiting. Denies fevers.    Shaking    Expressed that the shaking is on the inside.    Generalized Body Aches    All over    Past Medical History-  Patient Active Problem List   Diagnosis Date Noted   E coli bacteremia 04/17/2021    Priority: High   History of TIA (transient ischemic attack) 04/15/2021    Priority: High   Diabetes mellitus, type 2 (HCC) 02/12/2015    Priority: High   Normocytic anemia 04/16/2021    Priority: Medium    Stage 3a chronic kidney disease (HCC) 04/15/2021    Priority: Medium    Acid reflux 02/12/2015    Priority: Medium    Mixed hyperlipidemia 02/12/2015    Priority: Medium    Essential hypertension 12/08/2011    Priority: Medium    Gastroesophageal reflux disease without esophagitis 03/03/2021    Priority: Low   Seborrheic dermatitis 02/12/2015    Priority: Low   History of endometrial cancer 12/08/2011    Priority: Low   Herpesviral infection, unspecified 01/10/2024   Hyperglycemia due to type 2 diabetes mellitus (HCC) 01/10/2024   Cervical myelopathy (HCC) 01/10/2024   Otorrhea of left ear 01/05/2023   Perforation of left tympanic membrane 08/12/2015    Medications- reviewed and updated Current Outpatient Medications  Medication Sig Dispense Refill   acetaminophen  (TYLENOL ) 500 MG tablet Take 500 mg by mouth every 6 (six) hours as needed for mild pain or moderate pain.     aspirin  EC 81 MG tablet Take 81 mg by mouth daily. Swallow whole.     doxazosin  (CARDURA ) 2 MG tablet Take 1 tablet (2 mg total) by mouth at bedtime. 30 tablet 0   famotidine  (PEPCID ) 20 MG tablet TAKE 1 TABLET TWICE DAILY 180 tablet 3   glimepiride  (AMARYL )  4 MG tablet TAKE 1 TABLET TWICE DAILY 180 tablet 3   ondansetron  (ZOFRAN -ODT) 4 MG disintegrating tablet Take 1 tablet (4 mg total) by mouth every 8 (eight) hours as needed for nausea or vomiting. 20 tablet 0   oseltamivir  (TAMIFLU ) 75 MG capsule Take 1 capsule (75 mg total) by mouth 2 (two) times daily. 10 capsule 0   valsartan  (DIOVAN ) 320 MG tablet Take 1 tablet (320 mg total) by mouth daily. 90 tablet 3   methenamine  (HIPREX ) 1 g tablet Take 1 tablet (1 g total) by mouth 2 (two) times daily with a meal. 60 tablet 5   No current facility-administered medications for this visit.     Objective:  BP (!) 122/52   Pulse 96   Temp 99 F (37.2 C) (Temporal)   Resp 12   Ht 5' 2 (1.575 m)   Wt 173 lb (78.5 kg) Comment: Verbal: Taken yesterday.  LMP 01/18/2001   SpO2 99%   BMI 31.64 kg/m  Gen: NAD, resting comfortably Nasal turbinates erythematous with yellow discharge, pharynx with erythema noted incise and drainage.  Declines to remove hearing aids for your evaluation CV: RRR though heart rate in the 90s no murmurs rubs or gallops Lungs: CTAB no crackles, wheeze, rhonchi Ext: no edema Skin: warm, dry  Assessment and Plan    # nausea/vomiting/chills/body aches/cough S:started on Monday. On Sunday went to [employer] and had bbq chicken and had some fried okra that was greasy and some corn pudding and had dark chocolate almonds and stomach felt bad that evening and Monday started throwing up- no blood or bile. Progressive cough but no chest pain or shortness of breath. Also nasal congestion.  - some internal shaking- wonders if chills and temperature 99 here - doesn't think she's had fever though. Continues to feel nauseous. Not eating much- just a few crackers.  -feels generalized weakness as well- reports there is no dizziness- simply her balance is off as per baseline but worse with strength not as good -grandson with flu and she saw him Sunday- he got sick by Monday -she did not  have flu shot A/P: Patient since yesterday has not been feeling well she was concerned she could have recurrence of UTI but she has no current UTI symptoms.  I agreed to check her urine but also expressed my concern for viral illness with nausea vomiting chills body aches and cough-she agreed to flu and COVID testing which were negative but after this discovered that her grandson has influenza and was around her on Sunday which was before her symptom onset so we agreed to empirically treat with Tamiflu  but be proactive about nausea and vomiting since it can cause GI symptoms with Zofran .  I want to be cautious and be aggressive with Tamiflu  due to her age and being within 48 hours -reported dizziness by phone is more generalized weakness- she is using wheelchair today with level of fatigue she has though no chest pain or shortness of breath  -from avs  You really need to push hydration as much water as you can and also perhaps some sugar-free popsicles to help with some electrolytes  Hold your valsartan  for now I think that could be contributing to your weakness sensation with blood pressure running lower than usual and it is not ideal when dehydrated-you can restart when you are feeling better.  If holding this within 24 hours does not help in blood pressure less than 130/80 you can also hold the doxazosin   Checking urine if you can bring back the urine with you. As she could not pee today  # Cervical myelopathy due to C2-C7 cervical stenosis-follows with Dr. Carollee with ongoing weakness- seems to be exacerbated by her currently likely influenza. Continue to monitor and would encourage follow up with neurosurgery  # Very poorly controlled diabetes- reports sugars running about same even with illness- reports there is no lows- continue to monitor    #hypertension S: medication:  doxazosin  2 mg, valsartan  320 mg -prior on triamterene  hctz but with hospitalization thought dehydrating potentially and  stopped in 2023 BP Readings from Last 3 Encounters:  01/10/24 (!) 122/52  10/05/23 (!) 152/74  09/23/23 (!) 171/84  A/P: blood pressure much lower than usual- short term going to hold valsartan  as above and potentially doxazosin    # Recurrent UTI-on methenamine  in past- restart after illness is better   Recommended follow up: Return for as needed for new, worsening, persistent symptoms.  Lab/Order associations:   ICD-10-CM   1. Body aches  R52 Comprehensive metabolic panel with GFR    CBC with Differential/Platelet    2. Vomiting without nausea, unspecified vomiting type  R11.11 Comprehensive metabolic panel with GFR    CBC with Differential/Platelet    3. Acute cystitis with hematuria  N30.01  Urinalysis, Routine w reflex microscopic    Urine Culture    Urine Culture    Urinalysis, Routine w reflex microscopic    4. Cervical myelopathy (HCC)  G95.9     5. Primary hypertension  I10       Meds ordered this encounter  Medications   methenamine  (HIPREX ) 1 g tablet    Sig: Take 1 tablet (1 g total) by mouth 2 (two) times daily with a meal.    Dispense:  60 tablet    Refill:  5   ondansetron  (ZOFRAN -ODT) 4 MG disintegrating tablet    Sig: Take 1 tablet (4 mg total) by mouth every 8 (eight) hours as needed for nausea or vomiting.    Dispense:  20 tablet    Refill:  0   oseltamivir  (TAMIFLU ) 75 MG capsule    Sig: Take 1 capsule (75 mg total) by mouth 2 (two) times daily.    Dispense:  10 capsule    Refill:  0    Return precautions advised.  Garnette Lukes, MD  "

## 2024-01-11 ENCOUNTER — Other Ambulatory Visit

## 2024-01-11 LAB — URINALYSIS, ROUTINE W REFLEX MICROSCOPIC
Bilirubin Urine: NEGATIVE
Ketones, ur: NEGATIVE
Leukocytes,Ua: NEGATIVE
Nitrite: NEGATIVE
Specific Gravity, Urine: 1.02 (ref 1.000–1.030)
Total Protein, Urine: 100 — AB
Urine Glucose: 250 — AB
Urobilinogen, UA: 0.2 (ref 0.0–1.0)
pH: 6 (ref 5.0–8.0)

## 2024-01-11 LAB — COMPREHENSIVE METABOLIC PANEL WITH GFR
ALT: 35 U/L (ref 3–35)
AST: 36 U/L (ref 5–37)
Albumin: 3.7 g/dL (ref 3.5–5.2)
Alkaline Phosphatase: 89 U/L (ref 39–117)
BUN: 26 mg/dL — ABNORMAL HIGH (ref 6–23)
CO2: 26 meq/L (ref 19–32)
Calcium: 8.7 mg/dL (ref 8.4–10.5)
Chloride: 99 meq/L (ref 96–112)
Creatinine, Ser: 1.25 mg/dL — ABNORMAL HIGH (ref 0.40–1.20)
GFR: 41.66 mL/min — ABNORMAL LOW
Glucose, Bld: 208 mg/dL — ABNORMAL HIGH (ref 70–99)
Potassium: 4.7 meq/L (ref 3.5–5.1)
Sodium: 134 meq/L — ABNORMAL LOW (ref 135–145)
Total Bilirubin: 0.5 mg/dL (ref 0.2–1.2)
Total Protein: 7.2 g/dL (ref 6.0–8.3)

## 2024-01-11 LAB — CBC WITH DIFFERENTIAL/PLATELET
Basophils Absolute: 0.1 K/uL (ref 0.0–0.1)
Basophils Relative: 1.2 % (ref 0.0–3.0)
Eosinophils Absolute: 0.1 K/uL (ref 0.0–0.7)
Eosinophils Relative: 1.7 % (ref 0.0–5.0)
HCT: 37.3 % (ref 36.0–46.0)
Hemoglobin: 12.4 g/dL (ref 12.0–15.0)
Lymphocytes Relative: 11.2 % — ABNORMAL LOW (ref 12.0–46.0)
Lymphs Abs: 0.6 K/uL — ABNORMAL LOW (ref 0.7–4.0)
MCHC: 33.2 g/dL (ref 30.0–36.0)
MCV: 83.7 fl (ref 78.0–100.0)
Monocytes Absolute: 0.7 K/uL (ref 0.1–1.0)
Monocytes Relative: 12.9 % — ABNORMAL HIGH (ref 3.0–12.0)
Neutro Abs: 4.2 K/uL (ref 1.4–7.7)
Neutrophils Relative %: 73 % (ref 43.0–77.0)
Platelets: 228 K/uL (ref 150.0–400.0)
RBC: 4.46 Mil/uL (ref 3.87–5.11)
RDW: 13.3 % (ref 11.5–15.5)
WBC: 5.7 K/uL (ref 4.0–10.5)

## 2024-01-12 LAB — URINE CULTURE
MICRO NUMBER:: 17397550
Result:: NO GROWTH
SPECIMEN QUALITY:: ADEQUATE

## 2024-01-13 ENCOUNTER — Inpatient Hospital Stay (HOSPITAL_COMMUNITY)
Admission: EM | Admit: 2024-01-13 | Discharge: 2024-01-15 | DRG: 392 | Disposition: A | Discharge status: Home Health | Attending: Internal Medicine | Admitting: Internal Medicine

## 2024-01-13 ENCOUNTER — Emergency Department (HOSPITAL_COMMUNITY)

## 2024-01-13 ENCOUNTER — Encounter (HOSPITAL_COMMUNITY): Payer: Self-pay

## 2024-01-13 ENCOUNTER — Other Ambulatory Visit: Payer: Self-pay

## 2024-01-13 DIAGNOSIS — Z8249 Family history of ischemic heart disease and other diseases of the circulatory system: Secondary | ICD-10-CM

## 2024-01-13 DIAGNOSIS — J101 Influenza due to other identified influenza virus with other respiratory manifestations: Secondary | ICD-10-CM | POA: Diagnosis present

## 2024-01-13 DIAGNOSIS — Z8542 Personal history of malignant neoplasm of other parts of uterus: Secondary | ICD-10-CM

## 2024-01-13 DIAGNOSIS — Z880 Allergy status to penicillin: Secondary | ICD-10-CM

## 2024-01-13 DIAGNOSIS — Z7982 Long term (current) use of aspirin: Secondary | ICD-10-CM

## 2024-01-13 DIAGNOSIS — T68XXXA Hypothermia, initial encounter: Principal | ICD-10-CM

## 2024-01-13 DIAGNOSIS — Z888 Allergy status to other drugs, medicaments and biological substances status: Secondary | ICD-10-CM

## 2024-01-13 DIAGNOSIS — Z90710 Acquired absence of both cervix and uterus: Secondary | ICD-10-CM

## 2024-01-13 DIAGNOSIS — K858 Other acute pancreatitis without necrosis or infection: Secondary | ICD-10-CM

## 2024-01-13 DIAGNOSIS — I10 Essential (primary) hypertension: Secondary | ICD-10-CM | POA: Diagnosis present

## 2024-01-13 DIAGNOSIS — Z1152 Encounter for screening for COVID-19: Secondary | ICD-10-CM

## 2024-01-13 DIAGNOSIS — N179 Acute kidney failure, unspecified: Secondary | ICD-10-CM | POA: Diagnosis present

## 2024-01-13 DIAGNOSIS — K219 Gastro-esophageal reflux disease without esophagitis: Secondary | ICD-10-CM | POA: Diagnosis present

## 2024-01-13 DIAGNOSIS — R680 Hypothermia, not associated with low environmental temperature: Secondary | ICD-10-CM | POA: Diagnosis present

## 2024-01-13 DIAGNOSIS — Z91013 Allergy to seafood: Secondary | ICD-10-CM

## 2024-01-13 DIAGNOSIS — T375X5A Adverse effect of antiviral drugs, initial encounter: Secondary | ICD-10-CM | POA: Diagnosis present

## 2024-01-13 DIAGNOSIS — Z833 Family history of diabetes mellitus: Secondary | ICD-10-CM

## 2024-01-13 DIAGNOSIS — E871 Hypo-osmolality and hyponatremia: Secondary | ICD-10-CM | POA: Diagnosis present

## 2024-01-13 DIAGNOSIS — Z7984 Long term (current) use of oral hypoglycemic drugs: Secondary | ICD-10-CM

## 2024-01-13 DIAGNOSIS — R112 Nausea with vomiting, unspecified: Principal | ICD-10-CM | POA: Diagnosis present

## 2024-01-13 DIAGNOSIS — J102 Influenza due to other identified influenza virus with gastrointestinal manifestations: Secondary | ICD-10-CM | POA: Diagnosis present

## 2024-01-13 DIAGNOSIS — E1165 Type 2 diabetes mellitus with hyperglycemia: Secondary | ICD-10-CM | POA: Diagnosis present

## 2024-01-13 LAB — COMPREHENSIVE METABOLIC PANEL WITH GFR
ALT: 44 U/L (ref 0–44)
AST: 61 U/L — ABNORMAL HIGH (ref 15–41)
Albumin: 3.3 g/dL — ABNORMAL LOW (ref 3.5–5.0)
Alkaline Phosphatase: 87 U/L (ref 38–126)
Anion gap: 11 (ref 5–15)
BUN: 18 mg/dL (ref 8–23)
CO2: 20 mmol/L — ABNORMAL LOW (ref 22–32)
Calcium: 7.9 mg/dL — ABNORMAL LOW (ref 8.9–10.3)
Chloride: 93 mmol/L — ABNORMAL LOW (ref 98–111)
Creatinine, Ser: 1.07 mg/dL — ABNORMAL HIGH (ref 0.44–1.00)
GFR, Estimated: 53 mL/min — ABNORMAL LOW
Glucose, Bld: 84 mg/dL (ref 70–99)
Potassium: 3.4 mmol/L — ABNORMAL LOW (ref 3.5–5.1)
Sodium: 125 mmol/L — ABNORMAL LOW (ref 135–145)
Total Bilirubin: 0.3 mg/dL (ref 0.0–1.2)
Total Protein: 6.6 g/dL (ref 6.5–8.1)

## 2024-01-13 LAB — CBC
HCT: 36.3 % (ref 36.0–46.0)
Hemoglobin: 12.2 g/dL (ref 12.0–15.0)
MCH: 27.6 pg (ref 26.0–34.0)
MCHC: 33.6 g/dL (ref 30.0–36.0)
MCV: 82.1 fL (ref 80.0–100.0)
Platelets: 152 K/uL (ref 150–400)
RBC: 4.42 MIL/uL (ref 3.87–5.11)
RDW: 12.9 % (ref 11.5–15.5)
WBC: 3.3 K/uL — ABNORMAL LOW (ref 4.0–10.5)
nRBC: 0 % (ref 0.0–0.2)

## 2024-01-13 LAB — BASIC METABOLIC PANEL WITH GFR
Anion gap: 10 (ref 5–15)
Anion gap: 10 (ref 5–15)
BUN: 15 mg/dL (ref 8–23)
BUN: 17 mg/dL (ref 8–23)
CO2: 22 mmol/L (ref 22–32)
CO2: 25 mmol/L (ref 22–32)
Calcium: 8 mg/dL — ABNORMAL LOW (ref 8.9–10.3)
Calcium: 7.8 mg/dL — ABNORMAL LOW (ref 8.9–10.3)
Chloride: 100 mmol/L (ref 98–111)
Chloride: 100 mmol/L (ref 98–111)
Creatinine, Ser: 1.13 mg/dL — ABNORMAL HIGH (ref 0.44–1.00)
Creatinine, Ser: 1.18 mg/dL — ABNORMAL HIGH (ref 0.44–1.00)
GFR, Estimated: 50 mL/min — ABNORMAL LOW
GFR, Estimated: 47 mL/min — ABNORMAL LOW
Glucose, Bld: 117 mg/dL — ABNORMAL HIGH (ref 70–99)
Glucose, Bld: 211 mg/dL — ABNORMAL HIGH (ref 70–99)
Potassium: 4.1 mmol/L (ref 3.5–5.1)
Potassium: 4 mmol/L (ref 3.5–5.1)
Sodium: 132 mmol/L — ABNORMAL LOW (ref 135–145)
Sodium: 134 mmol/L — ABNORMAL LOW (ref 135–145)

## 2024-01-13 LAB — URINALYSIS, W/ REFLEX TO CULTURE (INFECTION SUSPECTED)
Bilirubin Urine: NEGATIVE
Glucose, UA: NEGATIVE mg/dL
Ketones, ur: NEGATIVE mg/dL
Leukocytes,Ua: NEGATIVE
Nitrite: NEGATIVE
Protein, ur: 100 mg/dL — AB
Specific Gravity, Urine: 1.004 — ABNORMAL LOW (ref 1.005–1.030)
pH: 5 (ref 5.0–8.0)

## 2024-01-13 LAB — GLUCOSE, CAPILLARY
Glucose-Capillary: 227 mg/dL — ABNORMAL HIGH (ref 70–99)
Glucose-Capillary: 173 mg/dL — ABNORMAL HIGH (ref 70–99)

## 2024-01-13 LAB — HEMOGLOBIN A1C
Hgb A1c MFr Bld: 9.6 % — ABNORMAL HIGH (ref 4.8–5.6)
Mean Plasma Glucose: 228.82 mg/dL

## 2024-01-13 LAB — PROTIME-INR
INR: 1 (ref 0.8–1.2)
Prothrombin Time: 13.8 s (ref 11.4–15.2)

## 2024-01-13 LAB — RESP PANEL BY RT-PCR (RSV, FLU A&B, COVID)  RVPGX2
Influenza A by PCR: POSITIVE — AB
Influenza B by PCR: NEGATIVE
Resp Syncytial Virus by PCR: NEGATIVE
SARS Coronavirus 2 by RT PCR: NEGATIVE

## 2024-01-13 LAB — CBG MONITORING, ED
Glucose-Capillary: 100 mg/dL — ABNORMAL HIGH (ref 70–99)
Glucose-Capillary: 120 mg/dL — ABNORMAL HIGH (ref 70–99)

## 2024-01-13 LAB — LIPASE, BLOOD: Lipase: 451 U/L — ABNORMAL HIGH (ref 11–51)

## 2024-01-13 LAB — TSH: TSH: 1.53 u[IU]/mL (ref 0.350–4.500)

## 2024-01-13 LAB — T4, FREE: Free T4: 0.99 ng/dL (ref 0.80–2.00)

## 2024-01-13 LAB — I-STAT CG4 LACTIC ACID, ED: Lactic Acid, Venous: 1.5 mmol/L (ref 0.5–1.9)

## 2024-01-13 MED ORDER — IRBESARTAN 150 MG PO TABS
150.0000 mg | ORAL_TABLET | Freq: Every day | ORAL | Status: DC
  Administered 2024-01-13 – 2024-01-14 (×2): 150 mg via ORAL
  Filled 2024-01-13 (×3): qty 1

## 2024-01-13 MED ORDER — ALBUTEROL SULFATE (2.5 MG/3ML) 0.083% IN NEBU
2.5000 mg | INHALATION_SOLUTION | Freq: Once | RESPIRATORY_TRACT | Status: AC
  Administered 2024-01-13: 2.5 mg via RESPIRATORY_TRACT
  Filled 2024-01-13: qty 3

## 2024-01-13 MED ORDER — ONDANSETRON 4 MG PO TBDP: 4.0000 mg | ORAL_TABLET | Freq: Three times a day (TID) | ORAL | Status: DC | PRN

## 2024-01-13 MED ORDER — OSELTAMIVIR PHOSPHATE 30 MG PO CAPS
30.0000 mg | ORAL_CAPSULE | Freq: Two times a day (BID) | ORAL | Status: DC
  Filled 2024-01-13 (×2): qty 1

## 2024-01-13 MED ORDER — ASPIRIN 81 MG PO TBEC
81.0000 mg | DELAYED_RELEASE_TABLET | Freq: Every day | ORAL | Status: DC
  Administered 2024-01-13 – 2024-01-14 (×2): 81 mg via ORAL
  Filled 2024-01-13 (×2): qty 1

## 2024-01-13 MED ORDER — INSULIN ASPART 100 UNIT/ML IJ SOLN
0.0000 [IU] | Freq: Three times a day (TID) | INTRAMUSCULAR | Status: DC
  Administered 2024-01-13: 5 [IU] via SUBCUTANEOUS
  Administered 2024-01-14: 3 [IU] via SUBCUTANEOUS
  Administered 2024-01-14: 2 [IU] via SUBCUTANEOUS
  Filled 2024-01-13: qty 2
  Filled 2024-01-13: qty 5
  Filled 2024-01-13: qty 3

## 2024-01-13 MED ORDER — IOHEXOL 350 MG/ML SOLN
75.0000 mL | Freq: Once | INTRAVENOUS | Status: AC | PRN
  Administered 2024-01-13: 75 mL via INTRAVENOUS

## 2024-01-13 MED ORDER — SODIUM CHLORIDE 0.9 % IV SOLN: INTRAVENOUS | Status: AC

## 2024-01-13 MED ORDER — LACTATED RINGERS IV BOLUS
1000.0000 mL | Freq: Once | INTRAVENOUS | Status: AC
  Administered 2024-01-13: 1000 mL via INTRAVENOUS

## 2024-01-13 MED ORDER — ONDANSETRON 4 MG PO TBDP
4.0000 mg | ORAL_TABLET | Freq: Once | ORAL | Status: DC | PRN
  Filled 2024-01-13: qty 1

## 2024-01-13 MED ORDER — ENOXAPARIN SODIUM 40 MG/0.4ML IJ SOSY
40.0000 mg | PREFILLED_SYRINGE | Freq: Every day | INTRAMUSCULAR | Status: DC
  Administered 2024-01-13 – 2024-01-14 (×2): 40 mg via SUBCUTANEOUS
  Filled 2024-01-13 (×2): qty 0.4

## 2024-01-13 MED ORDER — GLIMEPIRIDE 4 MG PO TABS
4.0000 mg | ORAL_TABLET | Freq: Two times a day (BID) | ORAL | Status: DC
  Administered 2024-01-13 – 2024-01-14 (×2): 4 mg via ORAL
  Filled 2024-01-13 (×4): qty 1

## 2024-01-13 MED ORDER — IRBESARTAN 300 MG PO TABS: 300.0000 mg | ORAL_TABLET | Freq: Every day | ORAL | Status: DC

## 2024-01-13 MED ORDER — FAMOTIDINE 20 MG PO TABS
20.0000 mg | ORAL_TABLET | Freq: Two times a day (BID) | ORAL | Status: DC
  Administered 2024-01-13 – 2024-01-15 (×5): 20 mg via ORAL
  Filled 2024-01-13 (×5): qty 1

## 2024-01-13 MED ORDER — INSULIN ASPART 100 UNIT/ML IJ SOLN: 0.0000 [IU] | Freq: Every day | INTRAMUSCULAR | Status: DC

## 2024-01-13 MED ORDER — DOXAZOSIN MESYLATE 2 MG PO TABS
2.0000 mg | ORAL_TABLET | Freq: Every day | ORAL | Status: DC
  Administered 2024-01-13 – 2024-01-14 (×2): 2 mg via ORAL
  Filled 2024-01-13 (×3): qty 1

## 2024-01-13 MED ORDER — ALBUTEROL SULFATE (2.5 MG/3ML) 0.083% IN NEBU
2.5000 mg | INHALATION_SOLUTION | Freq: Four times a day (QID) | RESPIRATORY_TRACT | Status: DC | PRN
  Administered 2024-01-13 – 2024-01-14 (×3): 2.5 mg via RESPIRATORY_TRACT
  Filled 2024-01-13 (×4): qty 3

## 2024-01-13 NOTE — ED Provider Notes (Addendum)
 " [place] [facility] Provider Note   CSN: [id number] Arrival date & time: 01/13/24  9967     Patient presents with: Diarrhea and Cough   Meagan [name] is a 78 y.o. female.   78 year old female brought in by EMS from home with complaint of weakness.  Patient ate out with family on Sunday at a [employer].  She and her grandson later developed vomiting and diarrhea.  She found out the following day on Monday that he tested positive for flu.  Patient was seen by PCP, was negative for COVID and flu at that time.  Was started on Tamiflu  and provided with Zofran  given expected vomiting and diarrhea secondary to Tamiflu .  Patient called her son Wednesday night with concern for worsening weakness, has spinal stenosis and uses a wheelchair at baseline as needed however was feeling more weak.  Son provided IV fluids at home, patient appeared pale, vitals normal, patient seemed to feel better.  Patient called her son again tonight saying that she was weak.  When he arrived, she was diaphoretic, confused and weak.  EMS was called.  Patient arrives in the emergency department hypothermic with rectal temp of 94.  She reports ongoing vomiting and diarrhea.  History of frequent urinary tract infections.  Used to be on prophylaxis although stopped her prophylaxis about 2 weeks ago.       Prior to Admission medications  Medication Sig Start Date End Date Taking? Authorizing Provider  acetaminophen  (TYLENOL ) 500 MG tablet Take 500 mg by mouth every 6 (six) hours as needed for mild pain or moderate pain.    [provider]  aspirin  EC 81 MG tablet Take 81 mg by mouth daily. Swallow whole.    [provider]  doxazosin  (CARDURA ) 2 MG tablet Take 1 tablet (2 mg total) by mouth at bedtime. 08/22/23   [name], MD  famotidine  (PEPCID ) 20 MG tablet TAKE 1 TABLET TWICE DAILY 12/19/23   [name], MD  glimepiride  (AMARYL ) 4 MG tablet TAKE 1  TABLET TWICE DAILY 04/25/23   [name], MD  methenamine  (HIPREX ) 1 g tablet Take 1 tablet (1 g total) by mouth 2 (two) times daily with a meal. 01/10/24   [name], MD  ondansetron  (ZOFRAN -ODT) 4 MG disintegrating tablet Take 1 tablet (4 mg total) by mouth every 8 (eight) hours as needed for nausea or vomiting. 01/10/24   [name], MD  oseltamivir  (TAMIFLU ) 75 MG capsule Take 1 capsule (75 mg total) by mouth 2 (two) times daily. 01/10/24   [name], MD  valsartan  (DIOVAN ) 320 MG tablet Take 1 tablet (320 mg total) by mouth daily. 09/05/23   [name], MD    Allergies: Penicillin g, Doxycycline hyclate, Doxycycline hyclate, Levaquin [levofloxacin], Metformin hcl, Penicillin v potassium, Shrimp [shellfish allergy], Sinutab sinus max st [phenylephrine-acetaminophen ], and Valacyclovir    Review of Systems Negative except as per HPI Updated Vital Signs BP (!) 138/51   Pulse 86   Temp 98 F (36.7 C)   Resp 19   LMP 01/18/2001   SpO2 96%   Physical Exam Vitals and nursing note reviewed.  Constitutional:      General: She is not in acute distress.    Appearance: She is well-developed. She is not diaphoretic.  HENT:     Head: Normocephalic and atraumatic.     Mouth/Throat:     Mouth: Mucous membranes are dry.  Cardiovascular:     Rate and Rhythm: Normal rate and regular rhythm.     Heart sounds: Normal heart sounds.  Pulmonary:     Effort: Pulmonary effort is normal.     Breath sounds: Wheezing present. No rhonchi.  Abdominal:     Palpations: Abdomen is soft.     Tenderness: There is no abdominal tenderness.  Musculoskeletal:     Right lower leg: No edema.     Left lower leg: No edema.  Skin:    General: Skin is warm and dry.     Findings: No erythema or rash.  Neurological:     Mental Status: She is alert and oriented to person, place, and time.  Psychiatric:        Behavior: Behavior normal.     (all labs ordered are listed, but  only abnormal results are displayed) Labs Reviewed  RESP PANEL BY RT-PCR (RSV, FLU A&B, COVID)  RVPGX2 - Abnormal; Notable for the following components:      Result Value   Influenza A by PCR POSITIVE (*)    All other components within normal limits  LIPASE, BLOOD - Abnormal; Notable for the following components:   Lipase 451 (*)    All other components within normal limits  COMPREHENSIVE METABOLIC PANEL WITH GFR - Abnormal; Notable for the following components:   Sodium 125 (*)    Potassium 3.4 (*)    Chloride 93 (*)    CO2 20 (*)    Creatinine, Ser 1.07 (*)    Calcium  7.9 (*)    Albumin 3.3 (*)    AST 61 (*)    GFR, Estimated 53 (*)    All other components within normal limits  CBC - Abnormal; Notable for the following components:   WBC 3.3 (*)    All other components within normal limits  URINALYSIS, W/ REFLEX TO CULTURE (INFECTION SUSPECTED) - Abnormal; Notable for the following components:   Color, Urine STRAW (*)    Specific Gravity, Urine 1.004 (*)    Hgb urine dipstick SMALL (*)    Protein, ur 100 (*)    Bacteria, UA RARE (*)    All other components within normal limits  CULTURE, BLOOD (ROUTINE X 2)  CULTURE, BLOOD (ROUTINE X 2)  C DIFFICILE QUICK SCREEN W PCR REFLEX    TSH  T4, FREE  PROTIME-INR  I-STAT CG4 LACTIC ACID, ED    EKG: EKG Interpretation Date/Time:  Friday January 13 2024 00:45:26 EST Ventricular Rate:  70 PR Interval:  208 QRS Duration:  82 QT Interval:  420 QTC Calculation: 453 R Axis:   -7  Text Interpretation: Sinus rhythm with Premature atrial complexes Anterior infarct , age undetermined Abnormal ECG When compared with ECG of 03-Aug-2021 08:34, PREVIOUS ECG IS PRESENT Confirmed by Jerral Meth ([id number]) on 01/13/2024 1:29:26 AM  Radiology: [facility] Chest Port 1 View Result Date: 01/13/2024 EXAM: 1 VIEW(S) XRAY OF THE CHEST 01/13/2024 02:04:43 AM COMPARISON: Portable chest from 04/15/2021. CLINICAL HISTORY: Shortness of breath. FINDINGS:  LUNGS AND PLEURA: No focal pulmonary opacity. No pleural effusion. No pneumothorax. HEART AND MEDIASTINUM: Calcification in the transverse aorta. The mediastinum is stable. Mild cardiomegaly with no evidence of CHF. BONES AND SOFT TISSUES: Mild thoracic dextroscoliosis and degenerative changes. IMPRESSION: 1. Mild cardiomegaly without evidence of CHF. Electronically signed by: Francis Quam MD 01/13/2024 02:14 AM EST RP Workstation: [id number]     .Critical Care  Performed by: Beverley Leita LABOR, PA-C Authorized by: Beverley Leita LABOR, PA-C  Critical care provider statement:    Critical care time (minutes):  30   Critical care was time spent personally by me on the following activities:  Development of treatment plan with patient or surrogate, discussions with consultants, evaluation of patient's response to treatment, examination of patient, ordering and review of laboratory studies, ordering and review of radiographic studies, ordering and performing treatments and interventions, pulse oximetry, re-evaluation of patient's condition and review of old charts    Medications Ordered in the ED  ondansetron  (ZOFRAN -ODT) disintegrating tablet 4 mg (has no administration in time range)  lactated ringers  bolus 1,000 mL (0 mLs Intravenous Stopped 01/13/24 0355)  albuterol  (PROVENTIL ) (2.5 MG/3ML) 0.083% nebulizer solution 2.5 mg (2.5 mg Nebulization Given 01/13/24 0235)    Clinical Course as of 01/13/24 0502  Fri Jan 13, 2024  0124 PA Patient Sepsis activation for hypothermia (94.1) Family reports frequent UTIS just came off ABX Family with multiple FLU A members.  Mostly GI symptoms  [CC]  0408 WBC(!): 3.3 [CC]    Clinical Course User Index [CC] Jerral Meth, MD                                 Medical Decision Making Amount and/or Complexity of Data Reviewed Labs: ordered. Decision-making details documented in ED Course. Radiology: ordered.  Risk Prescription drug  management. Decision regarding hospitalization.   This patient presents to the ED for concern of hypothermia, weakness, vomiting and diarrhea, this involves an extensive number of treatment options, and is a complaint that carries with it a high risk of complications and morbidity.  The differential diagnosis includes but not limited to sepsis, gastroenteritis, respiratory viral illness, pneumonia, electrolyte or metabolic abnormality   Co morbidities / Chronic conditions that complicate the patient evaluation  Anemia, GERD, diabetes, hypertension, endometrial cancer   Additional history obtained:  Additional history obtained from EMR External records from outside source obtained and reviewed including prior labs imaging on file.  Visit to PCP on Monday where she was negative for COVID and flu, started on Tamiflu  due to flu exposure and symptomatic.   Lab Tests:  I Ordered, and personally interpreted labs.  The pertinent results include: TSH normal.  Lactic normal.  CBC with mild leukopenia at 3.3.  CMP with notable hyponatremia with sodium of 125.  Mild hypokalemia with potassium of 3.4.  Lipase elevated at 451 without abdominal pain.  Flu positive, negative for COVID RSV.  Urinalysis with hemoglobin, protein, negative nitrates leukocytes   Imaging Studies ordered:  I ordered imaging studies including chest x-ray I independently visualized and interpreted imaging which showed mild cardiomegaly I agree with the radiologist interpretation   Cardiac Monitoring: / EKG:  The patient was maintained on a cardiac monitor.  I personally viewed and interpreted the cardiac monitored which showed an underlying rhythm of: Sinus rhythm, rate 70   Problem List / ED Course / Critical interventions / Medication management  78 year old female presenting from home with weakness, diaphoretic.  Arrives hypothermic.  Has had vomiting and diarrhea at home all week.  She is placed into the Bair hugger,  temp Foley placed and temperature improved to 98.  She is found to be flu a positive.  She is notably hyponatremic with a sodium of 125.  Wheezing somewhat improved with albuterol  neb although her O2 sats do drop into the upper 80s while sleeping and improved to 100% when she wakes up.  She  is admitted to the hospital for further management. I ordered medication including DuoNeb, IV fluids Reevaluation of the patient after these medicines showed that the patient improved I have reviewed the patients home medicines and have made adjustments as needed   Consultations Obtained:  I requested consultation with the ER attending, Dr. Jerral,  and discussed lab and imaging findings as well as pertinent plan - they recommend: Who has seen the patient agrees with plan of care Consult with Dr. Franky with [facility] hospitalist service will consult for admission, request add-on CT abdomen pelvis   Social Determinants of Health:  Lives with husband, family nearby   Test / Admission - Considered:  admit      Final diagnoses:  Hypothermia, initial encounter  Influenza A  Hyponatremia  Other acute pancreatitis, unspecified complication status    ED Discharge Orders     None          Beverley Leita LABOR, PA-C 01/13/24 0501    Beverley Leita LABOR, PA-C 01/13/24 JINNIE Jerral Meth, MD 01/14/24 0141  "

## 2024-01-13 NOTE — ED Notes (Signed)
 Floor notified patient coming up

## 2024-01-13 NOTE — Plan of Care (Signed)

## 2024-01-13 NOTE — ED Triage Notes (Addendum)
 Patient arrives EMS from home. Reports being sick all week with increased diarrhea, nausea and weakness. Patient started taking theraflu 3 days ago. Patient reports intermittent fevers. Reports her son gave her IV fluids yesterday. EMS reports expiratory wheezing in all fields and productive cough.

## 2024-01-13 NOTE — Plan of Care (Signed)
   Problem: Education: Goal: Ability to describe self-care measures that may prevent or decrease complications (Diabetes Survival Skills Education) will improve Outcome: Progressing Goal: Individualized Educational Video(s) Outcome: Progressing   Problem: Coping: Goal: Ability to adjust to condition or change in health will improve Outcome: Progressing

## 2024-01-13 NOTE — ED Notes (Signed)
 Unable to obtain temperature orally or axillary. Rectal Temp needed. Patient also attempted to give a urine sample but is incontinent to urine and stool. Advised she may need a cath to get a clean urine sample.

## 2024-01-13 NOTE — H&P (Addendum)
 " History and Physical    Patient: Meagan [name] FMW:[id number] DOB: 1945-04-27 DOA: 01/13/2024 DOS: the patient was seen and examined on 01/13/2024 PCP: [name], MD  Patient coming from: Home  Chief Complaint:  Chief Complaint  Patient presents with   Diarrhea   Cough   HPI: Meagan [name] is a 78 y.o. female with medical history significant of endometrial cancer s/p hysterectomy, HTN, DM2, and GERD who p/w hyponatremia 2/2 intractable n/v iso influenza A.  The patient reported experiencing symptoms after attending a restaurant, [place], with her family on this past Sunday, as she and a few close family members were ill. The symptoms began on Monday, with the onset described as feeling like a ton of bricks and included vomiting. The patient went to see the primary care doctor on Tuesday and was prescribed Tamiflu , which caused diarrhea; thus, she self-discontinued. When her n/v and diarrhea failed to improve the patient called her son, who is an EMS. Per son, he found his mother drenched in sweat and perseverating on the fact that she felt like she was going to die. As such, he called EMS and had pt BIBA to ED. Of note, pt did not receive the flu vaccine this year.   In the ED, pt hypothermic 94.1 degrees Farenheit, which improved to wnl with bair hugger. VSS. Labs notable for Na 125, K 3.4, Cr 1.07 (baseline), and WBC 3.3 (lactic acid 1.5). EDP requested observbation admission.    Review of Systems: As mentioned in the history of present illness. All other systems reviewed and are negative. Past Medical History:  Diagnosis Date   Anemia    hx    Anxiety    no meds   Arthritis    knees   Cancer (HCC)    endometrial cancer   Diabetes mellitus without complication (HCC)    on oral medication   GERD (gastroesophageal reflux disease)    pepcid  now/famotidine    Hypertension    Intractable nausea and vomiting 01/13/2024   SVD (spontaneous vaginal delivery)     x 1   UTI (lower urinary tract infection) 11/22/2011   started abx on 11/24/11   Past Surgical History:  Procedure Laterality Date   CHOLECYSTECTOMY     DILATATION & CURRETTAGE/HYSTEROSCOPY WITH RESECTOCOPE  11/30/2011   Procedure: DILATATION & CURETTAGE/HYSTEROSCOPY WITH RESECTOCOPE;  Surgeon: Montie SHAUNNA Chesterfield, MD;  Location: [facility];  Service: Gynecology;  Laterality: N/A;   DILATION AND CURETTAGE OF UTERUS     LYMPH NODE DISSECTION  12/14/2011   Procedure: LYMPH NODE DISSECTION;  Surgeon: Elenore A. Dodie, MD;  Location: [facility];  Service: Gynecology;  Laterality: N/A;   ROBOTIC ASSISTED TOTAL HYSTERECTOMY WITH BILATERAL SALPINGO OOPHERECTOMY  12/14/2011   Procedure: ROBOTIC ASSISTED TOTAL HYSTERECTOMY WITH BILATERAL SALPINGO OOPHORECTOMY;  Surgeon: Elenore A. Dodie, MD;  Location: [facility];  Service: Gynecology;  Laterality: N/A;   WISDOM TOOTH EXTRACTION     Social History:  reports that she has never smoked. She has never used smokeless tobacco. She reports that she does not drink alcohol and does not use drugs.  Allergies[1]  Family History  Problem Relation Age of Onset   Heart disease Mother    Heart disease Father    Diabetes Sister        decline after fall   Other Sister        old age, delcined after husband loss   Diabetes Sister        later  had fall   Cancer Paternal Aunt        started in arm reportedly    Prior to Admission medications  Medication Sig Start Date End Date Taking? Authorizing Provider  acetaminophen  (TYLENOL ) 500 MG tablet Take 500 mg by mouth every 6 (six) hours as needed for mild pain or moderate pain.    [provider]  aspirin  EC 81 MG tablet Take 81 mg by mouth daily. Swallow whole.    [provider]  doxazosin  (CARDURA ) 2 MG tablet Take 1 tablet (2 mg total) by mouth at bedtime. 08/22/23   [name], MD  famotidine  (PEPCID ) 20 MG tablet TAKE 1 TABLET TWICE DAILY 12/19/23   [name], MD  glimepiride  (AMARYL ) 4 MG  tablet TAKE 1 TABLET TWICE DAILY 04/25/23   [name], MD  methenamine  (HIPREX ) 1 g tablet Take 1 tablet (1 g total) by mouth 2 (two) times daily with a meal. 01/10/24   [name], MD  ondansetron  (ZOFRAN -ODT) 4 MG disintegrating tablet Take 1 tablet (4 mg total) by mouth every 8 (eight) hours as needed for nausea or vomiting. 01/10/24   [name], MD  oseltamivir  (TAMIFLU ) 75 MG capsule Take 1 capsule (75 mg total) by mouth 2 (two) times daily. 01/10/24   [name], MD  valsartan  (DIOVAN ) 320 MG tablet Take 1 tablet (320 mg total) by mouth daily. 09/05/23   [name], MD    Physical Exam: Vitals:   01/13/24 0600 01/13/24 0630 01/13/24 0700 01/13/24 0730  BP: (!) 148/66 (!) 149/69 (!) 140/63 (!) 148/60  Pulse: 82 78 79 86  Resp: 14 20 15 18   Temp:      TempSrc:      SpO2: 99% 98% 98% 100%   General: Alert, oriented x3, resting comfortably in no acute distress Respiratory: Lungs clear to auscultation bilaterally with normal respiratory effort; no w/r/r Cardiovascular: Regular rate and rhythm w/o m/r/g Abdomen: Soft, nontender, nondistended. Positive bowel sounds   Data Reviewed:  Lab Results  Component Value Date   WBC 3.3 (L) 01/13/2024   HGB 12.2 01/13/2024   HCT 36.3 01/13/2024   MCV 82.1 01/13/2024   PLT 152 01/13/2024   Lab Results  Component Value Date   GLUCOSE 84 01/13/2024   CALCIUM  7.9 (L) 01/13/2024   NA 125 (L) 01/13/2024   K 3.4 (L) 01/13/2024   CO2 20 (L) 01/13/2024   CL 93 (L) 01/13/2024   BUN 18 01/13/2024   CREATININE 1.07 (H) 01/13/2024   Lab Results  Component Value Date   ALT 44 01/13/2024   AST 61 (H) 01/13/2024   ALKPHOS 87 01/13/2024   BILITOT 0.3 01/13/2024   Lab Results  Component Value Date   INR 1.0 01/13/2024   INR 1.0 06/15/2021   INR 1.0 03/02/2021   Radiology: CT ABDOMEN PELVIS W CONTRAST Result Date: 01/13/2024 CLINICAL DATA:  Vomiting and diarrhea.  Elevated lipase. EXAM: CT ABDOMEN AND  PELVIS WITH CONTRAST TECHNIQUE: Multidetector CT imaging of the abdomen and pelvis was performed using the standard protocol following bolus administration of intravenous contrast. RADIATION DOSE REDUCTION: This exam was performed according to the departmental dose-optimization program which includes automated exposure control, adjustment of the mA and/or kV according to patient size and/or use of iterative reconstruction technique. CONTRAST:  75mL OMNIPAQUE  IOHEXOL  350 MG/ML SOLN COMPARISON:  08/03/2021 FINDINGS: Lower chest: Dependent atelectasis. Hepatobiliary: No suspicious focal abnormality within the liver parenchyma. Gallbladder is surgically absent.  No intrahepatic or extrahepatic biliary dilation. Pancreas: No focal mass lesion. No dilatation of the main duct. No intraparenchymal cyst. No peripancreatic edema. Spleen: No splenomegaly. No suspicious focal mass lesion. Adrenals/Urinary Tract: No adrenal nodule or mass. Right kidney unremarkable. Tiny well-defined homogeneous low-density lesion in the left kidney is too small to characterize but is statistically most likely benign and probably a cyst. No followup imaging is recommended. No evidence for hydroureter. Bladder is decompressed by Foley catheter. Mild bladder wall irregularity seen anteriorly with underdistention accentuating wall thickness in the bladder. Stomach/Bowel: Stomach is unremarkable. No gastric wall thickening. No evidence of outlet obstruction. Duodenum is normally positioned as is the ligament of Treitz. No small bowel wall thickening. No small bowel dilatation. The terminal ileum is normal. The appendix is normal. No gross colonic mass. No colonic wall thickening. Vascular/Lymphatic: There is moderate atherosclerotic calcification of the abdominal aorta without aneurysm. There is no gastrohepatic or hepatoduodenal ligament lymphadenopathy. No retroperitoneal or mesenteric lymphadenopathy. No pelvic sidewall lymphadenopathy.  Reproductive: Hysterectomy.  There is no adnexal mass. Other: Trace free fluid is seen in the cul-de-sac. Musculoskeletal: No worrisome lytic or sclerotic osseous abnormality. IMPRESSION: 1. No acute findings in the abdomen or pelvis. Specifically, no findings to explain the patient's history of abdominal pain and vomiting. 2. Mild bladder wall irregularity seen anteriorly with underdistention accentuating wall thickness in the bladder. Correlation with urinalysis recommended to exclude cystitis. 3. Trace free fluid in the cul-de-sac. 4.  Aortic Atherosclerosis (ICD10-I70.0). Electronically Signed   By: Camellia Candle M.D.   On: 01/13/2024 05:43   DG Chest Port 1 View Result Date: 01/13/2024 EXAM: 1 VIEW(S) XRAY OF THE CHEST 01/13/2024 02:04:43 AM COMPARISON: Portable chest from 04/15/2021. CLINICAL HISTORY: Shortness of breath. FINDINGS: LUNGS AND PLEURA: No focal pulmonary opacity. No pleural effusion. No pneumothorax. HEART AND MEDIASTINUM: Calcification in the transverse aorta. The mediastinum is stable. Mild cardiomegaly with no evidence of CHF. BONES AND SOFT TISSUES: Mild thoracic dextroscoliosis and degenerative changes. IMPRESSION: 1. Mild cardiomegaly without evidence of CHF. Electronically signed by: Francis Quam MD 01/13/2024 02:14 AM EST RP Workstation: [id number]    Assessment and Plan: 40F h/o endometrial cancer s/p hysterectomy, HTN, DM2, and GERD who p/w hyponatremia 2/2 intractable n/v iso influenza A.  Intractable n/v -MIVF per below -PO Zofran  ODT prn  Hyponatremia Presumed hypovolemic hyponatremia iso  - Current Na 125; goal Na increase <44mEq/d to prevent ODS - Start IVF repletion w/ NS at 100cc/h for now - BMP q6h for now - For overcorrection, administer IV desmopressin 2mcg q8h prn - If Na fails to improve/normalize w/in 24-48h, consider renal consult  Influenza A -Conservative management for now; defer Tamiflu  per pt request; Xofluza not on fomrulary  Physical  deconditioning -PT/OT eval pending  HTN -Start Irbesartan  150mg  daily (which is 1/2 pta dose; pta on valsartan  320mg  daily)  DM2 -PTA glimepiride  4mg  BID -SSI TID AC prn  GERD -PTA famotidine    Advance Care Planning:   Code Status: Full Code   Consults: N/A  Family Communication: Spouse and son  Severity of Illness: The appropriate patient status for this patient is OBSERVATION. Observation status is judged to be reasonable and necessary in order to provide the required intensity of service to ensure the patient's safety. The patient's presenting symptoms, physical exam findings, and initial radiographic and laboratory data in the context of their medical condition is felt to place them at decreased risk for further clinical deterioration. Furthermore, it is anticipated that the patient will  be medically stable for discharge from the hospital within 2 midnights of admission.    ------- I spent 55 minutes reviewing previous notes, at the bedside counseling/discussing the treatment plan, and performing clinical documentation.  Author: Marsha Ada, MD 01/13/2024 7:40 AM  For on call review [email or web address].      [1]  Allergies Allergen Reactions   Penicillin G Anaphylaxis   Doxycycline Hyclate Nausea Only   Doxycycline Hyclate Other (See Comments)   Levaquin [Levofloxacin]    Metformin Hcl     Other Reaction(s): Updet stomach, diarrhea   Penicillin V Potassium Other (See Comments)   Shrimp [Shellfish Allergy] Nausea And Vomiting   Sinutab Sinus Max St [Phenylephrine-Acetaminophen ] Swelling   Valacyclovir Other (See Comments)    Confusion   "

## 2024-01-14 ENCOUNTER — Observation Stay (HOSPITAL_COMMUNITY)

## 2024-01-14 DIAGNOSIS — Z833 Family history of diabetes mellitus: Secondary | ICD-10-CM | POA: Diagnosis not present

## 2024-01-14 DIAGNOSIS — Z8249 Family history of ischemic heart disease and other diseases of the circulatory system: Secondary | ICD-10-CM | POA: Diagnosis not present

## 2024-01-14 DIAGNOSIS — Z1152 Encounter for screening for COVID-19: Secondary | ICD-10-CM | POA: Diagnosis not present

## 2024-01-14 DIAGNOSIS — Z91013 Allergy to seafood: Secondary | ICD-10-CM | POA: Diagnosis not present

## 2024-01-14 DIAGNOSIS — Z90710 Acquired absence of both cervix and uterus: Secondary | ICD-10-CM | POA: Diagnosis not present

## 2024-01-14 DIAGNOSIS — Z888 Allergy status to other drugs, medicaments and biological substances status: Secondary | ICD-10-CM | POA: Diagnosis not present

## 2024-01-14 DIAGNOSIS — E871 Hypo-osmolality and hyponatremia: Secondary | ICD-10-CM

## 2024-01-14 DIAGNOSIS — K219 Gastro-esophageal reflux disease without esophagitis: Secondary | ICD-10-CM | POA: Diagnosis present

## 2024-01-14 DIAGNOSIS — R112 Nausea with vomiting, unspecified: Secondary | ICD-10-CM | POA: Diagnosis present

## 2024-01-14 DIAGNOSIS — J102 Influenza due to other identified influenza virus with gastrointestinal manifestations: Secondary | ICD-10-CM | POA: Diagnosis present

## 2024-01-14 DIAGNOSIS — Z8542 Personal history of malignant neoplasm of other parts of uterus: Secondary | ICD-10-CM | POA: Diagnosis not present

## 2024-01-14 DIAGNOSIS — T375X5A Adverse effect of antiviral drugs, initial encounter: Secondary | ICD-10-CM | POA: Diagnosis present

## 2024-01-14 DIAGNOSIS — N179 Acute kidney failure, unspecified: Secondary | ICD-10-CM | POA: Diagnosis present

## 2024-01-14 DIAGNOSIS — J101 Influenza due to other identified influenza virus with other respiratory manifestations: Secondary | ICD-10-CM | POA: Diagnosis present

## 2024-01-14 DIAGNOSIS — R680 Hypothermia, not associated with low environmental temperature: Secondary | ICD-10-CM | POA: Diagnosis present

## 2024-01-14 DIAGNOSIS — Z7982 Long term (current) use of aspirin: Secondary | ICD-10-CM | POA: Diagnosis not present

## 2024-01-14 DIAGNOSIS — Z7984 Long term (current) use of oral hypoglycemic drugs: Secondary | ICD-10-CM | POA: Diagnosis not present

## 2024-01-14 DIAGNOSIS — Z880 Allergy status to penicillin: Secondary | ICD-10-CM | POA: Diagnosis not present

## 2024-01-14 DIAGNOSIS — I10 Essential (primary) hypertension: Secondary | ICD-10-CM | POA: Diagnosis present

## 2024-01-14 DIAGNOSIS — E1165 Type 2 diabetes mellitus with hyperglycemia: Secondary | ICD-10-CM | POA: Diagnosis present

## 2024-01-14 LAB — CBC
HCT: 31.2 % — ABNORMAL LOW (ref 36.0–46.0)
Hemoglobin: 10.7 g/dL — ABNORMAL LOW (ref 12.0–15.0)
MCH: 28.2 pg (ref 26.0–34.0)
MCHC: 34.3 g/dL (ref 30.0–36.0)
MCV: 82.1 fL (ref 80.0–100.0)
Platelets: 175 K/uL (ref 150–400)
RBC: 3.8 MIL/uL — ABNORMAL LOW (ref 3.87–5.11)
RDW: 13 % (ref 11.5–15.5)
WBC: 3.8 K/uL — ABNORMAL LOW (ref 4.0–10.5)
nRBC: 0 % (ref 0.0–0.2)

## 2024-01-14 LAB — BASIC METABOLIC PANEL WITH GFR
Anion gap: 8 (ref 5–15)
BUN: 19 mg/dL (ref 8–23)
CO2: 24 mmol/L (ref 22–32)
Calcium: 7.7 mg/dL — ABNORMAL LOW (ref 8.9–10.3)
Chloride: 104 mmol/L (ref 98–111)
Creatinine, Ser: 1.32 mg/dL — ABNORMAL HIGH (ref 0.44–1.00)
GFR, Estimated: 41 mL/min — ABNORMAL LOW
Glucose, Bld: 126 mg/dL — ABNORMAL HIGH (ref 70–99)
Potassium: 4 mmol/L (ref 3.5–5.1)
Sodium: 135 mmol/L (ref 135–145)

## 2024-01-14 LAB — GLUCOSE, CAPILLARY
Glucose-Capillary: 113 mg/dL — ABNORMAL HIGH (ref 70–99)
Glucose-Capillary: 145 mg/dL — ABNORMAL HIGH (ref 70–99)
Glucose-Capillary: 175 mg/dL — ABNORMAL HIGH (ref 70–99)
Glucose-Capillary: 145 mg/dL — ABNORMAL HIGH (ref 70–99)

## 2024-01-14 MED ORDER — SODIUM CHLORIDE 0.9 % IV SOLN: INTRAVENOUS | Status: DC

## 2024-01-14 NOTE — TOC Transition Note (Signed)
 Transition of Care [facility]) - Discharge Note   Patient Details  Name: Meagan [name] MRN: [id number] Date of Birth: 1945/04/20  [facility]) CM/SW Contact:  Marval Gell, RN Phone Number: 01/14/2024, 3:12 PM   Clinical Narrative:     Beatris bravo w son re DC needs/ plan. Agreeable to [facility] services, no preference for provider.  Referral accepted by [facility]. Would like RW. RW to be delivered to the room by [employer].      Barriers to Discharge: Continued Medical Work up   Patient Goals and CMS Choice Patient states their goals for this hospitalization and ongoing recovery are:: to go home CMS Medicare.gov Compare Post Acute Care list provided to:: Other (Comment Required) Choice offered to / list presented to : Adult Children Raechel)      Discharge Placement                       Discharge Plan and Services Additional resources added to the After Visit Summary for     Discharge Planning Services: CM Consult Post Acute Care Choice: Durable Medical Equipment, Home Health          DME Arranged: Walker rolling DME Agency: [employer] Date DME Agency Contacted: 01/14/24 Time DME Agency Contacted: [phone] Representative spoke with at DME Agency: Yomari HH Arranged: PT, OT HH Agency: [facility] Date [facility] Agency Contacted: 01/14/24 Time HH Agency Contacted: [phone] Representative spoke with at [facility] Agency: Darleene  Social Drivers of Health (SDOH) Interventions SDOH Screenings   Food Insecurity: Patient Declined (01/13/2024)  Housing: Patient Declined (01/13/2024)  Transportation Needs: No Transportation Needs (01/13/2024)  Utilities: Not At Risk (01/13/2024)  Depression (PHQ2-9): Low Risk (01/10/2024)  Social Connections: Socially Isolated (01/13/2024)  Tobacco Use: Low Risk (01/13/2024)     Readmission Risk Interventions     No data to display

## 2024-01-14 NOTE — Care Management Obs Status (Signed)
 MEDICARE OBSERVATION STATUS NOTIFICATION   Patient Details  Name: Meagan [name] MRN: [id number] Date of Birth: Oct 10, 1945   Medicare Observation Status Notification Given:  Yes    Marval Gell, RN 01/14/2024, 3:03 PM

## 2024-01-14 NOTE — Progress Notes (Signed)
" °  °  Durable Medical Equipment  (From admission, onward)           Start     Ordered   01/14/24 1500  For home use only DME Walker rolling  Once       Question Answer Comment  Walker: With 5 Inch Wheels   Patient needs a walker to treat with the following condition Weakness      01/14/24 1459            "

## 2024-01-14 NOTE — Plan of Care (Signed)

## 2024-01-14 NOTE — Progress Notes (Addendum)
 "        [facility]                                                                               [name], is a 78 y.o. female, DOB - 01-28-1945, FMW:[id number] Admit date - 01/13/2024    Outpatient Primary MD for the patient is Katrinka, Garnette KIDD, MD  LOS - 0  days    Brief summary   Meagan [name] is a 78 y.o. female with medical history significant of endometrial cancer s/p hysterectomy, HTN, DM2, and GERD who p/w hyponatremia 2/2 intractable n/v and influenza A. She reported being on tamiflu  and after a few doses, she started having nausea, vomiting and diarrhea.     Assessment & Plan    Assessment and Plan:   Intractable nausea and vomiting  Diarrhea  Appears to have resolved after stopping tamiflu .  Symptomatic management with IV fluids and anti emetics.     Hyponatremia:  Sodium normalized with IV fluids, decreased the rate of IV fluids.  She denies any headache or dizziness.    Influenza A Patient absolutely refused tamiflu .  She denies any symptoms.    Hypertension Well controlled.   Type 2 DM With hyperglycemia.  Hemoglobin A1c is 9.6% CBG (last 3)  Recent Labs    01/13/24 2143 01/14/24 0837 01/14/24 1224  GLUCAP 173* 113* 145*   Resume SSI.    GERD Stable.    AKI Baseline creatinine less than 1. Currently creatinine at 1.3.  UA does not show any infection.  Check US  RENAL.  Continue with IV fluids.    Physical deconditioning  Therapy evaluations done.   Estimated body mass index is 31.64 kg/m as calculated from the following:   Height as of 01/10/24: 5' 2 (1.575 m).   Weight as of 01/10/24: 78.5 kg.  Code Status: full code.  DVT Prophylaxis:  enoxaparin  (LOVENOX ) injection 40 mg Start: 01/13/24 1000   Level of Care: Level of care: Med-Surg Family Communication: discussed the plan with the son.   Disposition Plan:     Remains inpatient appropriate:  discharge in am if creatinine improves..   Procedures:   None.   Consultants:   None.   Antimicrobials:   Anti-infectives (From admission, onward)    Start     Dose/Rate Route Frequency Ordered Stop   01/13/24 1000  oseltamivir  (TAMIFLU ) capsule 30 mg  Status:  Discontinued        30 mg Oral 2 times daily 01/13/24 0734 01/13/24 1050        Medications  Scheduled Meds:  aspirin  EC  81 mg Oral Daily   doxazosin   2 mg Oral QHS   enoxaparin  (LOVENOX ) injection  40 mg Subcutaneous Daily   famotidine   20 mg Oral BID   glimepiride   4 mg Oral BID WC   insulin  aspart  0-15 Units Subcutaneous TID WC   irbesartan   150 mg Oral Daily   Continuous Infusions:  sodium chloride  75 mL/hr at 01/14/24 1129   PRN Meds:.albuterol , ondansetron     Subjective:   Meagan [name] was seen and examined today.  No chest pain or sob. No dizziness , nausea or  vomiting, no abdominal pain. No diarrhea.   Objective:   Vitals:   01/14/24 0046 01/14/24 0405 01/14/24 0837 01/14/24 1225  BP: (!) 145/54 (!) 145/62 (!) 149/64 (!) 147/60  Pulse: 86 86 82 81  Resp: 18 16 17 18   Temp: 99 F (37.2 C) 98.6 F (37 C) 98 F (36.7 C) 98.7 F (37.1 C)  TempSrc: Oral Oral Oral Oral  SpO2: 94% 93% 93% 98%    Intake/Output Summary (Last 24 hours) at 01/14/2024 1549 Last data filed at 01/14/2024 1000 Gross per 24 hour  Intake 1424.93 ml  Output 2300 ml  Net -875.07 ml   There were no vitals filed for this visit.   Exam General exam: Appears calm and comfortable  Respiratory system: Clear to auscultation. Respiratory effort normal. Cardiovascular system: S1 & S2 heard, RRR.  Gastrointestinal system: Abdomen is soft bs+ Central nervous system: Alert and oriented. No focal neurological deficits. Extremities: no pedal edema.  Skin: No rashes,  Psychiatry: Mood & affect appropriate.     Data Reviewed:  I have personally reviewed following labs and imaging studies   CBC Lab Results  Component Value Date   WBC 3.8 (L) 01/14/2024   RBC 3.80 (L)  01/14/2024   HGB 10.7 (L) 01/14/2024   HCT 31.2 (L) 01/14/2024   MCV 82.1 01/14/2024   MCH 28.2 01/14/2024   PLT 175 01/14/2024   MCHC 34.3 01/14/2024   RDW 13.0 01/14/2024   LYMPHSABS 0.6 (L) 01/10/2024   MONOABS 0.7 01/10/2024   EOSABS 0.1 01/10/2024   BASOSABS 0.1 01/10/2024     Last metabolic panel Lab Results  Component Value Date   NA 135 01/14/2024   K 4.0 01/14/2024   CL 104 01/14/2024   CO2 24 01/14/2024   BUN 19 01/14/2024   CREATININE 1.32 (H) 01/14/2024   GLUCOSE 126 (H) 01/14/2024   GFRNONAA 41 (L) 01/14/2024   GFRAA 65 (L) 12/15/2011   CALCIUM  7.7 (L) 01/14/2024   PHOS 3.1 04/17/2021   PROT 6.6 01/13/2024   ALBUMIN 3.3 (L) 01/13/2024   BILITOT 0.3 01/13/2024   ALKPHOS 87 01/13/2024   AST 61 (H) 01/13/2024   ALT 44 01/13/2024   ANIONGAP 8 01/14/2024    CBG (last 3)  Recent Labs    01/13/24 2143 01/14/24 0837 01/14/24 1224  GLUCAP 173* 113* 145*      Coagulation Profile: Recent Labs  Lab 01/13/24 0224  INR 1.0     Radiology Studies: CT ABDOMEN PELVIS W CONTRAST Result Date: 01/13/2024 CLINICAL DATA:  Vomiting and diarrhea.  Elevated lipase. EXAM: CT ABDOMEN AND PELVIS WITH CONTRAST TECHNIQUE: Multidetector CT imaging of the abdomen and pelvis was performed using the standard protocol following bolus administration of intravenous contrast. RADIATION DOSE REDUCTION: This exam was performed according to the departmental dose-optimization program which includes automated exposure control, adjustment of the mA and/or kV according to patient size and/or use of iterative reconstruction technique. CONTRAST:  75mL OMNIPAQUE  IOHEXOL  350 MG/ML SOLN COMPARISON:  08/03/2021 FINDINGS: Lower chest: Dependent atelectasis. Hepatobiliary: No suspicious focal abnormality within the liver parenchyma. Gallbladder is surgically absent. No intrahepatic or extrahepatic biliary dilation. Pancreas: No focal mass lesion. No dilatation of the main duct. No intraparenchymal  cyst. No peripancreatic edema. Spleen: No splenomegaly. No suspicious focal mass lesion. Adrenals/Urinary Tract: No adrenal nodule or mass. Right kidney unremarkable. Tiny well-defined homogeneous low-density lesion in the left kidney is too small to characterize but is statistically most likely benign and probably a cyst. No followup  imaging is recommended. No evidence for hydroureter. Bladder is decompressed by Foley catheter. Mild bladder wall irregularity seen anteriorly with underdistention accentuating wall thickness in the bladder. Stomach/Bowel: Stomach is unremarkable. No gastric wall thickening. No evidence of outlet obstruction. Duodenum is normally positioned as is the ligament of Treitz. No small bowel wall thickening. No small bowel dilatation. The terminal ileum is normal. The appendix is normal. No gross colonic mass. No colonic wall thickening. Vascular/Lymphatic: There is moderate atherosclerotic calcification of the abdominal aorta without aneurysm. There is no gastrohepatic or hepatoduodenal ligament lymphadenopathy. No retroperitoneal or mesenteric lymphadenopathy. No pelvic sidewall lymphadenopathy. Reproductive: Hysterectomy.  There is no adnexal mass. Other: Trace free fluid is seen in the cul-de-sac. Musculoskeletal: No worrisome lytic or sclerotic osseous abnormality. IMPRESSION: 1. No acute findings in the abdomen or pelvis. Specifically, no findings to explain the patient's history of abdominal pain and vomiting. 2. Mild bladder wall irregularity seen anteriorly with underdistention accentuating wall thickness in the bladder. Correlation with urinalysis recommended to exclude cystitis. 3. Trace free fluid in the cul-de-sac. 4.  Aortic Atherosclerosis (ICD10-I70.0). Electronically Signed   By: Camellia Candle M.D.   On: 01/13/2024 05:43   DG Chest Port 1 View Result Date: 01/13/2024 EXAM: 1 VIEW(S) XRAY OF THE CHEST 01/13/2024 02:04:43 AM COMPARISON: Portable chest from 04/15/2021.  CLINICAL HISTORY: Shortness of breath. FINDINGS: LUNGS AND PLEURA: No focal pulmonary opacity. No pleural effusion. No pneumothorax. HEART AND MEDIASTINUM: Calcification in the transverse aorta. The mediastinum is stable. Mild cardiomegaly with no evidence of CHF. BONES AND SOFT TISSUES: Mild thoracic dextroscoliosis and degenerative changes. IMPRESSION: 1. Mild cardiomegaly without evidence of CHF. Electronically signed by: Francis Quam MD 01/13/2024 02:14 AM EST RP Workstation: [id number]       Elgie Butter M.D. [facility] 01/14/2024, 3:49 PM  Available via Epic secure chat 7am-7pm After 7 pm, please refer to night coverage provider listed on amion.    "

## 2024-01-14 NOTE — Evaluation (Signed)
 Occupational Therapy Evaluation Patient Details Name: Meagan [name] MRN: [id number] DOB: 07-19-1945 Today's Date: 01/14/2024   History of Present Illness   Pt is a 78 y.o female presenting to [facility] on 12/26 with N/V/D and weakness. CT and x-ray (-) for significant findings.  Patient positive for influenza A with hyponatremia.  PMH includes: HTN, DM2, CKD, SVD, UTI, endometrial cancer.     Clinical Impressions Pt currently at min guard to min assist for functional transfer and selfcare sit to stand.  Pt with increased balance deficits noted but declines recommendation to use RW for safety.  She does have elevated toilet and shower seat she uses as her spouse is gone for 4-5 hours per day.  Recommended use of RW for safety but pt prefers to furniture walk even with discussions of the risk.  Feel she is close to functional baseline and will return home with some assist from her spouse when available.  Son is present and acknowledeges that pt is strong willed and refuses to use RW however he would like her to.  No further acute OT needs or follow-up recommended at this time      If plan is discharge home, recommend the following:   A little help with bathing/dressing/bathroom;Assistance with cooking/housework;A little help with walking and/or transfers     Functional Status Assessment   Patient has not had a recent decline in their functional status     Equipment Recommendations   None recommended by OT      Precautions/Restrictions   Precautions Precautions: Fall Recall of Precautions/Restrictions: Impaired Restrictions Weight Bearing Restrictions Per Provider Order: No     Mobility Bed Mobility Overal bed mobility: Modified Independent                  Transfers Overall transfer level: Needs assistance Equipment used: None Transfers: Sit to/from Stand, Bed to chair/wheelchair/BSC Sit to Stand: Contact guard assist     Step pivot transfers: Min assist      General transfer comment: Pt holds objects in reach with refusal to use RW for support.      Balance Overall balance assessment: Needs assistance Sitting-balance support: No upper extremity supported Sitting balance-Leahy Scale: Good Sitting balance - Comments: Able to sit and donn slip on shoes reaching to the floor without LOB.   Standing balance support: During functional activity, No upper extremity supported Standing balance-Leahy Scale: Poor Standing balance comment: Pt needs UE support for safety with functional tasks but tends to choose to furniture walk and refuses use of RW.                           ADL either performed or assessed with clinical judgement   ADL Overall ADL's : Modified independent                                       General ADL Comments: Pt currently at modified independent for simulated selfcare tasks if she uses assistive device, however pt refuses to use RW and instead plans to continue furniture walking.  Her son is present and voices understanding and encourages RW use as well but pt will continue to refuse based on her strong will.  She has a shower seat at home, discussed briefly with son that a bench is available however it will likely not go over the edge of her  older ceramic tub.  He is hoping to get it modified to a walk-in one day.  Feel pt is close to functional baseline and does exhibit balance deficits.  She will not use assistive device at this time to decrease her risk of falling, even though it has been emphasized.  No further acute OT needs.     Vision Baseline Vision/History: 0 No visual deficits Patient Visual Report: No change from baseline Vision Assessment?: No apparent visual deficits     Perception Perception: Within Functional Limits           Pertinent Vitals/Pain Pain Assessment Pain Assessment: No/denies pain     Extremity/Trunk Assessment Upper Extremity Assessment Upper Extremity  Assessment: Overall WFL for tasks assessed   Lower Extremity Assessment Lower Extremity Assessment: Defer to PT evaluation   Cervical / Trunk Assessment Cervical / Trunk Assessment: Normal   Communication Communication Communication: No apparent difficulties   Cognition Arousal: Alert Behavior During Therapy: WFL for tasks assessed/performed Cognition: No apparent impairments             OT - Cognition Comments: Pt exhibits decreased awareness of need to use RW and refuses stating she will furniture walk.  Her son in the room confirming that she is stubborn                 Following commands: Intact       Cueing  General Comments   Cueing Techniques: Verbal cues  VSS throughout. No significant skin abnormalities noted.           Home Living Family/patient expects to be discharged to:: Private residence Living Arrangements: Spouse/significant other Available Help at Discharge: Family;Available PRN/intermittently (Husband goes to work from 5am-10am) Type of Home: House Home Access: Stairs to enter     Home Layout: Two level (Split level) Alternate Level Stairs-Number of Steps: 10 Alternate Level Stairs-Rails: Right Bathroom Shower/Tub: Chief Strategy Officer: Standard Bathroom Accessibility: No   Home Equipment: BSC/3in1;Shower seat;Grab bars - toilet;Grab bars - tub/shower;Electric scooter          Prior Functioning/Environment Prior Level of Function : Needs assist       Physical Assist : Mobility (physical) Mobility (physical): Stairs;Gait   Mobility Comments: Pt states she stays upstairs and gets herself dressed while husband is at work. When he returns, he helps her descend the stairs. Pt otherwise furniture surfs around the house, declining use of walker or cane for assistance. Husband helps her go back up the stairs at night. ADLs Comments: Pt completes ADLs independently or with light assist     AM-PAC OT 6 Clicks Daily  Activity     Outcome Measure Help from another person eating meals?: None Help from another person taking care of personal grooming?: A Little Help from another person toileting, which includes using toliet, bedpan, or urinal?: A Little Help from another person bathing (including washing, rinsing, drying)?: A Little Help from another person to put on and taking off regular upper body clothing?: A Little Help from another person to put on and taking off regular lower body clothing?: A Little 6 Click Score: 19   End of Session Equipment Utilized During Treatment: Gait belt Nurse Communication: Mobility status  Activity Tolerance: Patient tolerated treatment well Patient left: in bed;with call bell/phone within reach                   Time: 1452-1521 OT Time Calculation (min): 29 min Charges:  OT General Charges $  OT Visit: 1 Visit OT Evaluation $OT Eval Moderate Complexity: 1 Mod OT Treatments $Self Care/Home Management : 8-22 mins  Lynwood Constant, OTR/L Acute Rehabilitation Services  Office [phone] 01/14/2024

## 2024-01-14 NOTE — Evaluation (Signed)
 Physical Therapy Evaluation Patient Details Name: Meagan [name] MRN: [id number] DOB: 1945/01/26 Today's Date: 01/14/2024  History of Present Illness  Pt is a 78 y.o female presenting to [facility] on 12/26 with N/V/D and weakness. CT and x-ray (-) for significant findings. PMH includes: HTN, DM2, CKD, SVD, UTI, endometrial cancer.   Clinical Impression  Pt is currently mobilizing near her baseline, with mild limitations due to weakness and nausea. Pt has assist at home for stairs and ambulation as needed from husband at baseline. Pt's gait pattern is observed to be impulsive, quick, with anterior lean throughout, causing pt to reach out for nearby objects for support. Pt encouraged to use walker for balance support to promote safety and declines use at this time. Handheld assist x1-2 provided for short distance ambulation during session to prevent fall. Pt is modI for bed mobility and CGA for STS with good static standing balance. Pt would benefit from continued PT services focused on balance, gait control, and DME education to promote safety and independence with functional mobility. Pt okay to discharge home with level of support provided from husband, heavily encouraged to use walker for safety.         If plan is discharge home, recommend the following: A little help with walking and/or transfers;A little help with bathing/dressing/bathroom;Assistance with cooking/housework;Assist for transportation;Help with stairs or ramp for entrance   Can travel by private vehicle        Equipment Recommendations Rolling walker (2 wheels)  Recommendations for Other Services       Functional Status Assessment Patient has had a recent decline in their functional status and demonstrates the ability to make significant improvements in function in a reasonable and predictable amount of time.     Precautions / Restrictions Precautions Precautions: Fall Recall of Precautions/Restrictions:  Impaired Restrictions Weight Bearing Restrictions Per Provider Order: No      Mobility  Bed Mobility Overal bed mobility: Modified Independent             General bed mobility comments: Uses bed rails as needed to sit EOB, steady upon sitting EOB    Transfers Overall transfer level: Needs assistance Equipment used: 1 person hand held assist Transfers: Sit to/from Stand Sit to Stand: Contact guard assist, Min assist           General transfer comment: Pt slightly unsteady upon standing, holds bed rail or hand held assist for balance.    Ambulation/Gait Ambulation/Gait assistance: Min assist, Mod assist Gait Distance (Feet): 5 Feet (5'x2) Assistive device: 1 person hand held assist, 2 person hand held assist Gait Pattern/deviations: Step-through pattern, Trunk flexed (Leaning anteriorly, large step length)   Gait velocity interpretation: <1.8 ft/sec, indicate of risk for recurrent falls   General Gait Details: Pt declining walker trial to assist with balance. Pt with forward leaning posture throughout and moves quickly. Heavy cues required for safety due to IV pole management. Pt frequently reaching out to nearby objects for support. Pt's daughter-in-law states this is how she gets around. Gait pattern appears as though pt is falling and attempting to regain balance throughout. Declines additional assist.  Stairs            Wheelchair Mobility     Tilt Bed    Modified Rankin (Stroke Patients Only)       Balance Overall balance assessment: Needs assistance Sitting-balance support: No upper extremity supported Sitting balance-Leahy Scale: Good Sitting balance - Comments: No sitting balance concerns   Standing balance support:  Bilateral upper extremity supported Standing balance-Leahy Scale: Fair Standing balance comment: Pt unsteady while standing and demonstrates worsening balance when ambulating. Pt states this gait pattern is her baseline and  declines use of DME for safety. No knee buckling noted. Poor balance due to impulsive speed and forward posture.                             Pertinent Vitals/Pain Pain Assessment Pain Assessment: No/denies pain    Home Living Family/patient expects to be discharged to:: Private residence Living Arrangements: Spouse/significant other Available Help at Discharge: Family;Available PRN/intermittently (Husband goes to work from 5am-10am) Type of Home: House Home Access: Stairs to enter     Alternate Level Stairs-Number of Steps: 10 Home Layout: Two level (Split level) Home Equipment: BSC/3in1;Shower seat;Grab bars - toilet;Grab bars - tub/shower;Electric scooter      Prior Function Prior Level of Function : Needs assist       Physical Assist : Mobility (physical) Mobility (physical): Stairs;Gait   Mobility Comments: Pt states she stays upstairs and gets herself dressed while husband is at work. When he returns, he helps her descend the stairs. Pt otherwise furniture surfs around the house, declining use of walker or cane for assistance. Husband helps her go back up the stairs at night. ADLs Comments: Pt completes ADLs independently or with light assist     Extremity/Trunk Assessment   Upper Extremity Assessment Upper Extremity Assessment: Defer to OT evaluation    Lower Extremity Assessment Lower Extremity Assessment: Overall WFL for tasks assessed;Generalized weakness    Cervical / Trunk Assessment Cervical / Trunk Assessment: Normal  Communication   Communication Communication: No apparent difficulties    Cognition Arousal: Alert Behavior During Therapy: Impulsive   PT - Cognitive impairments: Safety/Judgement                         Following commands: Intact       Cueing Cueing Techniques: Verbal cues, Tactile cues, Visual cues     General Comments General comments (skin integrity, edema, etc.): VSS throughout. No significant skin  abnormalities noted.    Exercises     Assessment/Plan    PT Assessment Patient needs continued PT services  PT Problem List Decreased strength;Decreased mobility;Decreased safety awareness;Decreased coordination;Decreased knowledge of precautions;Decreased activity tolerance;Decreased balance;Decreased knowledge of use of DME       PT Treatment Interventions DME instruction;Therapeutic exercise;Gait training;Balance training;Stair training;Neuromuscular re-education;Functional mobility training;Therapeutic activities;Patient/family education    PT Goals (Current goals can be found in the Care Plan section)  Acute Rehab PT Goals Patient Stated Goal: No additional goals stated during session.    Frequency Min 1X/week     Co-evaluation               AM-PAC PT 6 Clicks Mobility  Outcome Measure Help needed turning from your back to your side while in a flat bed without using bedrails?: None Help needed moving from lying on your back to sitting on the side of a flat bed without using bedrails?: None Help needed moving to and from a bed to a chair (including a wheelchair)?: A Lot Help needed standing up from a chair using your arms (e.g., wheelchair or bedside chair)?: A Little Help needed to walk in hospital room?: A Lot Help needed climbing 3-5 steps with a railing? : A Lot 6 Click Score: 17    End of Session  Activity Tolerance: Patient tolerated treatment well Patient left: in bed;with call bell/phone within reach;with bed alarm set;with family/visitor present Nurse Communication: Mobility status PT Visit Diagnosis: Unsteadiness on feet (R26.81);Muscle weakness (generalized) (M62.81)    Time: 9088-9071 PT Time Calculation (min) (ACUTE ONLY): 17 min   Charges:   PT Evaluation $PT Eval Moderate Complexity: 1 Mod   PT General Charges $$ ACUTE PT VISIT: 1 Visit         Sabra Morel, PT, DPT  Acute Rehabilitation Services         Office: [phone]      Sabra MARLA Morel 01/14/2024, 1:22 PM

## 2024-01-15 DIAGNOSIS — E871 Hypo-osmolality and hyponatremia: Secondary | ICD-10-CM | POA: Diagnosis not present

## 2024-01-15 DIAGNOSIS — R112 Nausea with vomiting, unspecified: Secondary | ICD-10-CM | POA: Diagnosis not present

## 2024-01-15 DIAGNOSIS — J101 Influenza due to other identified influenza virus with other respiratory manifestations: Secondary | ICD-10-CM | POA: Diagnosis not present

## 2024-01-15 LAB — BASIC METABOLIC PANEL WITH GFR
Anion gap: 10 (ref 5–15)
BUN: 17 mg/dL (ref 8–23)
CO2: 21 mmol/L — ABNORMAL LOW (ref 22–32)
Calcium: 7.8 mg/dL — ABNORMAL LOW (ref 8.9–10.3)
Chloride: 105 mmol/L (ref 98–111)
Creatinine, Ser: 1.07 mg/dL — ABNORMAL HIGH (ref 0.44–1.00)
GFR, Estimated: 53 mL/min — ABNORMAL LOW
Glucose, Bld: 113 mg/dL — ABNORMAL HIGH (ref 70–99)
Potassium: 4.1 mmol/L (ref 3.5–5.1)
Sodium: 136 mmol/L (ref 135–145)

## 2024-01-15 LAB — GLUCOSE, CAPILLARY: Glucose-Capillary: 156 mg/dL — ABNORMAL HIGH (ref 70–99)

## 2024-01-15 MED ORDER — ALBUTEROL SULFATE HFA 108 (90 BASE) MCG/ACT IN AERS: 2.0000 | INHALATION_SPRAY | Freq: Four times a day (QID) | RESPIRATORY_TRACT | 2 refills | Status: AC | PRN

## 2024-01-15 NOTE — Discharge Summary (Signed)
 " Physician Discharge Summary   Patient: Meagan [name] MRN: [id number] DOB: 1945-05-28  Admit date:     01/13/2024  Discharge date: {dischdate:26783}  Discharge Physician: Elgie Butter   PCP: [name], MD   Recommendations at discharge:  {Tip this will not be part of the note when signed- Example include specific recommendations for outpatient follow-up, pending tests to follow-up on. (Optional):26781}  ***  Discharge Diagnoses: Principal Problem:   Intractable nausea and vomiting  Resolved Problems:   * No resolved hospital problems. [facility] Course: No notes on file  Assessment and Plan: No notes have been filed under this hospital service. Service: Hospitalist     {Tip this will not be part of the note when signed There is no height or weight on file to calculate BMI. , ,  (Optional):26781}  {(NOTE) Pain control PDMP Statment (Optional):26782} Consultants: *** Procedures performed: ***  Disposition: {Plan; Disposition:26390} Diet recommendation:  {Diet_Plan:26776} DISCHARGE MEDICATION: Allergies as of 01/15/2024       Reactions   Penicillin G Anaphylaxis   Doxycycline Hyclate Nausea Only   Doxycycline Hyclate Other (See Comments)   Levaquin [levofloxacin]    Metformin Hcl    Other Reaction(s): Updet stomach, diarrhea   Penicillin V Potassium Other (See Comments)   Shrimp [shellfish Allergy] Nausea And Vomiting   Sinutab Sinus [place] [facility] [place].basta ] Swelling   Valacyclovir Other (See Comments)   Confusion        Medication List     STOP taking these medications    acetaminophen  500 MG tablet Commonly known as: TYLENOL    methenamine  1 g tablet Commonly known as: HIPREX    ondansetron  4 MG disintegrating tablet Commonly known as: ZOFRAN -ODT   oseltamivir  75 MG capsule Commonly known as: TAMIFLU    valsartan  320 MG tablet Commonly known as: DIOVAN        TAKE these medications    albuterol  108 (90 Base) MCG/ACT  inhaler Commonly known as: VENTOLIN  HFA Inhale 2 puffs into the lungs every 6 (six) hours as needed for wheezing or shortness of breath.   aspirin  EC 81 MG tablet Take 81 mg by mouth daily. Swallow whole.   doxazosin  2 MG tablet Commonly known as: CARDURA  Take 1 tablet (2 mg total) by mouth at bedtime. What changed: when to take this   famotidine  20 MG tablet Commonly known as: PEPCID  TAKE 1 TABLET TWICE DAILY   glimepiride  4 MG tablet Commonly known as: AMARYL  TAKE 1 TABLET TWICE DAILY        Contact information for after-discharge care     Home Medical Care     [facility]) .   Service: Home Health Services Contact information: [address] [place]  [address] [phone]                    Discharge Exam: There were no vitals filed for this visit. ***  Condition at discharge: {DC Condition:26389}  The results of significant diagnostics from this hospitalization (including imaging, microbiology, ancillary and laboratory) are listed below for reference.   Imaging Studies: US  RENAL Result Date: 01/15/2024 CLINICAL DATA:  Acute kidney injury. EXAM: RENAL / URINARY TRACT ULTRASOUND COMPLETE COMPARISON:  CT yesterday FINDINGS: Right Kidney: Renal measurements: 11.1 x 6.1 x 6.9 cm = volume: 242 mL. Normal parenchymal echogenicity. No hydronephrosis. No visualized stone or focal lesion. Trace nonspecific perinephric fluid. Left Kidney: Renal measurements: 11 x 5.3 x 5 cm = volume: 154  mL. Normal parenchymal echogenicity. No hydronephrosis. No visualized stone. Small cyst in the upper kidney measures 1.4 cm. No evidence of solid lesion. Bladder: Partially distended. Mild bladder wall thickening may be due to nondistention. Both ureteral jets are demonstrated. Other: Incidental note of increased parenchymal echogenicity suspicious for steatosis. IMPRESSION: 1. No obstructive uropathy. 2. Left renal cyst.  No further  follow-up imaging is recommended. 3. Bladder wall thickening which may be due to nondistention. Electronically Signed   By: Andrea Gasman M.D.   On: 01/15/2024 10:41   CT ABDOMEN PELVIS W CONTRAST Result Date: 01/13/2024 CLINICAL DATA:  Vomiting and diarrhea.  Elevated lipase. EXAM: CT ABDOMEN AND PELVIS WITH CONTRAST TECHNIQUE: Multidetector CT imaging of the abdomen and pelvis was performed using the standard protocol following bolus administration of intravenous contrast. RADIATION DOSE REDUCTION: This exam was performed according to the departmental dose-optimization program which includes automated exposure control, adjustment of the mA and/or kV according to patient size and/or use of iterative reconstruction technique. CONTRAST:  75mL OMNIPAQUE  IOHEXOL  350 MG/ML SOLN COMPARISON:  08/03/2021 FINDINGS: Lower chest: Dependent atelectasis. Hepatobiliary: No suspicious focal abnormality within the liver parenchyma. Gallbladder is surgically absent. No intrahepatic or extrahepatic biliary dilation. Pancreas: No focal mass lesion. No dilatation of the main duct. No intraparenchymal cyst. No peripancreatic edema. Spleen: No splenomegaly. No suspicious focal mass lesion. Adrenals/Urinary Tract: No adrenal nodule or mass. Right kidney unremarkable. Tiny well-defined homogeneous low-density lesion in the left kidney is too small to characterize but is statistically most likely benign and probably a cyst. No followup imaging is recommended. No evidence for hydroureter. Bladder is decompressed by Foley catheter. Mild bladder wall irregularity seen anteriorly with underdistention accentuating wall thickness in the bladder. Stomach/Bowel: Stomach is unremarkable. No gastric wall thickening. No evidence of outlet obstruction. Duodenum is normally positioned as is the ligament of Treitz. No small bowel wall thickening. No small bowel dilatation. The terminal ileum is normal. The appendix is normal. No gross colonic  mass. No colonic wall thickening. Vascular/Lymphatic: There is moderate atherosclerotic calcification of the abdominal aorta without aneurysm. There is no gastrohepatic or hepatoduodenal ligament lymphadenopathy. No retroperitoneal or mesenteric lymphadenopathy. No pelvic sidewall lymphadenopathy. Reproductive: Hysterectomy.  There is no adnexal mass. Other: Trace free fluid is seen in the cul-de-sac. Musculoskeletal: No worrisome lytic or sclerotic osseous abnormality. IMPRESSION: 1. No acute findings in the abdomen or pelvis. Specifically, no findings to explain the patient's history of abdominal pain and vomiting. 2. Mild bladder wall irregularity seen anteriorly with underdistention accentuating wall thickness in the bladder. Correlation with urinalysis recommended to exclude cystitis. 3. Trace free fluid in the cul-de-sac. 4.  Aortic Atherosclerosis (ICD10-I70.0). Electronically Signed   By: Camellia Candle M.D.   On: 01/13/2024 05:43   DG Chest Port 1 View Result Date: 01/13/2024 EXAM: 1 VIEW(S) XRAY OF THE CHEST 01/13/2024 02:04:43 AM COMPARISON: Portable chest from 04/15/2021. CLINICAL HISTORY: Shortness of breath. FINDINGS: LUNGS AND PLEURA: No focal pulmonary opacity. No pleural effusion. No pneumothorax. HEART AND MEDIASTINUM: Calcification in the transverse aorta. The mediastinum is stable. Mild cardiomegaly with no evidence of CHF. BONES AND SOFT TISSUES: Mild thoracic dextroscoliosis and degenerative changes. IMPRESSION: 1. Mild cardiomegaly without evidence of CHF. Electronically signed by: Francis Quam MD 01/13/2024 02:14 AM EST RP Workstation: [id number]    Microbiology: Results for orders placed or performed during the hospital encounter of 01/13/24  Resp panel by RT-PCR (RSV, Flu A&B, Covid) Anterior Nasal Swab     Status: Abnormal   Collection  Time: 01/13/24 12:38 AM   Specimen: Anterior Nasal Swab  Result Value Ref Range Status   SARS Coronavirus 2 by RT PCR NEGATIVE NEGATIVE Final    Influenza A by PCR POSITIVE (A) NEGATIVE Final   Influenza B by PCR NEGATIVE NEGATIVE Final    Comment: (NOTE) The Xpert Xpress SARS-CoV-2/FLU/RSV plus assay is intended as an aid in the diagnosis of influenza from Nasopharyngeal swab specimens and should not be used as a sole basis for treatment. Nasal washings and aspirates are unacceptable for Xpert Xpress SARS-CoV-2/FLU/RSV testing.  Fact Sheet for Patients: bloggercourse.com  Fact Sheet for Healthcare Providers: seriousbroker.it  This test is not yet approved or cleared by the [place]  FDA and has been authorized for detection and/or diagnosis of SARS-CoV-2 by FDA under an Emergency Use Authorization (EUA). This EUA will remain in effect (meaning this test can be used) for the duration of the COVID-19 declaration under Section 564(b)(1) of the Act, 21 U.S.C. section 360bbb-3(b)(1), unless the authorization is terminated or revoked.     Resp Syncytial Virus by PCR NEGATIVE NEGATIVE Final    Comment: (NOTE) Fact Sheet for Patients: bloggercourse.com  Fact Sheet for Healthcare Providers: seriousbroker.it  This test is not yet approved or cleared by the [place]  FDA and has been authorized for detection and/or diagnosis of SARS-CoV-2 by FDA under an Emergency Use Authorization (EUA). This EUA will remain in effect (meaning this test can be used) for the duration of the COVID-19 declaration under Section 564(b)(1) of the Act, 21 U.S.C. section 360bbb-3(b)(1), unless the authorization is terminated or revoked.  Performed at [facility], [address] [place], [place] [address]   Blood Culture (routine x 2)     Status: None (Preliminary result)   Collection Time: 01/13/24  2:24 AM   Specimen: BLOOD LEFT WRIST  Result Value Ref Range Status   Specimen Description BLOOD LEFT WRIST  Final   Special  Requests   Final    BOTTLES DRAWN AEROBIC AND ANAEROBIC Blood Culture adequate volume   Culture   Final    NO GROWTH 2 DAYS Performed at [facility], [address] [place], [place] [address]    Report Status PENDING  Incomplete  Blood Culture (routine x 2)     Status: None (Preliminary result)   Collection Time: 01/13/24  6:05 PM   Specimen: BLOOD LEFT ARM  Result Value Ref Range Status   Specimen Description BLOOD LEFT ARM  Final   Special Requests   Final    BOTTLES DRAWN AEROBIC AND ANAEROBIC Blood Culture adequate volume   Culture   Final    NO GROWTH 2 DAYS Performed at [facility], [address] [place], [place] [address]    Report Status PENDING  Incomplete    Labs: CBC: Recent Labs  Lab 01/10/24 1633 01/13/24 0141 01/14/24 0551  WBC 5.7 3.3* 3.8*  NEUTROABS 4.2  --   --   HGB 12.4 12.2 10.7*  HCT 37.3 36.3 31.2*  MCV 83.7 82.1 82.1  PLT 228.0 152 175   Basic Metabolic Panel: Recent Labs  Lab 01/13/24 0045 01/13/24 1242 01/13/24 1805 01/14/24 0551 01/15/24 0522  NA 125* 132* 134* 135 136  K 3.4* 4.1 4.0 4.0 4.1  CL 93* 100 100 104 105  CO2 20* 22 25 24  21*  GLUCOSE 84 117* 211* 126* 113*  BUN 18 15 17 19 17   CREATININE 1.07* 1.13* 1.18* 1.32* 1.07*  CALCIUM  7.9*  8.0* 7.8* 7.7* 7.8*   Liver Function Tests: Recent Labs  Lab 01/10/24 1633 01/13/24 0045  AST 36 61*  ALT 35 44  ALKPHOS 89 87  BILITOT 0.5 0.3  PROT 7.2 6.6  ALBUMIN 3.7 3.3*   CBG: Recent Labs  Lab 01/14/24 0837 01/14/24 1224 01/14/24 1730 01/14/24 2130 01/15/24 1023  GLUCAP 113* 145* 175* 145* 156*    Discharge time spent: {LESS THAN/GREATER THAN:26388} 30 minutes.  Signed: Elgie Butter, MD [facility] 01/15/2024 "

## 2024-01-16 ENCOUNTER — Telehealth: Payer: Self-pay

## 2024-01-16 ENCOUNTER — Ambulatory Visit: Payer: Self-pay | Admitting: Family Medicine

## 2024-01-16 NOTE — Transitions of Care (Post Inpatient/ED Visit) (Signed)
 "  01/16/2024  Name: Meagan [name] MRN: [id number] DOB: 1945-05-16  Today's TOC FU Call Status: Today's TOC FU Call Status:: Successful TOC FU Call Completed TOC FU Call Complete Date: 01/16/24  Patient's Name and Date of Birth confirmed. Name, DOB  Transition Care Management Follow-up Telephone Call Date of Discharge: 01/15/24 Discharge Facility: [name]) Type of Discharge: Inpatient Admission Primary Inpatient Discharge Diagnosis:: Intractable nausea and vomiting; Influenza A with Nausea vomiting and diarrhea How have you been since you were released from the hospital?: Better Any questions or concerns?: No  Items Reviewed: Did you receive and understand the discharge instructions provided?: Yes Medications obtained,verified, and reconciled?: Yes (Medications Reviewed) Any new allergies since your discharge?: Yes (I think Tamiflu  caused all the symptoms) Do you have support at home?: Yes People in Home [RPT]: spouse Name of Support/Comfort Primary Source: Meagan [name], spouse; son Meagan [name] is a paramedic and his wife is a engineer, civil (consulting)  Medications Reviewed Today: Medications Reviewed Today     Reviewed by Meagan Meagan GRADE, RN (Registered Nurse) on 01/16/24 at 1149  Med List Status: <None>   Medication Order Taking? Sig Documenting Provider Last Dose Status Informant  albuterol  (VENTOLIN  HFA) 108 (90 Base) MCG/ACT inhaler [id number] Yes Inhale 2 puffs into the lungs every 6 (six) hours as needed for wheezing or shortness of breath. Meagan Labella, MD  Active            Med Note [name], Meagan [name] Kitchens Jan 16, 2024 11:48 AM) Needs to pick up from [employer] today, she says  aspirin  EC 81 MG tablet [id number] Yes Take 81 mg by mouth daily. Swallow whole. [provider]  Active Self, Pharmacy Records  doxazosin  (CARDURA ) 2 MG tablet [id number]  Take 1 tablet (2 mg total) by mouth at bedtime.  Patient taking differently: Take 2 mg by mouth daily.   [name] Meagan KIDD, MD   Active Self, Pharmacy Records  famotidine  (PEPCID ) 20 MG tablet [id number] Yes TAKE 1 TABLET TWICE DAILY [name] Meagan KIDD, MD  Active Self, Pharmacy Records  glimepiride  (AMARYL ) 4 MG tablet [id number] Yes TAKE 1 TABLET TWICE DAILY [name] Meagan KIDD, MD  Active Self, Pharmacy Records            Home Care and Equipment/Supplies: Were Home Health Services Ordered?: Yes Name of Home Health Agency:: [facility] Has Agency set up a time to come to your home?: No (I'm planning to cancel them because I don't need it I feel that much better) EMR reviewed for Home Health Orders: Orders present/patient has not received call (refer to CM for follow-up) Any new equipment or medical supplies ordered?: No  Functional Questionnaire: Do you need assistance with bathing/showering or dressing?: No Do you need assistance with meal preparation?: No Do you need assistance with eating?: No Do you have difficulty maintaining continence: No Do you need assistance with getting out of bed/getting out of a chair/moving?: No Do you have difficulty managing or taking your medications?: No  Follow up appointments reviewed: PCP Follow-up appointment confirmed?: Yes Date of PCP follow-up appointment?: 01/25/24 Follow-up Provider: Garnette Katrinka, MD Specialist Hospital Follow-up appointment confirmed?: NA Do you need transportation to your follow-up appointment?: No Do you understand care options if your condition(s) worsen?: Yes-patient verbalized understanding  SDOH Interventions Today    Flowsheet Row Most Recent Value  SDOH Interventions   Food Insecurity Interventions Intervention Not Indicated  Housing Interventions Intervention Not Indicated  Transportation Interventions Intervention Not Indicated  Utilities Interventions Intervention Not Indicated   Education for self-mgmt of Flu and symptoms prior to admission of N/V/D provided during this outreach regarding: -s/s of worsening condition  and when to seek medical attention -importance of completing all post discharge hospital hospital follow up appts -adherence to med regimen [facility] TOC 30-day program enrollment reviewed and discussed with pt/caregiver. She have declined enrollment in 30 day TOC program due to: supportive family son is a paramedic and feeling much better.   Meagan Fish, RN, BSN, CCM [facility], [facility] Management Coordinator Direct Dial: [phone]        "

## 2024-01-18 LAB — CULTURE, BLOOD (ROUTINE X 2)
Culture: NO GROWTH
Culture: NO GROWTH
Special Requests: ADEQUATE
Special Requests: ADEQUATE

## 2024-01-25 ENCOUNTER — Encounter: Payer: Self-pay | Admitting: Family Medicine

## 2024-01-25 ENCOUNTER — Ambulatory Visit: Payer: Self-pay | Admitting: Family Medicine

## 2024-01-25 ENCOUNTER — Ambulatory Visit: Admitting: Family Medicine

## 2024-01-25 VITALS — BP 128/68 | HR 82 | Temp 97.8°F | Ht 62.0 in | Wt 173.0 lb

## 2024-01-25 DIAGNOSIS — E1169 Type 2 diabetes mellitus with other specified complication: Secondary | ICD-10-CM | POA: Insufficient documentation

## 2024-01-25 DIAGNOSIS — D649 Anemia, unspecified: Secondary | ICD-10-CM

## 2024-01-25 DIAGNOSIS — R300 Dysuria: Secondary | ICD-10-CM

## 2024-01-25 DIAGNOSIS — N179 Acute kidney failure, unspecified: Secondary | ICD-10-CM

## 2024-01-25 DIAGNOSIS — I10 Essential (primary) hypertension: Secondary | ICD-10-CM

## 2024-01-25 DIAGNOSIS — J101 Influenza due to other identified influenza virus with other respiratory manifestations: Secondary | ICD-10-CM

## 2024-01-25 DIAGNOSIS — G959 Disease of spinal cord, unspecified: Secondary | ICD-10-CM

## 2024-01-25 DIAGNOSIS — R112 Nausea with vomiting, unspecified: Secondary | ICD-10-CM

## 2024-01-25 DIAGNOSIS — N1831 Chronic kidney disease, stage 3a: Secondary | ICD-10-CM

## 2024-01-25 LAB — URINALYSIS, MICROSCOPIC ONLY

## 2024-01-25 LAB — POCT URINALYSIS DIP (CLINITEK)
Bilirubin, UA: NEGATIVE
Glucose, UA: NEGATIVE mg/dL
Ketones, POC UA: NEGATIVE mg/dL
Nitrite, UA: NEGATIVE
POC PROTEIN,UA: >=300 — AB
Spec Grav, UA: 1.015
Urobilinogen, UA: 0.2 U/dL
pH, UA: 6

## 2024-01-25 MED ORDER — VALSARTAN 320 MG PO TABS: 320.0000 mg | ORAL_TABLET | Freq: Every day | ORAL | 3 refills | Status: AC

## 2024-01-25 NOTE — Assessment & Plan Note (Signed)
 Ongoing discomfort due to this noted that she tolerates to the best of her ability.  She feels acute still to avoid Tylenol  but I think it was an abundance of Tylenol  along with NyQuil

## 2024-01-25 NOTE — Patient Instructions (Addendum)
 Since your hydration is better now- continue to stay well hydrated and restart valsartan  for long term kidney health- if you get dehydrated again you can always stop/hold that medicine.   Restart methenamine  if no urinary tract infection on culture  We will let you know on urine culture within 2 days hopefully  Recommended follow up: Return in about 3 months (around 04/24/2024) for followup or sooner if needed.Schedule b4 you leave.

## 2024-01-25 NOTE — Assessment & Plan Note (Signed)
 Related to Tamiflu -no further issues off of medication-continue to monitor

## 2024-01-25 NOTE — Assessment & Plan Note (Signed)
 Diabetes poorly controlled as noted with A1c in the hospital over 9.  She only is agreeable to continue glimepiride  and wants no other options including diabetes education or referral to endocrine -As far as hyperlipidemia she declines any medication to treat this

## 2024-01-25 NOTE — Progress Notes (Signed)
 " Phone [phone]   Subjective:  Meagan [name] is a 79 y.o. year old very pleasant female patient who presents for transitional care management and hospital follow up for nausea/vomiting/diarrhea/AKI. Patient was hospitalized from 01/13/2024 to 01/15/2019. A TCM phone call was completed on 01/16/2024. Medical complexity moderate   Patient was diagnosed with influenza 01/10/2024 and started on Tamiflu .  She had severe nausea and vomiting after starting Tamiflu  and also developed diarrhea.  Her symptoms improved after stopping Tamiflu  but due to intractable nausea vomiting and diarrhea she was admitted.  She required IV fluids and antiemetics. She reports also her core temperature was lower and she had some confusion- they used bear warmer.   She had hyponatremia but this normalized with IV fluids  Her hypertension was well-controlled and she was maintained on her home regimen per her summary but then on discharge summary mentions stopping valsartan -this was likely due to prior dehydration I suspect. With microalbuminuria need to restart   Her diabetes noted poorly controlled with A1c of 9.6-sliding scale insulin  was used and resumed on home medications outpatient  Acid reflux was stable in the hospital- on Pepcid  outpateient  Patient did have acute kidney injury with creatinine up to 1.3 from baseline of 1 but improved with fluid intake.  Urinalysis showed no obvious infection.  Renal ultrasound was reassuring.  Even with creatinine of only 1 GFR is in the 50s typically  They noted stopping her methenamine  for unclear reasons and may simply be because it was held during the hospitalization  Today she reports feeling better overall. No nasuea or vomiting. As she is concern for recurrent UTI with some mild dysuria in last day or two.some increase in urinary frequency but tryign to stay hydrated/dirnking more   See problem oriented charting as well  Past Medical History-  Patient Active  Problem List   Diagnosis Date Noted   E coli bacteremia 04/17/2021    Priority: High   History of TIA (transient ischemic attack) 04/15/2021    Priority: High   Diabetes mellitus, type 2 (HCC) 02/12/2015    Priority: High   Normocytic anemia 04/16/2021    Priority: Medium    Stage 3a chronic kidney disease (HCC) 04/15/2021    Priority: Medium    Acid reflux 02/12/2015    Priority: Medium    Mixed hyperlipidemia 02/12/2015    Priority: Medium    Essential hypertension 12/08/2011    Priority: Medium    Gastroesophageal reflux disease without esophagitis 03/03/2021    Priority: Low   Seborrheic dermatitis 02/12/2015    Priority: Low   History of endometrial cancer 12/08/2011    Priority: Low   Hyperlipidemia associated with type 2 diabetes mellitus (HCC) 01/25/2024   Intractable nausea and vomiting 01/13/2024   Herpesviral infection, unspecified 01/10/2024   Hyperglycemia due to type 2 diabetes mellitus (HCC) 01/10/2024   Cervical myelopathy (HCC) 01/10/2024   Otorrhea of left ear 01/05/2023   Perforation of left tympanic membrane 08/12/2015    Medications- reviewed and updated  A medical reconciliation was performed comparing current medicines to hospital discharge medications. Current Outpatient Medications  Medication Sig Dispense Refill   albuterol  (VENTOLIN  HFA) 108 (90 Base) MCG/ACT inhaler Inhale 2 puffs into the lungs every 6 (six) hours as needed for wheezing or shortness of breath. 8 g 2   aspirin  EC 81 MG tablet Take 81 mg by mouth daily. Swallow whole.     doxazosin  (CARDURA ) 2 MG tablet Take 1 tablet (2 mg total)  by mouth at bedtime. (Patient taking differently: Take 2 mg by mouth daily.) 30 tablet 0   famotidine  (PEPCID ) 20 MG tablet TAKE 1 TABLET TWICE DAILY 180 tablet 3   glimepiride  (AMARYL ) 4 MG tablet TAKE 1 TABLET TWICE DAILY 180 tablet 3   valsartan  (DIOVAN ) 320 MG tablet Take 1 tablet (320 mg total) by mouth daily. 90 tablet 3   No current  facility-administered medications for this visit.   Objective  Objective:  BP 128/68 (BP Location: Left Arm, Patient Position: Sitting, Cuff Size: Normal)   Pulse 82   Temp 97.8 F (36.6 C) (Temporal)   Ht 5' 2 (1.575 m)   Wt 173 lb (78.5 kg)   LMP 01/18/2001   SpO2 96%   BMI 31.64 kg/m  Gen: NAD and well-appearing CV: RRR no murmurs rubs or gallops Lungs: CTAB no crackles, wheeze, rhonchi Abdomen: soft/nontender/nondistended/normal bowel sounds. No rebound or guarding.  Ext: no edema Skin: warm, dry    Results for orders placed or performed in visit on 01/25/24 (from the past 24 hours)  POCT URINALYSIS DIP (CLINITEK)     Status: Abnormal   Collection Time: 01/25/24 11:29 AM  Result Value Ref Range   Color, UA straw (A) yellow   Clarity, UA turbid (A) clear   Glucose, UA negative negative mg/dL   Bilirubin, UA negative negative   Ketones, POC UA negative negative mg/dL   Spec Grav, UA 8.984 8.989 - 1.025   Blood, UA trace-lysed (A) negative   pH, UA 6.0 5.0 - 8.0   POC PROTEIN,UA >=300 (A) negative, trace   Urobilinogen, UA 0.2 0.2 or 1.0 E.U./dL   Nitrite, UA Negative Negative   Leukocytes, UA Small (1+) (A) Negative      Assessment and Plan:   #TCM/hospital follow up   Assessment & Plan Influenza A Patient was treated for influenza A with Tamiflu  due to heightened risk factors particular age and poorly controlled diabetes but unfortunately had poor reaction of Tamiflu .  Tamiflu  is now on her allergy list.  Protracted nausea and vomiting.  She seemed to have cleared from the infection thankfully.  Reports also had hypothermia requiring Bair hugger and temperature looks appropriate today Intractable nausea and vomiting Related to Tamiflu -no further issues off of medication-continue to monitor AKI (acute kidney injury) Developed this before hospital but improved with hydration-offered to recheck renal function today but she declines.  We had held valsartan  due to  dehydration prior to hospitalization but with improved fluid status think she can restart this and it will help with her long-term risks especially with severe microalbuminuria Chronic kidney disease, stage 3a (HCC) GFR most frequently in the 50s and overall stable-continue current medication. Hyperlipidemia associated with type 2 diabetes mellitus (HCC) Diabetes poorly controlled as noted with A1c in the hospital over 9.  She only is agreeable to continue glimepiride  and wants no other options including diabetes education or referral to endocrine -As far as hyperlipidemia she declines any medication to treat this Cervical myelopathy (HCC) Ongoing discomfort due to this noted that she tolerates to the best of her ability.  She feels acute still to avoid Tylenol  but I think it was an abundance of Tylenol  along with NyQuil Dysuria History of recurrent UTIs and currently off her methenamine .  Reports mild dysuria and we will check urinalysis and urine culture-also urine microscopic with possible blood.  If no UTI would restart methenamine  and also if hematuria consider urology consult.  Strongly Primary hypertension Reasonably well-controlled on  doxazosin  alone but we need the valsartan  back on board for the microalbuminuria-I encouraged her to restart today and I think she has space for this to be tolerated Anemia, unspecified type Anemia noted in the hospital potentially dilutional given large amount of fluids she required.  Offered repeat CBC today but she wants to hold until next set of labs.  Also mildly low white blood count which will be rechecked with CBC next visit   Recommended follow up: Return in about 3 months (around 04/24/2024) for followup or sooner if needed.Schedule b4 you leave.   Lab/Order associations:   ICD-10-CM   1. Influenza A  J10.1     2. Intractable nausea and vomiting  R11.2     3. AKI (acute kidney injury)  N17.9     4. Chronic kidney disease, stage 3a (HCC)   N18.31     5. Hyperlipidemia associated with type 2 diabetes mellitus (HCC)  E11.69    E78.5     6. Cervical myelopathy (HCC)  G95.9     7. Dysuria  R30.0 POCT URINALYSIS DIP (CLINITEK)    Urine Microscopic Only    Urine Culture    CANCELED: Urine Culture    8. Primary hypertension  I10       Meds ordered this encounter  Medications   valsartan  (DIOVAN ) 320 MG tablet    Sig: Take 1 tablet (320 mg total) by mouth daily.    Dispense:  90 tablet    Refill:  3    Return precautions advised.  Garnette Lukes, MD   "

## 2024-01-27 LAB — URINE CULTURE
MICRO NUMBER:: 17437772
SPECIMEN QUALITY:: ADEQUATE

## 2024-01-27 MED ORDER — NITROFURANTOIN MONOHYD MACRO 100 MG PO CAPS: 100.0000 mg | ORAL_CAPSULE | Freq: Two times a day (BID) | ORAL | 0 refills | Status: AC

## 2024-02-03 NOTE — Telephone Encounter (Signed)
 Would you like her to restart her daily antibiotic back up for prevention?

## 2024-02-11 ENCOUNTER — Other Ambulatory Visit: Payer: Self-pay | Admitting: Family Medicine

## 2024-04-05 ENCOUNTER — Ambulatory Visit: Admitting: Family Medicine
